# Patient Record
Sex: Female | Born: 1935
Health system: Southern US, Community
[De-identification: ages and names within clinical notes are randomized; demographics above are authoritative.]

## PROBLEM LIST (undated history)

## (undated) DIAGNOSIS — E785 Hyperlipidemia, unspecified: Secondary | ICD-10-CM

## (undated) DIAGNOSIS — M67912 Unspecified disorder of synovium and tendon, left shoulder: Secondary | ICD-10-CM

## (undated) DIAGNOSIS — K643 Fourth degree hemorrhoids: Secondary | ICD-10-CM

## (undated) DIAGNOSIS — H9319 Tinnitus, unspecified ear: Secondary | ICD-10-CM

## (undated) DIAGNOSIS — I10 Essential (primary) hypertension: Secondary | ICD-10-CM

## (undated) DIAGNOSIS — Z86010 Personal history of colonic polyps: Secondary | ICD-10-CM

## (undated) DIAGNOSIS — K219 Gastro-esophageal reflux disease without esophagitis: Secondary | ICD-10-CM

## (undated) DIAGNOSIS — Z85820 Personal history of malignant melanoma of skin: Secondary | ICD-10-CM

## (undated) DIAGNOSIS — K5730 Diverticulosis of large intestine without perforation or abscess without bleeding: Secondary | ICD-10-CM

## (undated) DIAGNOSIS — Z8719 Personal history of other diseases of the digestive system: Secondary | ICD-10-CM

## (undated) DIAGNOSIS — Z974 Presence of external hearing-aid: Secondary | ICD-10-CM

## (undated) DIAGNOSIS — J302 Other seasonal allergic rhinitis: Secondary | ICD-10-CM

## (undated) DIAGNOSIS — F03918 Unspecified dementia, unspecified severity, with other behavioral disturbance: Secondary | ICD-10-CM

## (undated) DIAGNOSIS — Z9889 Other specified postprocedural states: Secondary | ICD-10-CM

## (undated) DIAGNOSIS — Z973 Presence of spectacles and contact lenses: Secondary | ICD-10-CM

## (undated) DIAGNOSIS — Z860101 Personal history of adenomatous and serrated colon polyps: Secondary | ICD-10-CM

## (undated) DIAGNOSIS — J3089 Other allergic rhinitis: Secondary | ICD-10-CM

## (undated) HISTORY — PX: COLONOSCOPY: SHX174

## (undated) HISTORY — DX: Gastro-esophageal reflux disease without esophagitis: K21.9

## (undated) HISTORY — DX: Diverticulosis of large intestine without perforation or abscess without bleeding: K57.30

## (undated) HISTORY — DX: Essential (primary) hypertension: I10

---

## 1997-10-31 ENCOUNTER — Other Ambulatory Visit: Admission: RE | Admit: 1997-10-31 | Discharge: 1997-10-31 | Payer: Self-pay | Admitting: *Deleted

## 1997-11-30 ENCOUNTER — Other Ambulatory Visit: Admission: RE | Admit: 1997-11-30 | Discharge: 1997-11-30 | Payer: Self-pay | Admitting: *Deleted

## 1998-06-19 ENCOUNTER — Encounter: Payer: Self-pay | Admitting: Internal Medicine

## 1998-06-19 ENCOUNTER — Ambulatory Visit (HOSPITAL_COMMUNITY): Admission: RE | Admit: 1998-06-19 | Discharge: 1998-06-19 | Payer: Self-pay | Admitting: Internal Medicine

## 1998-11-04 ENCOUNTER — Other Ambulatory Visit: Admission: RE | Admit: 1998-11-04 | Discharge: 1998-11-04 | Payer: Self-pay | Admitting: *Deleted

## 1999-01-22 ENCOUNTER — Encounter (INDEPENDENT_AMBULATORY_CARE_PROVIDER_SITE_OTHER): Payer: Self-pay

## 1999-01-22 ENCOUNTER — Other Ambulatory Visit: Admission: RE | Admit: 1999-01-22 | Discharge: 1999-01-22 | Payer: Self-pay | Admitting: *Deleted

## 2001-12-10 ENCOUNTER — Emergency Department (HOSPITAL_COMMUNITY): Admission: EM | Admit: 2001-12-10 | Discharge: 2001-12-11 | Payer: Self-pay | Admitting: Emergency Medicine

## 2002-03-08 ENCOUNTER — Other Ambulatory Visit: Admission: RE | Admit: 2002-03-08 | Discharge: 2002-03-08 | Payer: Self-pay | Admitting: *Deleted

## 2003-02-05 ENCOUNTER — Encounter: Admission: RE | Admit: 2003-02-05 | Discharge: 2003-03-26 | Payer: Self-pay | Admitting: Internal Medicine

## 2003-04-23 ENCOUNTER — Encounter: Admission: RE | Admit: 2003-04-23 | Discharge: 2003-04-23 | Payer: Self-pay | Admitting: Orthopedic Surgery

## 2003-05-15 ENCOUNTER — Encounter: Admission: RE | Admit: 2003-05-15 | Discharge: 2003-08-09 | Payer: Self-pay | Admitting: Orthopedic Surgery

## 2003-08-22 ENCOUNTER — Encounter: Admission: RE | Admit: 2003-08-22 | Discharge: 2003-09-21 | Payer: Self-pay | Admitting: Orthopedic Surgery

## 2003-11-26 ENCOUNTER — Ambulatory Visit: Payer: Self-pay | Admitting: Internal Medicine

## 2003-11-30 ENCOUNTER — Ambulatory Visit: Payer: Self-pay | Admitting: Internal Medicine

## 2003-12-21 ENCOUNTER — Ambulatory Visit: Payer: Self-pay | Admitting: Internal Medicine

## 2004-02-05 ENCOUNTER — Ambulatory Visit: Payer: Self-pay | Admitting: Internal Medicine

## 2004-08-27 ENCOUNTER — Ambulatory Visit: Payer: Self-pay | Admitting: Internal Medicine

## 2004-09-08 ENCOUNTER — Ambulatory Visit: Payer: Self-pay | Admitting: Internal Medicine

## 2004-09-16 ENCOUNTER — Encounter: Admission: RE | Admit: 2004-09-16 | Discharge: 2004-10-13 | Payer: Self-pay | Admitting: Internal Medicine

## 2004-11-27 ENCOUNTER — Ambulatory Visit: Payer: Self-pay | Admitting: Internal Medicine

## 2005-03-14 ENCOUNTER — Ambulatory Visit: Payer: Self-pay | Admitting: Family Medicine

## 2005-05-12 ENCOUNTER — Ambulatory Visit: Payer: Self-pay | Admitting: Internal Medicine

## 2005-05-27 ENCOUNTER — Ambulatory Visit: Payer: Self-pay | Admitting: Internal Medicine

## 2005-06-04 ENCOUNTER — Ambulatory Visit: Payer: Self-pay | Admitting: Internal Medicine

## 2005-12-15 ENCOUNTER — Ambulatory Visit: Payer: Self-pay | Admitting: Internal Medicine

## 2005-12-15 LAB — CONVERTED CEMR LAB
ALT: 25 units/L (ref 0–40)
AST: 25 units/L (ref 0–37)
Chol/HDL Ratio, serum: 2.7
Cholesterol: 156 mg/dL (ref 0–200)
Glucose, Bld: 98 mg/dL (ref 70–99)
HDL: 57 mg/dL (ref >39.0)
LDL Cholesterol: 88 mg/dL (ref 0–99)
Triglyceride fasting, serum: 54 mg/dL (ref 0–149)
VLDL: 11 mg/dL (ref 0–40)

## 2006-02-22 ENCOUNTER — Ambulatory Visit: Payer: Self-pay | Admitting: Internal Medicine

## 2006-10-11 ENCOUNTER — Telehealth (INDEPENDENT_AMBULATORY_CARE_PROVIDER_SITE_OTHER): Payer: Self-pay | Admitting: *Deleted

## 2006-10-11 ENCOUNTER — Ambulatory Visit: Payer: Self-pay | Admitting: Internal Medicine

## 2006-10-12 ENCOUNTER — Telehealth (INDEPENDENT_AMBULATORY_CARE_PROVIDER_SITE_OTHER): Payer: Self-pay | Admitting: *Deleted

## 2006-10-14 ENCOUNTER — Telehealth (INDEPENDENT_AMBULATORY_CARE_PROVIDER_SITE_OTHER): Payer: Self-pay | Admitting: *Deleted

## 2006-10-15 ENCOUNTER — Ambulatory Visit: Payer: Self-pay | Admitting: Internal Medicine

## 2006-10-20 ENCOUNTER — Telehealth (INDEPENDENT_AMBULATORY_CARE_PROVIDER_SITE_OTHER): Payer: Self-pay | Admitting: *Deleted

## 2006-10-22 ENCOUNTER — Encounter: Admission: RE | Admit: 2006-10-22 | Discharge: 2006-10-22 | Payer: Self-pay | Admitting: Internal Medicine

## 2006-10-25 ENCOUNTER — Telehealth (INDEPENDENT_AMBULATORY_CARE_PROVIDER_SITE_OTHER): Payer: Self-pay | Admitting: *Deleted

## 2006-10-25 ENCOUNTER — Encounter (INDEPENDENT_AMBULATORY_CARE_PROVIDER_SITE_OTHER): Payer: Self-pay | Admitting: *Deleted

## 2006-10-25 ENCOUNTER — Encounter: Payer: Self-pay | Admitting: Internal Medicine

## 2006-10-29 ENCOUNTER — Encounter: Payer: Self-pay | Admitting: Internal Medicine

## 2006-11-19 ENCOUNTER — Inpatient Hospital Stay (HOSPITAL_COMMUNITY): Admission: RE | Admit: 2006-11-19 | Discharge: 2006-11-21 | Payer: Self-pay | Admitting: Orthopedic Surgery

## 2006-11-19 HISTORY — PX: LUMBAR FUSION: SHX111

## 2007-02-17 ENCOUNTER — Telehealth (INDEPENDENT_AMBULATORY_CARE_PROVIDER_SITE_OTHER): Payer: Self-pay | Admitting: *Deleted

## 2007-02-18 ENCOUNTER — Ambulatory Visit: Payer: Self-pay | Admitting: Internal Medicine

## 2007-02-18 DIAGNOSIS — E785 Hyperlipidemia, unspecified: Secondary | ICD-10-CM | POA: Insufficient documentation

## 2007-02-18 LAB — CONVERTED CEMR LAB
ALT: 32 units/L (ref 0–35)
AST: 29 units/L (ref 0–37)
Glucose, Bld: 98 mg/dL (ref 70–99)
Total CHOL/HDL Ratio: 2.5

## 2007-02-22 ENCOUNTER — Ambulatory Visit: Payer: Self-pay | Admitting: Internal Medicine

## 2007-02-22 DIAGNOSIS — I10 Essential (primary) hypertension: Secondary | ICD-10-CM | POA: Insufficient documentation

## 2007-02-22 LAB — CONVERTED CEMR LAB: HDL goal, serum: 40 mg/dL

## 2007-02-23 ENCOUNTER — Telehealth (INDEPENDENT_AMBULATORY_CARE_PROVIDER_SITE_OTHER): Payer: Self-pay | Admitting: *Deleted

## 2007-04-27 ENCOUNTER — Encounter: Payer: Self-pay | Admitting: Internal Medicine

## 2007-11-16 ENCOUNTER — Ambulatory Visit: Payer: Self-pay | Admitting: Internal Medicine

## 2008-03-07 ENCOUNTER — Telehealth (INDEPENDENT_AMBULATORY_CARE_PROVIDER_SITE_OTHER): Payer: Self-pay | Admitting: *Deleted

## 2008-03-23 ENCOUNTER — Telehealth (INDEPENDENT_AMBULATORY_CARE_PROVIDER_SITE_OTHER): Payer: Self-pay | Admitting: *Deleted

## 2008-04-04 ENCOUNTER — Ambulatory Visit: Payer: Self-pay | Admitting: Internal Medicine

## 2008-04-04 DIAGNOSIS — C4360 Malignant melanoma of unspecified upper limb, including shoulder: Secondary | ICD-10-CM | POA: Insufficient documentation

## 2008-04-04 DIAGNOSIS — R0609 Other forms of dyspnea: Secondary | ICD-10-CM

## 2008-04-04 DIAGNOSIS — R0989 Other specified symptoms and signs involving the circulatory and respiratory systems: Secondary | ICD-10-CM | POA: Insufficient documentation

## 2008-04-04 DIAGNOSIS — IMO0001 Reserved for inherently not codable concepts without codable children: Secondary | ICD-10-CM | POA: Insufficient documentation

## 2008-04-06 ENCOUNTER — Ambulatory Visit: Payer: Self-pay | Admitting: Internal Medicine

## 2008-04-07 ENCOUNTER — Telehealth (INDEPENDENT_AMBULATORY_CARE_PROVIDER_SITE_OTHER): Payer: Self-pay | Admitting: *Deleted

## 2008-04-10 ENCOUNTER — Telehealth (INDEPENDENT_AMBULATORY_CARE_PROVIDER_SITE_OTHER): Payer: Self-pay | Admitting: *Deleted

## 2008-04-11 ENCOUNTER — Ambulatory Visit: Payer: Self-pay

## 2008-04-11 ENCOUNTER — Encounter: Payer: Self-pay | Admitting: Cardiology

## 2008-04-16 ENCOUNTER — Encounter (INDEPENDENT_AMBULATORY_CARE_PROVIDER_SITE_OTHER): Payer: Self-pay | Admitting: *Deleted

## 2008-04-16 LAB — CONVERTED CEMR LAB
Albumin: 4.3 g/dL (ref 3.5–5.2)
Alkaline Phosphatase: 69 units/L (ref 39–117)
Basophils Relative: 1.4 % (ref 0.0–3.0)
CO2: 33 meq/L — ABNORMAL HIGH (ref 19–32)
Chloride: 102 meq/L (ref 96–112)
Creatinine, Ser: 0.8 mg/dL (ref 0.4–1.2)
Eosinophils Relative: 2.9 % (ref 0.0–5.0)
HCT: 37.9 % (ref 36.0–46.0)
HDL: 61.5 mg/dL (ref >39.00)
Hemoglobin: 13 g/dL (ref 12.0–15.0)
LDL Cholesterol: 92 mg/dL (ref 0–99)
Lymphs Abs: 2 10*3/uL (ref 0.7–4.0)
Monocytes Relative: 11.2 % (ref 3.0–12.0)
Neutro Abs: 2.1 10*3/uL (ref 1.4–7.7)
RBC: 4.22 M/uL (ref 3.87–5.11)
Total CHOL/HDL Ratio: 3
Triglycerides: 49 mg/dL (ref 0.0–149.0)
WBC: 4.8 10*3/uL (ref 4.5–10.5)

## 2008-04-27 ENCOUNTER — Encounter: Payer: Self-pay | Admitting: Internal Medicine

## 2008-04-30 ENCOUNTER — Ambulatory Visit: Payer: Self-pay | Admitting: Internal Medicine

## 2008-05-22 ENCOUNTER — Ambulatory Visit: Payer: Self-pay | Admitting: Internal Medicine

## 2008-05-30 ENCOUNTER — Telehealth (INDEPENDENT_AMBULATORY_CARE_PROVIDER_SITE_OTHER): Payer: Self-pay | Admitting: *Deleted

## 2008-07-12 ENCOUNTER — Ambulatory Visit: Payer: Self-pay | Admitting: Internal Medicine

## 2008-07-12 DIAGNOSIS — R259 Unspecified abnormal involuntary movements: Secondary | ICD-10-CM | POA: Insufficient documentation

## 2008-07-12 DIAGNOSIS — IMO0002 Reserved for concepts with insufficient information to code with codable children: Secondary | ICD-10-CM | POA: Insufficient documentation

## 2008-08-02 ENCOUNTER — Ambulatory Visit: Payer: Self-pay | Admitting: Internal Medicine

## 2008-08-09 ENCOUNTER — Ambulatory Visit: Payer: Self-pay | Admitting: Internal Medicine

## 2008-08-09 ENCOUNTER — Encounter: Payer: Self-pay | Admitting: Internal Medicine

## 2008-08-13 ENCOUNTER — Encounter: Payer: Self-pay | Admitting: Internal Medicine

## 2008-10-29 ENCOUNTER — Ambulatory Visit: Payer: Self-pay | Admitting: Family Medicine

## 2008-10-29 DIAGNOSIS — J302 Other seasonal allergic rhinitis: Secondary | ICD-10-CM | POA: Insufficient documentation

## 2008-10-29 DIAGNOSIS — J3089 Other allergic rhinitis: Secondary | ICD-10-CM

## 2008-12-17 ENCOUNTER — Ambulatory Visit: Payer: Self-pay | Admitting: Internal Medicine

## 2008-12-17 DIAGNOSIS — D179 Benign lipomatous neoplasm, unspecified: Secondary | ICD-10-CM | POA: Insufficient documentation

## 2008-12-20 ENCOUNTER — Telehealth (INDEPENDENT_AMBULATORY_CARE_PROVIDER_SITE_OTHER): Payer: Self-pay | Admitting: *Deleted

## 2009-02-05 ENCOUNTER — Ambulatory Visit: Payer: Self-pay | Admitting: Vascular Surgery

## 2009-02-05 ENCOUNTER — Ambulatory Visit (HOSPITAL_COMMUNITY): Admission: RE | Admit: 2009-02-05 | Discharge: 2009-02-05 | Payer: Self-pay | Admitting: Orthopedic Surgery

## 2009-02-22 ENCOUNTER — Telehealth (INDEPENDENT_AMBULATORY_CARE_PROVIDER_SITE_OTHER): Payer: Self-pay | Admitting: *Deleted

## 2009-02-27 ENCOUNTER — Telehealth (INDEPENDENT_AMBULATORY_CARE_PROVIDER_SITE_OTHER): Payer: Self-pay | Admitting: *Deleted

## 2009-04-15 ENCOUNTER — Ambulatory Visit: Payer: Self-pay | Admitting: Internal Medicine

## 2009-04-16 LAB — CONVERTED CEMR LAB
Basophils Relative: 0.6 % (ref 0.0–3.0)
Eosinophils Absolute: 0.1 10*3/uL (ref 0.0–0.7)
Eosinophils Relative: 1.7 % (ref 0.0–5.0)
Hemoglobin: 11.7 g/dL — ABNORMAL LOW (ref 12.0–15.0)
MCHC: 32.1 g/dL (ref 30.0–36.0)
MCV: 92.8 fL (ref 78.0–100.0)
Monocytes Absolute: 0.4 10*3/uL (ref 0.1–1.0)
Neutro Abs: 2.5 10*3/uL (ref 1.4–7.7)
RBC: 3.91 M/uL (ref 3.87–5.11)

## 2009-04-29 ENCOUNTER — Encounter: Payer: Self-pay | Admitting: Internal Medicine

## 2009-05-06 ENCOUNTER — Ambulatory Visit: Payer: Self-pay | Admitting: Internal Medicine

## 2009-05-06 DIAGNOSIS — Z86010 Personal history of colonic polyps: Secondary | ICD-10-CM | POA: Insufficient documentation

## 2009-05-06 DIAGNOSIS — D649 Anemia, unspecified: Secondary | ICD-10-CM | POA: Insufficient documentation

## 2009-05-06 DIAGNOSIS — K5730 Diverticulosis of large intestine without perforation or abscess without bleeding: Secondary | ICD-10-CM | POA: Insufficient documentation

## 2009-05-24 ENCOUNTER — Encounter: Payer: Self-pay | Admitting: Internal Medicine

## 2009-05-28 ENCOUNTER — Ambulatory Visit: Payer: Self-pay | Admitting: Internal Medicine

## 2009-05-28 LAB — CONVERTED CEMR LAB
OCCULT 1: NEGATIVE
OCCULT 2: NEGATIVE
OCCULT 3: NEGATIVE

## 2009-05-29 ENCOUNTER — Encounter (INDEPENDENT_AMBULATORY_CARE_PROVIDER_SITE_OTHER): Payer: Self-pay | Admitting: *Deleted

## 2009-07-02 ENCOUNTER — Ambulatory Visit: Payer: Self-pay | Admitting: Internal Medicine

## 2009-07-05 ENCOUNTER — Encounter: Payer: Self-pay | Admitting: Internal Medicine

## 2009-07-08 LAB — CONVERTED CEMR LAB
Basophils Relative: 0.6 % (ref 0.0–3.0)
Eosinophils Relative: 1.8 % (ref 0.0–5.0)
Lymphocytes Relative: 37.2 % (ref 12.0–46.0)
Monocytes Absolute: 0.5 10*3/uL (ref 0.1–1.0)
Neutrophils Relative %: 48.6 % (ref 43.0–77.0)
Platelets: 242 10*3/uL (ref 150.0–400.0)
RBC: 3.99 M/uL (ref 3.87–5.11)
Vitamin B-12: 278 pg/mL (ref 211–911)
WBC: 4.2 10*3/uL — ABNORMAL LOW (ref 4.5–10.5)

## 2009-07-09 ENCOUNTER — Encounter: Payer: Self-pay | Admitting: Internal Medicine

## 2009-07-29 ENCOUNTER — Ambulatory Visit: Payer: Self-pay | Admitting: Internal Medicine

## 2009-09-09 ENCOUNTER — Telehealth (INDEPENDENT_AMBULATORY_CARE_PROVIDER_SITE_OTHER): Payer: Self-pay | Admitting: *Deleted

## 2010-01-07 ENCOUNTER — Ambulatory Visit: Payer: Self-pay | Admitting: Internal Medicine

## 2010-01-08 LAB — CONVERTED CEMR LAB
Basophils Absolute: 0 10*3/uL (ref 0.0–0.1)
Calcium: 10.2 mg/dL (ref 8.4–10.5)
Eosinophils Relative: 2 % (ref 0.0–5.0)
Folate: 14.8 ng/mL
Iron: 127 ug/dL (ref 42–145)
Lymphocytes Relative: 31.1 % (ref 12.0–46.0)
Lymphs Abs: 1.4 10*3/uL (ref 0.7–4.0)
Magnesium: 2.2 mg/dL (ref 1.5–2.5)
Monocytes Relative: 11.2 % (ref 3.0–12.0)
Neutrophils Relative %: 55.2 % (ref 43.0–77.0)
Platelets: 252 10*3/uL (ref 150.0–400.0)
Potassium: 5 meq/L (ref 3.5–5.1)
RDW: 12.6 % (ref 11.5–14.6)
Saturation Ratios: 29.6 % (ref 20.0–50.0)
Total CK: 53 units/L (ref 7–177)
Transferrin: 306.1 mg/dL (ref 212.0–360.0)
Vitamin B-12: 333 pg/mL (ref 211–911)
WBC: 4.4 10*3/uL — ABNORMAL LOW (ref 4.5–10.5)

## 2010-02-16 LAB — CONVERTED CEMR LAB
ALT: 22 units/L (ref 0–35)
AST: 23 units/L (ref 0–37)
Albumin: 4.3 g/dL (ref 3.5–5.2)
Alkaline Phosphatase: 68 units/L (ref 39–117)
Basophils Relative: 0.8 % (ref 0.0–3.0)
Bilirubin, Direct: 0 mg/dL (ref 0.0–0.3)
CO2: 31 meq/L (ref 19–32)
Calcium: 9.4 mg/dL (ref 8.4–10.5)
Eosinophils Absolute: 0.1 10*3/uL (ref 0.0–0.7)
Eosinophils Relative: 1.7 % (ref 0.0–5.0)
HDL: 60.8 mg/dL (ref >39.00)
Hemoglobin: 12 g/dL (ref 12.0–15.0)
LDL Cholesterol: 78 mg/dL (ref 0–99)
Lymphocytes Relative: 34.1 % (ref 12.0–46.0)
MCHC: 34.8 g/dL (ref 30.0–36.0)
Monocytes Relative: 7.8 % (ref 3.0–12.0)
Neutro Abs: 2 10*3/uL (ref 1.4–7.7)
RBC: 3.85 M/uL — ABNORMAL LOW (ref 3.87–5.11)
Saturation Ratios: 34.9 % (ref 20.0–50.0)
Sodium: 141 meq/L (ref 135–145)
Total CHOL/HDL Ratio: 3
Total Protein: 6.9 g/dL (ref 6.0–8.3)
WBC: 3.6 10*3/uL — ABNORMAL LOW (ref 4.5–10.5)

## 2010-02-18 NOTE — Consult Note (Signed)
Summary: [facility]  [address]   Imported By: Lanelle Bal 06/11/2009 12:26:06  _____________________________________________________________________  External Attachment:    Type:   Image     Comment:   External Document

## 2010-02-18 NOTE — Progress Notes (Signed)
Summary: B/P concerns  Phone Note Call from Patient Call back at Home Phone [phone]   Caller: Patient Summary of Call: Message left on VM: Patient would like a call to discuss B/P med and B/P readings.   I called patient:  Left message on machine for patient to return call when avaliable, Reason for call:  discuss details of her call  .Shonna Chock  February 27, 2009 2:09 PM   Follow-up for Phone Call        [facility] and corrected 5 refills to 3 refills. 5 was sent in error Follow-up by: Shonna Chock,  February 27, 2009 2:51 PM    New/Updated Medications: DIOVAN HCT 160-12.5 MG  TABS (VALSARTAN-HYDROCHLOROTHIAZIDE) 2 by mouth once daily Prescriptions: DIOVAN HCT 160-12.5 MG  TABS (VALSARTAN-HYDROCHLOROTHIAZIDE) 2 by mouth once daily  #180 x 5   Entered by:   Shonna Chock   Authorized by:   Marga Melnick MD   Signed by:   Shonna Chock on 02/27/2009   Method used:   Electronically to        [facility]* (retail)       [address]       [place], [place]  [address]       Ph: [phone]       Fax: [phone]   RxID:   [id number]

## 2010-02-18 NOTE — Letter (Signed)
Summary: Results Follow up Letter  [facility] [place]  [address] [place], [place] [address]   Phone: [phone]  Fax: [phone]    05/29/2009 MRN: [id number]  [facility] [address] [place], [place]  [address]  Dear Vicki Garcia,  The following are the results of your recent test(s):  Test         Result    Pap Smear:        Normal _____  Not Normal _____ Comments: ______________________________________________________ Cholesterol: LDL(Bad cholesterol):         Your goal is less than:         HDL (Good cholesterol):       Your goal is more than: Comments:  ______________________________________________________ Mammogram:        Normal _____  Not Normal _____ Comments:  ___________________________________________________________________ Hemoccult:        Normal __X___  Not normal _______ Comments:    _____________________________________________________________________ Other Tests:    We routinely do not discuss normal results over the telephone.  If you desire a copy of the results, or you have any questions about this information we can discuss them at your next office visit.   Sincerely,

## 2010-02-18 NOTE — Progress Notes (Signed)
Summary: Refill Request  Phone Note Refill Request Message from:  Pharmacy on [facility] Fax # [phone]  Refills Requested: Medication #1:  DIOVAN HCT 160-12.5 MG  TABS 1 by mouth once daily   Dosage confirmed as above?Dosage Confirmed   Last Refilled: 01/03/2009 Initial call taken by: Harold Barban,  February 22, 2009 9:15 AM    Prescriptions: DIOVAN HCT 160-12.5 MG  TABS (VALSARTAN-HYDROCHLOROTHIAZIDE) 1 by mouth once daily  #30 x 5   Entered by:   Shonna Chock   Authorized by:   Marga Melnick MD   Signed by:   Shonna Chock on 02/22/2009   Method used:   Electronically to        [facility]* (retail)       [address]       [place], [place]  [address]       Ph: [phone]       Fax: [phone]   RxID:   [id number]

## 2010-02-18 NOTE — Assessment & Plan Note (Signed)
Summary: CPX- JR   Vital Signs:  Patient profile:   75 year old female Height:      61 inches Weight:      121 pounds Temp:     98.0 degrees F oral Pulse rate:   80 / minute Resp:     20 per minute BP sitting:   142 / 82  (left arm)  Vitals Entered By: Jeremy Johann CMA (May 06, 2009 11:12 AM)  Comments REVIEWED MED LIST, PATIENT AGREED DOSE AND INSTRUCTION CORRECT  pt decrease diovan 160-12.5mg  to 1 tab once daily    History of Present Illness: Vicki Garcia is here for a physical ; preventive measures reviewed . All up to date except  Pneumovax & tetanus. See BP; @ home BP 120/60 -135/80.  Allergies: 1)  ! Codeine  Past History:  Past Medical History: Hyperlipidemia Hypertension Elevated Homocysteine level Skin cancer, hx of, Melanoma RUE 1987; monitored by Dr Dorinda Hill Lumbosacral Radiculopathy in context of Spinal Stenosis Colonic polyps,Diverticulosis, AVM @ colonoscopy 07/2008, Dr Marina Goodell; Stress Test negative 03/2008  Past Surgical History: Lumbar laminectomy 10/2006 for spinal stenosis, Dr Shon Baton, [facility] Rotator cuff repair 2005 Melanoma RUE 1987; G2 P1 M1; D&C X2; OD hemorrhage ,S/P injections @ [facility] Colon polypectomy 2010  Family History: Father: CAD; Mother: NHL; Siblings: bro:  HTN, skin cancer,renal  cancer , cns bleed; bro :DDD,HTN, prostate cancer; P aunt:  breast cancer ;P uncle: MI @ 36  Social History: No diet Former Smoker: quit 1971 Alcohol use-yes: socially Regular exercise: Runner, 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         Chronic tinnitus for > 10 years. Occasional non  exertional  "shooting chest pain " ; negative Stress Test 2010. Dietary induced dyspepsia responsive to TUMS or Zantac OTC.  Physical Exam  General:  well-nourished,appears younger than age; alert,appropriate and cooperative throughout examination Head:  Normocephalic and atraumatic without obvious abnormalities.  Eyes:  No corneal or conjunctival inflammation noted. Perrla. Funduscopic exam benign, without hemorrhages, exudates or papilledema.  Ears:  External ear exam shows no significant lesions or deformities.  Otoscopic examination reveals clear canals, tympanic membranes are intact bilaterally without bulging, retraction, inflammation or discharge. Hearing is grossly normal bilaterally.Some wax on R Nose:  External nasal examination shows no deformity or inflammation. Nasal mucosa are pink and moist without lesions or exudates. Mouth:  Oral mucosa and oropharynx without lesions or exudates.  Teeth in good repair. Neck:  No deformities, masses, or tenderness noted. Lungs:  Normal respiratory effort, chest expands symmetrically. Lungs are clear to auscultation, no crackles or wheezes. Heart:  Normal rate and regular rhythm. S1 and S2 normal without gallop, murmur, click, rub . S4 Abdomen:  Bowel sounds positive,abdomen soft and non-tender without masses, organomegaly or hernias noted. Aorta palpable w/o AAA Genitalia:  Dr Clearance Coots, Gyn  Msk:  No deformity or scoliosis noted of thoracic or lumbar spine.   Pulses:  R and L carotid,radial,dorsalis pedis and posterior tibial pulses are full and equal bilaterally Extremities:  No clubbing, cyanosis, edema, or deformity noted with normal full range of motion of all joints. Minor OA changes  Neurologic:  alert & oriented X3 and DTRs symmetrical and normal.   Skin:  Intact without suspicious lesions or rashes Cervical Nodes:  No lymphadenopathy noted Axillary Nodes:  No palpable lymphadenopathy Psych:  memory intact for recent and remote, normally  interactive, and good eye contact.     Impression & Recommendations:  Problem # 1:  PREVENTIVE HEALTH CARE (ICD-V70.0)  Orders: Venipuncture (75643) TLB-Lipid Panel (80061-LIPID) TLB-BMP (Basic Metabolic Panel-BMET) (80048-METABOL) TLB-CBC Platelet - w/Differential (85025-CBCD) TLB-Hepatic/Liver Function Pnl (80076-HEPATIC) TLB-TSH (Thyroid Stimulating Hormone) (84443-TSH) TLB-B12 + Folate Pnl (32951_88416-S06/TKZ) TLB-IBC Pnl (Iron/FE;Transferrin) (83550-IBC) EKG w/ Interpretation (93000)  Problem # 2:  ANEMIA, MILD (ICD-285.9)  Borderline 03/2009  Orders: Venipuncture (60109) TLB-CBC Platelet - w/Differential (85025-CBCD) TLB-B12 + Folate Pnl (32355_73220-U54/YHC) TLB-IBC Pnl (Iron/FE;Transferrin) (83550-IBC)  Problem # 3:  HYPERTENSION, ESSENTIAL NOS (ICD-401.9)  Her updated medication list for this problem includes:    Diovan Hct 160-12.5 Mg Tabs (Valsartan-hydrochlorothiazide) .Marland Kitchen... 2 by mouth once daily  Orders: Venipuncture (62376) EKG w/ Interpretation (93000)  Problem # 4:  HYPERLIPIDEMIA (ICD-272.4)  Her updated medication list for this problem includes:    Lipitor 20 Mg Tabs (Atorvastatin calcium) .Marland Kitchen... 1/2 once daily  Orders: Venipuncture (28315) TLB-Lipid Panel (80061-LIPID)  Problem # 5:  COLONIC POLYPS, HX OF (ICD-V12.72) as per Dr Marina Goodell  Problem # 6:  MELANOMA, UPPER ARM (ICD-172.6) as per Dr Mayford Knife  Complete Medication List: 1)  Lipitor 20 Mg Tabs (Atorvastatin calcium) .... 1/2 once daily 2)  Diovan Hct 160-12.5 Mg Tabs (Valsartan-hydrochlorothiazide) .... 2 by mouth once daily 3)  Lorazepam 0.5 Mg Tabs (Lorazepam) .Marland Kitchen.. 1 at bedtime prn 4)  Anusol-hc 2.5 % Crea (Hydrocortisone) .... Apply 1 x daily as needed 5)  Lyrica 75 Mg Caps (Pregabalin) .... Take 1 tab once daily at night  **samples give to patient** 6)  Fluticasone Propionate 50 Mcg/act Susp (Fluticasone propionate) .Marland Kitchen.. 1 spray  two times a day 7)  Singulair 10 Mg Tabs (Montelukast  sodium) .Marland Kitchen.. 1 once daily  Other Orders: Tdap => 75yrs IM (17616) Admin 1st Vaccine (07371)  Patient Instructions: 1)  Check your Blood Pressure regularly. If it is above: 140/90 ON AVERAGE  you should make an appointment. Prescriptions: LIPITOR 20 MG TABS (ATORVASTATIN CALCIUM) 1/2 once daily  #30 x 5   Entered and Authorized by:   Marga Melnick MD   Signed by:   Marga Melnick MD on 05/06/2009   Method used:   Print then Give to Patient   RxID:   [id number] LORAZEPAM 0.5 MG  TABS (LORAZEPAM) 1 at bedtime prn  #30 x 11   Entered and Authorized by:   Marga Melnick MD   Signed by:   Marga Melnick MD on 05/06/2009   Method used:   Print then Give to Patient   RxID:   [id number]    Immunizations Administered:  Tetanus Vaccine:    Vaccine Type: Tdap    Site: right deltoid    Mfr: GlaxoSmithKline    Dose: 0.5 ml    Route: IM    Given by: Chrae Malloy    Exp. Date: 04/13/2011    Lot #: [id number]    VIS given: 12/07/06 version given May 06, 2009.  Appended Document: CPX- JR  Laboratory Results   Urine Tests   Date/Time Reported: May 06, 2009 1:33 PM   Routine Urinalysis   Color: yellow Appearance: Clear Glucose: negative   (Normal Range: Negative) Bilirubin: negative   (Normal Range: Negative) Ketone: negative   (Normal Range: Negative) Spec. Gravity: <1.005   (Normal Range: 1.003-1.035) Blood: negative   (Normal Range:  Negative) pH: 7.0   (Normal Range: 5.0-8.0) Protein: negative   (Normal Range: Negative) Urobilinogen: negative   (Normal Range: 0-1) Nitrite: negative   (Normal Range: Negative) Leukocyte Esterace: negative   (Normal Range: Negative)    Comments: Floydene Flock  May 06, 2009 1:33 PM

## 2010-02-18 NOTE — Assessment & Plan Note (Signed)
Summary: BODY ACHE --PH   Vital Signs:  Patient profile:   75 year old female Weight:      120.8 pounds Temp:     98.1 degrees F oral Pulse rate:   80 / minute Resp:     15 per minute BP sitting:   148 / 76  (left arm) Cuff size:   regular  Vitals Entered By: Shonna Chock (July 29, 2009 12:10 PM) CC: Body Aches , URI symptoms Comments REVIEWED MED LIST, PATIENT AGREED DOSE AND INSTRUCTION CORRECT    CC:  Body Aches  and URI symptoms.  History of Present Illness: Onset as ST 07/27/2009 with myalgias / arthralgias. Rx: nasal spray, Chloraseptic , Advil Decongestant.   The patient reports nasal congestion and dry cough, but denies purulent nasal discharge and earache.  The patient denies fever, dyspnea, wheezing, vomiting, and diarrhea.  The patient also reports headache, muscle aches, and severe fatigue.  Risk factors for Strep sinusitis include bilateral facial pain & dental pain.  The patient denies the following risk factors for Strep sinusitis:  tender adenopathy.    Allergies: 1)  ! Codeine  Physical Exam  General:  in no acute distress; alert,appropriate and cooperative throughout examination Eyes:  No corneal or conjunctival inflammation noted.  Ears:  External ear exam shows no significant lesions or deformities.  Otoscopic examination reveals clear canals, tympanic membranes are intact bilaterally without bulging, retraction, inflammation or discharge. Hearing is grossly normal bilaterally. Nose:  External nasal examination shows no deformity or inflammation. Nasal mucosa are pink and moist without lesions or exudates. Septal dislocation Mouth:  Oral mucosa and oropharynx without lesions or exudates.  Teeth in good repair. Lungs:  Normal respiratory effort, chest expands symmetrically. Lungs are clear to auscultation, no crackles or wheezes. Dry cough Cervical Nodes:  No lymphadenopathy noted Axillary Nodes:  No palpable lymphadenopathy   Impression &  Recommendations:  Problem # 1:  URI (ICD-465.9)  Her updated medication list for this problem includes:    Benzonatate 100 Mg Caps (Benzonatate) .Marland Kitchen... 1 pill every 6 hrs as needed cough  Orders: Prescription Created Electronically [phone])  Problem # 2:  BRONCHITIS-ACUTE (ICD-466.0)  The following medications were removed from the medication list:    Singulair 10 Mg Tabs (Montelukast sodium) .Marland Kitchen... 1 once daily Her updated medication list for this problem includes:    Benzonatate 100 Mg Caps (Benzonatate) .Marland Kitchen... 1 pill every 6 hrs as needed cough    Azithromycin 250 Mg Tabs (Azithromycin) .Marland Kitchen... As per pack  Orders: Prescription Created Electronically [id number])  Complete Medication List: 1)  Lipitor 20 Mg Tabs (Atorvastatin calcium) .... 1/2 once daily 2)  Diovan Hct 160-12.5 Mg Tabs (Valsartan-hydrochlorothiazide) .... 2 by mouth once daily 3)  Lorazepam 0.5 Mg Tabs (Lorazepam) .Marland Kitchen.. 1 at bedtime prn 4)  Anusol-hc 2.5 % Crea (Hydrocortisone) .... Apply 1 x daily as needed 5)  Lyrica 75 Mg Caps (Pregabalin) .... Take 1 tab once daily at night  **samples give to patient** 6)  Fluticasone Propionate 50 Mcg/act Susp (Fluticasone propionate) .Marland Kitchen.. 1 spray  two times a day 7)  Benzonatate 100 Mg Caps (Benzonatate) .Marland Kitchen.. 1 pill every 6 hrs as needed cough 8)  Azithromycin 250 Mg Tabs (Azithromycin) .... As per pack  Patient Instructions: 1)  Drink as much fluid as you can tolerate for the next few days. Prescriptions: AZITHROMYCIN 250 MG TABS (AZITHROMYCIN) as per pack  #1 x 0   Entered and Authorized by:   Marga Melnick MD  Signed by:   Marga Melnick MD on 07/29/2009   Method used:   Faxed to ...       [facility]* (retail)       [address]       [place], [place]  [address]       Ph: [phone]       Fax: [phone]   RxID:   [id number] BENZONATATE 100 MG CAPS (BENZONATATE) 1 pill every 6 hrs as needed cough  #15 x 0   Entered and Authorized by:   Marga Melnick MD   Signed by:   Marga Melnick MD on 07/29/2009   Method used:   Faxed to ...       [facility]* (retail)       [address]       [place], [place]  [address]       Ph: [phone]       Fax: [phone]   RxID:   [id number]

## 2010-02-18 NOTE — Assessment & Plan Note (Signed)
Summary: COUGHING/SORE THORT/CONGESTION/KDC   Vital Signs:  Patient profile:   75 year old female Weight:      122.8 pounds BMI:     23.29 Temp:     99.1 degrees F oral Pulse rate:   72 / minute Resp:     15 per minute BP sitting:   120 / 80  (left arm) Cuff size:   regular  Vitals Entered By: Shonna Chock (April 15, 2009 10:58 AM) CC: Cough, congestion and sore throat off/on x several weeks  Comments REVIEWED MED LIST, PATIENT AGREED DOSE AND INSTRUCTION CORRECT    CC:  Cough and congestion and sore throat off/on x several weeks .  History of Present Illness: Intermittent sinus congestion with burning PNDrainage for 4 months. Rx: Advil Congestion & Claritin  with some benefit.  Allergies: 1)  ! Codeine  Review of Systems General:  Denies chills, fever, sweats, and weight loss. ENT:  Complains of nasal congestion, postnasal drainage, and sinus pressure; Frontal headache , facial pain w/o purulence. Resp:  Denies cough and sputum productive. Allergy:  Complains of itching eyes, seasonal allergies, and sneezing.  Physical Exam  General:  in no acute distress; alert,appropriate and cooperative throughout examination Eyes:  No corneal or conjunctival inflammation noted. EOMI. Perrla.  Vision grossly normal. Ears:  L ear normal.  Wax on R Nose:  External nasal examination shows no deformity or inflammation. Nasal mucosa are pink and moist without lesions or exudates. Mouth:  Oral mucosa and oropharynx without lesions or exudates.  Teeth in good repair. Lungs:  Normal respiratory effort, chest expands symmetrically. Lungs are clear to auscultation, no crackles or wheezes. Cervical Nodes:  No lymphadenopathy noted Axillary Nodes:  No palpable lymphadenopathy   Impression & Recommendations:  Problem # 1:  RHINITIS (ICD-477.9)  R/O subclinical sinusitis The following medications were removed from the medication list:    Nasonex 50 Mcg/act Susp (Mometasone furoate) .Marland Kitchen... 2  sprays each nostril once daily Her updated medication list for this problem includes:    Fluticasone Propionate 50 Mcg/act Susp (Fluticasone propionate) .Marland Kitchen... 1 spray  two times a day  Orders: Venipuncture (36644) TLB-CBC Platelet - w/Differential (85025-CBCD)  Complete Medication List: 1)  Lipitor 20 Mg Tabs (Atorvastatin calcium) .... 1/2 tab once daily- labs due for additional refills 2)  Diovan Hct 160-12.5 Mg Tabs (Valsartan-hydrochlorothiazide) .... 2 by mouth once daily 3)  Lorazepam 0.5 Mg Tabs (Lorazepam) .Marland Kitchen.. 1 at bedtime prn 4)  Anusol-hc 2.5 % Crea (Hydrocortisone) .... Apply 1 x daily as needed 5)  Gabapentin 100 Mg Caps (Gabapentin) .Marland Kitchen.. 1-3 at bedtime as needed 6)  Fluticasone Propionate 50 Mcg/act Susp (Fluticasone propionate) .Marland Kitchen.. 1 spray  two times a day 7)  Singulair 10 Mg Tabs (Montelukast sodium) .Marland Kitchen.. 1 once daily  Patient Instructions: 1)  Neti pot once daily until sinuses clear. 2)  Drink as much fluid as you can tolerate for the next few days. Fluticasone two times a day as "crossover " technique. Report pain , pus & fever as discussed. Fill Rx for Amox 500 mg three times a day if these occur. Prescriptions: SINGULAIR 10 MG TABS (MONTELUKAST SODIUM) 1 once daily  #30 x 5   Entered and Authorized by:   Marga Melnick MD   Signed by:   Marga Melnick MD on 04/15/2009   Method used:   Historical   RxID:   [id number] FLUTICASONE PROPIONATE 50 MCG/ACT SUSP (FLUTICASONE PROPIONATE) 1 spray  two times a day  #1 x  5   Entered and Authorized by:   Marga Melnick MD   Signed by:   Marga Melnick MD on 04/15/2009   Method used:   Faxed to ...       [facility]* (retail)       [address]       [place], [place]  [address]       Ph: [phone]       Fax: [phone]   RxID:   [id number]   Appended Document: Orders Update     Clinical Lists Changes  Orders: Added new Service order of Rapid Strep [id number]) -  Signed Observations: Added new observation of RAPID STREP: negative (04/15/2009 11:24)      Laboratory Results    Other Tests  Rapid Strep: negative  Dr.Hopper aware./Chrae [facility]  April 15, 2009 11:47 AM

## 2010-02-18 NOTE — Progress Notes (Signed)
Summary: Refill Request   Phone Note Refill Request Call back at Home Phone [phone] Message from:  Patient  Refills Requested: Medication #1:  gabapentin 1-2 q 8 hrs as needed pain [place]...Marland KitchenMarland KitchenFelecia Deloach CMA  September 09, 2009 4:49 PM    Follow-up for Phone Call        Left message on machine for patient to return call when avaliable, Reason for call:   Discuss refill request (not on med list) med was changed to Lyrica Follow-up by: Shonna Chock CMA,  September 09, 2009 4:51 PM  Additional Follow-up for Phone Call Additional follow up Details #1::        I spoke with patient and she said both meds work for her and she is ok with either being sent in and was a little concerned as to why Dr.Hopper changed from the Gabapentin on 05/06/2009  Dr.Hopper please advise Additional Follow-up by: Shonna Chock CMA,  September 10, 2009 8:29 AM    Additional Follow-up for Phone Call Additional follow up Details #2::    Per Dr.Hopper ok to fill Gabapentin for it will be cheaper (if it works)  I called and informed patient's husband med sent to pharmacy./Chrae [facility] CMA  September 10, 2009 1:52 PM   New/Updated Medications: GABAPENTIN 100 MG CAPS (GABAPENTIN) 1-3 by mouth at bedtime as needed Prescriptions: GABAPENTIN 100 MG CAPS (GABAPENTIN) 1-3 by mouth at bedtime as needed  #90 x 5   Entered by:   Shonna Chock CMA   Authorized by:   Marga Melnick MD   Signed by:   Shonna Chock CMA on 09/10/2009   Method used:   Electronically to        [facility]* (retail)       [address]       [place], [place]  [address]       Ph: [phone]       Fax: [phone]   RxID:   [id number]

## 2010-02-20 NOTE — Assessment & Plan Note (Signed)
Summary: DISCUSS MED CHANGE/LABS NEEDED/KB   Vital Signs:  Patient profile:   75 year old female Weight:      118.8 pounds BMI:     22.53 Temp:     98.0 degrees F oral Pulse rate:   72 / minute Resp:     14 per minute BP sitting:   112 / 78  (left arm) Cuff size:   large  Vitals Entered By: Shonna Chock CMA (January 07, 2010 10:28 AM) CC: 1.) Discuss med(Gabapentin) and refill Anusol   2.) Labs due, Lower Extremity Joint pain   CC:  1.) Discuss med(Gabapentin) and refill Anusol   2.) Labs due and Lower Extremity Joint pain.  History of Present Illness:      This is a 75 year old woman who presents with Lower Extremity "twitching "  The patient denies swelling, redness, painor weakness.  The pain is located in the left calf > R.It is  intermittent  and occuring at rest, mainly overnight.No definite RLS symptoms.  Gabapentin even titrated up to 300 mg at bedtime not helping, initially it did help.PMH of Spinal Stenosis; symptoms began after surgery.  Current Medications (verified): 1)  Lipitor 20 Mg Tabs (Atorvastatin Calcium) .... 1/2 Once Daily 2)  Diovan Hct 160-12.5 Mg  Tabs (Valsartan-Hydrochlorothiazide) .... 2 By Mouth Once Daily 3)  Lorazepam 0.5 Mg  Tabs (Lorazepam) .Marland Kitchen.. 1 At Bedtime Prn 4)  Anusol-Hc 2.5 % Crea (Hydrocortisone) .... Apply 1 X Daily As Needed 5)  Gabapentin 100 Mg Caps (Gabapentin) .Marland Kitchen.. 1-3 By Mouth At Bedtime As Needed 6)  Fluticasone Propionate 50 Mcg/act Susp (Fluticasone Propionate) .Marland Kitchen.. 1 Spray  Two Times A Day  Allergies: 1)  ! Codeine  Review of Systems CV:  Denies leg cramps with exertion.  Physical Exam  General:  in no acute distress; alert,appropriate and cooperative throughout examination Abdomen:  Bowel sounds positive,abdomen soft and non-tender without masses, organomegaly or hernias noted. Pulses:  R and L dorsalis pedis and posterior tibial pulses are  decreased but equal bilaterally. No ischemic changes Extremities:  No clubbing,  cyanosis, edema. Neg Homan's Neurologic:  alert & oriented X3, strength normal in  lower extremities, and DTRs symmetrical and normal.Neg SLR to 90  degrees   Skin:  Intact without suspicious lesions or rashes Cervical Nodes:  No lymphadenopathy noted Axillary Nodes:  No palpable lymphadenopathy   Impression & Recommendations:  Problem # 1:  TWITCHING (ICD-781.0)  Orders: Venipuncture (21308) TLB-Calcium (82310-CA) TLB-Potassium (K+) (84132-K) TLB-Magnesium (Mg) (83735-MG) TLB-CK Total Only(Creatine Kinase/CPK) (82550-CK)  Problem # 2:  ANEMIA, MILD (ICD-285.9)  Orders: Venipuncture (65784) TLB-B12 + Folate Pnl (69629_52841-L24/MWN) TLB-IBC Pnl (Iron/FE;Transferrin) (83550-IBC) TLB-CBC Platelet - w/Differential (85025-CBCD)  Complete Medication List: 1)  Lipitor 20 Mg Tabs (Atorvastatin calcium) .... 1/2 once daily 2)  Diovan Hct 160-12.5 Mg Tabs (Valsartan-hydrochlorothiazide) .... 2 by mouth once daily 3)  Anusol-hc 2.5 % Crea (Hydrocortisone) .... Apply 1 x daily as needed 4)  Gabapentin 100 Mg Caps (Gabapentin) .Marland Kitchen.. 1-3 by mouth at bedtime as needed 5)  Fluticasone Propionate 50 Mcg/act Susp (Fluticasone propionate) .Marland Kitchen.. 1 spray  two times a day 6)  Clonazepam 0.5 Mg Tabs (Clonazepam) .Marland Kitchen.. 1-2 at bedtime as needed  Patient Instructions: 1)  assess response of muscle symptoms  to Clonazepam trial Prescriptions: CLONAZEPAM 0.5 MG TABS (CLONAZEPAM) 1-2 at bedtime as needed  #30 x 2   Entered and Authorized by:   Marga Melnick MD   Signed by:   Marga Melnick MD  on 01/07/2010   Method used:   Print then Give to Patient   RxID:   [id number]    Orders Added: 1)  Venipuncture [36415] 2)  TLB-B12 + Folate Pnl [82746_82607-B12/FOL] 3)  TLB-IBC Pnl (Iron/FE;Transferrin) [83550-IBC] 4)  TLB-CBC Platelet - w/Differential [85025-CBCD] 5)  TLB-Calcium [82310-CA] 6)  TLB-Potassium (K+) [84132-K] 7)  TLB-Magnesium (Mg) [83735-MG] 8)  TLB-CK Total Only(Creatine  Kinase/CPK) [82550-CK] 9)  Est. Patient Level III [13244]  Appended Document: DISCUSS MED CHANGE/LABS NEEDED/KB   Immunizations Administered:  Influenza Vaccine # 1:    Vaccine Type: Fluvax MCR    Site: right deltoid    Mfr: Sanofi Pasteur    Dose: 0.5 ml    Route: IM    Given by: Shonna Chock CMA    Exp. Date: 07/19/2010    Lot #: [id number]   Appended Document: DISCUSS MED CHANGE/LABS NEEDED/KB

## 2010-03-04 ENCOUNTER — Encounter: Payer: Self-pay | Admitting: Internal Medicine

## 2010-03-17 ENCOUNTER — Telehealth: Payer: Self-pay | Admitting: Internal Medicine

## 2010-03-27 NOTE — Medication Information (Signed)
Summary: Exception Request for Diovan Hct  Exception Request for Diovan Hct   Imported By: Maryln Gottron 03/18/2010 09:58:09  _____________________________________________________________________  External Attachment:    Type:   Image     Comment:   External Document

## 2010-03-27 NOTE — Progress Notes (Signed)
Summary: Leg twitching  Phone Note Call from Patient Call back at Home Phone [phone]   Summary of Call: Patient called noting that she has a problem with her leg twitching once weekly with the Clonazepam given by MD. She notes that things are ok, but it is not totally taking care of the problem.  Any recommendations?  Patient also asked about the coverage of her Diovan. She notes that prior authorization was needed previously for this prescription. I made her aware that pharmacy notifies me of rejected claim if prior authorization is needed. She is aware not to worry as long as it is filling fine at the pharmacy.  Initial call taken by: Lucious Groves CMA,  March 17, 2010 4:06 PM  Follow-up for Phone Call        if Clonazepam 1-2  at bedtime not controlloing leg symptoms, add Cal/Mag ( calcium & magnesium) each evening as needed  Follow-up by: Marga Melnick MD,  March 17, 2010 5:24 PM  Additional Follow-up for Phone Call Additional follow up Details #1::        Patient notified. Additional Follow-up by: Lucious Groves CMA,  March 18, 2010 8:37 AM

## 2010-04-10 ENCOUNTER — Other Ambulatory Visit: Payer: Self-pay | Admitting: Internal Medicine

## 2010-04-10 NOTE — Telephone Encounter (Signed)
OK X 3 months 

## 2010-04-10 NOTE — Telephone Encounter (Signed)
Renew x3 months

## 2010-05-12 ENCOUNTER — Other Ambulatory Visit: Payer: Self-pay | Admitting: *Deleted

## 2010-05-12 MED ORDER — ATORVASTATIN CALCIUM 20 MG PO TABS: ORAL_TABLET | ORAL | Status: DC

## 2010-05-12 NOTE — Telephone Encounter (Signed)
Lipid/Hep 272.4/995.20  

## 2010-05-14 ENCOUNTER — Telehealth: Payer: Self-pay | Admitting: *Deleted

## 2010-05-14 NOTE — Telephone Encounter (Signed)
There will be no difference in the generic Lipitor other than the  coating. If there is a significant  cost difference, I definitely would get with the generic.

## 2010-05-14 NOTE — Telephone Encounter (Signed)
Spoke w/ pt aware of information.  

## 2010-05-14 NOTE — Telephone Encounter (Signed)
Pt called noting that she received notice from her ins company that beginning 06/20/10 she will need to take Generic Lipitor. Pt would like to know if this is ok or is there any reason why she would need to take the brand name. Please advise.

## 2010-05-16 ENCOUNTER — Encounter: Payer: Self-pay | Admitting: Internal Medicine

## 2010-05-29 ENCOUNTER — Telehealth: Payer: Self-pay | Admitting: *Deleted

## 2010-05-29 NOTE — Telephone Encounter (Signed)
Spoke w/ pt says she is still w/ leg twitching at bedtime is almost happening every night was recommended to add cal/mag previously and has been doing this w/ no relief. Also is due for fasting labs please provide order.

## 2010-05-30 MED ORDER — CLONAZEPAM 0.5 MG PO TABS: 1.0000 mg | ORAL_TABLET | Freq: Every day | ORAL | Status: DC

## 2010-05-30 MED ORDER — CLONAZEPAM 1 MG PO TABS: 1.0000 mg | ORAL_TABLET | Freq: Every day | ORAL | Status: DC

## 2010-05-30 NOTE — Telephone Encounter (Signed)
Per Hop increase Clonazepam 1 mg at bedtime rx sent to pharmacy.

## 2010-06-03 NOTE — Op Note (Signed)
NAME:  JAYLIANI, Vicki Garcia                ACCOUNT NO.:  [id number]   MEDICAL RECORD NO.:  [id number]          PATIENT TYPE:  INP   LOCATION:  2899                         FACILITY:  [facility]   PHYSICIAN:  Alvy Beal, MD    DATE OF BIRTH:  1935/02/27   DATE OF PROCEDURE:  11/19/2006  DATE OF DISCHARGE:                               OPERATIVE REPORT   HISTORY:  She is a very pleasant 75 year old woman with complaints of  significant back and bilateral leg and buttock pain, left side worse  than the right.  Preoperative clinical and radiographic analysis  confirmed the diagnosis of symptomatic lumbar spinal stenosis at L4-5  with neurogenic claudication.  Attempts at conservative management in  the past have failed to alleviate her symptoms and so she wished to  proceed with surgery.  All appropriate risks, benefits and alternatives  to surgery were discussed with the patient and consent was obtained.   OPERATIVE NOTE:  The patient is brought to the operating room, placed  supine on the operating table.  After successful induction of general  anesthesia and endotracheal intubation, TEDs, SCDs and Foley were  applied.  She was turned prone onto a Wilson frame.  The arms were  placed overhead and all bony prominences were well-padded.   The back was prepped and draped in a standard fashion.  Two 18-gauge  needles were placed in the lumbar spine and initial incisional x-ray was  taken.  When we confirmed the L4 and L5 spinous process locations, I  then made an incision in the midline of the posterior spine.  Sharp  dissection was carried out down to and through the deep fascia.  Using a  Cobb elevator, I stripped the paraspinal muscles off of the L4 and L5  spinous processes to expose the 4-5 facet complex.  Care was taken not  to violate the facet capsule, nor did I require lateral dissection to  it.  Once we had bilateral exposure of the 4-5 interbody space, I then  placed a Penfield-4  underneath the lamina of L4 and took an x-ray.  At  this point I was able to confirm that I was at the appropriate level.  At this point using a Leksell rongeur I resected the majority of the L4  spinous process and a portion of that of L5.  I then began dissecting  down and resecting the lamina using a 3-mm Kerrison.  I then developed a  plane through the central raphe of the ligamentum flavum, between the  ligamentum flavum and the underlying dura.  I resected centrally the  ligamentum flavum.  There was significant canal compromise due to the  thickening of the ligamentum flavum.  Once I had a central  decompression, I then developed a plane with a Penfield-4 between dura  and overlying thickened/ buckled ligamentum flavum in the lateral  recess.  I then resected this with a combination of 2 and 3-mm  Kerrisons.  At this point I was able to visualize the inferior portion  of the L4 pedicle.  I then proceeded down the  lateral recess towards the  L5 pedicle.  I identified the L5 nerve and the L5 pedicle.  At this  point I had a complete lateral recess decompression from the L4 neural  foramen to the L5 neural foramen.  This encompassed the area of maximal  spinal stenosis on her preoperative MRI.  I then went to the  contralateral side and did a similar decompression on the other side.  At this point I had an excellent decompression of the central and  lateral recess and neural foramen.  I was able to take a [facility] and pass it superiorly in the central region, inferiorly in the  central region, and then along the lateral gutter and out the L5 neural  foramen bilaterally, and up to the L4 neural foramen bilaterally without  difficulty.  I then used a bipolar electrocautery to obtain hemostasis  and maintained it using FloSeal.  I then closed the wound with  interrupted #1 Vicryl sutures, 2-0 Vicryl sutures, and a 3-0 Monocryl.  At the end of the case all  needle and sponge  counts were correct.  Dry dressing was applied.  The  patient was extubated and transferred to the PACU without incident.  It  should be noted that we had satisfactory decompression based on the  ability to pass the [facility] elevator circumferentially in all directions  without hindrance.      Alvy Beal, MD  Electronically Signed     DDB/MEDQ  D:  11/19/2006  T:  11/20/2006  Job:  [id number]

## 2010-06-06 NOTE — Discharge Summary (Signed)
NAME:  Vicki Garcia, Vicki Garcia                ACCOUNT NO.:  [id number]   MEDICAL RECORD NO.:  [id number]          PATIENT TYPE:  INP   LOCATION:  5003                         FACILITY:  [facility]   PHYSICIAN:  Crissie Reese, PA   DATE OF BIRTH:  01-Nov-1935   DATE OF ADMISSION:  11/19/2006  DATE OF DISCHARGE:  11/21/2006                               DISCHARGE SUMMARY   ADMISSION DIAGNOSIS:  Lumbar spinal stenosis.   DISCHARGE DIAGNOSIS:  Lumbar spinal stenosis, status post decompression  at the L4-5 level.   [name] is a very pleasant 75 year old woman, who presented to Dr.  Shon Baton' office with a significant history of low back pain with severe  left leg pain after failing a course of conservative therapy, which  included pain, medical management, physical therapy, epidural steroid  injections. She still was not improving and the decision was made to  undergo a lumbar decompression surgery to relieve the pain in her low  back and more specifically down her left leg. She was consented and  agreed to surgery. The risks and benefits were outlined for the patient.  The patient was brought back to the operating room on 10/31, where she  underwent an L4-L5 decompressive surgery. Please see Dr. Shon Baton'  operative note for further information. The patient tolerated the  surgery very well, was transferred to the PACU in stable condition. Was  later transferred to the ortho floor in stable condition. The patient  came off of her PCA postop day number one. The patient was ambulating  with assistance in the hallway. The patient had no complications during  her hospital stay. The patient was discharged home on 11/21/2006.  Preprinted discharge instructions were given to the patient upon her  discharge. All current home medications were continued. She was sent  home on Percocet 10 325 and instructed to follow up in our office in two  weeks.      Crissie Reese, PA     AC/MEDQ  D:  01/03/2007  T:   01/04/2007  Job:  [id number]

## 2010-06-06 NOTE — Assessment & Plan Note (Signed)
[facility]                                 ON-CALL NOTE   DANAYE, SOBH                         MRN:          [id number]  DATE:03/05/2007                            DOB:          March 31, 1935    Patient calling because she has had 4 hours of nausea, vomiting, and  diarrhea.  She is otherwise well.  Advised this is most likely the GI  flu virus that should resolve within 12 to 24 hours.  Tylenol and clear  liquids.  Call p.r.n.     Jeffrey A. Tawanna Cooler, MD  Electronically Signed    JAT/MedQ  DD: 03/05/2007  DT: 03/07/2007  Job #: [id number]

## 2010-06-12 ENCOUNTER — Other Ambulatory Visit: Payer: Self-pay | Admitting: *Deleted

## 2010-06-12 MED ORDER — ATORVASTATIN CALCIUM 20 MG PO TABS: ORAL_TABLET | ORAL | Status: DC

## 2010-06-12 NOTE — Telephone Encounter (Signed)
I spoke w/ pt she scheduled lab work for June. Will send in refill.

## 2010-07-11 ENCOUNTER — Other Ambulatory Visit: Payer: Self-pay | Admitting: Dermatology

## 2010-07-16 ENCOUNTER — Encounter: Payer: Self-pay | Admitting: Internal Medicine

## 2010-07-16 ENCOUNTER — Ambulatory Visit (INDEPENDENT_AMBULATORY_CARE_PROVIDER_SITE_OTHER): Payer: PRIVATE HEALTH INSURANCE | Admitting: Internal Medicine

## 2010-07-16 DIAGNOSIS — E785 Hyperlipidemia, unspecified: Secondary | ICD-10-CM

## 2010-07-16 DIAGNOSIS — Z0000 Encounter for general adult medical examination without abnormal findings: Secondary | ICD-10-CM

## 2010-07-16 DIAGNOSIS — C4360 Malignant melanoma of unspecified upper limb, including shoulder: Secondary | ICD-10-CM

## 2010-07-16 DIAGNOSIS — I10 Essential (primary) hypertension: Secondary | ICD-10-CM

## 2010-07-16 DIAGNOSIS — Z860100 Personal history of colon polyps, unspecified: Secondary | ICD-10-CM

## 2010-07-16 MED ORDER — FLUTICASONE PROPIONATE 50 MCG/ACT NA SUSP: 1.0000 | Freq: Two times a day (BID) | NASAL | Status: DC

## 2010-07-16 MED ORDER — CLONAZEPAM 1 MG PO TABS: 1.0000 mg | ORAL_TABLET | Freq: Every day | ORAL | Status: DC

## 2010-07-16 NOTE — Patient Instructions (Addendum)
Preventive Health Care: Exercise  30-45  minutes a day, 3-4 days a week. Walking is especially valuable in preventing Osteoporosis. Eat a low-fat diet with lots of fruits and vegetables, up to 7-9 servings per day. Consume less than 30 grams of sugar per day from foods & drinks with High Fructose Corn Syrup as #2,3 or #4 on label. Please  schedule fasting Labs : BMET,Lipids, hepatic panel, CBC & dif, TSH; see Diagnoses for Codes.

## 2010-07-16 NOTE — Progress Notes (Signed)
Subjective:    Patient ID: Vicki Garcia, female    DOB: [birth year], 75 y.o.   MRN: [id number]  HPI Medicare Wellness Visit:  The following psychosocial & medical history were reviewed as required by Medicare.   Social history: caffeine: none , alcohol:  4/week ,  tobacco use : quit 1968  & exercise : gym & Zumba.   Home & personal  safety / fall risk: no issues, activities of daily living: no limitations , seatbelt use : yes , and smoke alarm employment : yes .  Power of Attorney/living Will status : in place  Vision ( as recorded per Nurse) & Hearing  evaluation :  Wall chart read @ 6 ft with lenses; hearing decreased to whisper @ 6 ft. Orientation :oriented x3 , memory & recall :good, spelling or math testing: normal,and mood & affect : normal . Depression / anxiety: denied  Travel history :[place] 2006 , immunization status :Shingles & Pneumovax  needed , transfusion history:  no, and preventive health surveillance ( colonoscopies, BMD , etc as per protocol/ SOC): to check with Dr Arlyce Dice, Dental care:  Every 6 mos . Chart reviewed &  Updated. Active issues reviewed & addressed.       Review of Systems Patient reports no  Significant  changes, adenopathy,fever, weight change,  persistant / recurrent hoarseness , swallowing issues, chest pain,palpitations,edema,persistant /recurrent cough, hemoptysis, dyspnea( rest/ exertional/paroxysmal nocturnal), gastrointestinal bleeding(melena, rectal bleeding), abdominal pain, significant heartburn,  GU symptoms(dysuria, hematuria,pyuria, incontinence ), Gyn symptoms(abnormal  bleeding , pain),  syncope, focal weakness, memory loss,numbness & tingling, skin/hair /nail changes,abnormal bruising or bleeding. Suspect Glaucoma as per Dr Charlotte Sanes. Chronic post nasal drainage.Occasional dysphagia with chicken.Recent loose stool & nausea.Chronic tinnitus.     Objective:   Physical Exam Gen.: Healthy and well-nourished in appearance. Alert, appropriate and  cooperative throughout exam.Appears younger than age Head: Normocephalic without obvious abnormalities Eyes: No corneal or conjunctival inflammation noted. Pupils equal round reactive to light and accommodation.  Extraocular motion intact.  Ears: External  ear exam reveals no significant lesions or deformities. Canals clear .TMs normal.Nose: External nasal exam reveals no deformity or inflammation. Nasal mucosa are pink and moist. No lesions or exudates noted.  Mouth: Oral mucosa and oropharynx reveal no lesions or exudates. Teeth in good repair. Neck: No deformities, masses, or tenderness noted. Range of motion &. Thyroid normal. Lungs: Normal respiratory effort; chest expands symmetrically. Lungs are clear to auscultation without rales, wheezes, or increased work of breathing. Heart: Normal rate and rhythm. Normal S1 and S2. No gallop, click, or rub. S4 w/o murmur. Abdomen: Bowel sounds normal; abdomen soft and nontender. No masses, organomegaly or hernias noted. Genitalia: Dr Clearance Coots.                                                                                      Musculoskeletal/extremities: No deformity or scoliosis noted of  the thoracic or lumbar spine but R  Thoracic muscles > L . No clubbing, cyanosis, edema, or deformity noted. Range of motion  normal .Tone & strength  normal.Joints normal. Nail health  good. Vascular: Carotid, radial artery, dorsalis pedis and dorsalis posterior tibial pulses are  full and equal. No bruits present. Neurologic: Alert and oriented x3. Deep tendon reflexes symmetrical and normal.          Skin: Intact without suspicious lesions or rashes. Lymph: No cervical, axillary lymphadenopathy present. Psych: Mood and affect are normal. Normally interactive                                                                                         Assessment & Plan:  #1 Medicare Wellness Exam; criteria met ; data entered #2 Problem List reviewed ; Assessment/  Recommendations made Plan: see Orders

## 2010-07-18 ENCOUNTER — Other Ambulatory Visit: Payer: Self-pay | Admitting: Internal Medicine

## 2010-07-18 ENCOUNTER — Other Ambulatory Visit (INDEPENDENT_AMBULATORY_CARE_PROVIDER_SITE_OTHER): Payer: PRIVATE HEALTH INSURANCE

## 2010-07-18 DIAGNOSIS — C4360 Malignant melanoma of unspecified upper limb, including shoulder: Secondary | ICD-10-CM

## 2010-07-18 DIAGNOSIS — Z86010 Personal history of colonic polyps: Secondary | ICD-10-CM

## 2010-07-18 DIAGNOSIS — E785 Hyperlipidemia, unspecified: Secondary | ICD-10-CM

## 2010-07-18 LAB — HEPATIC FUNCTION PANEL
ALT: 24 U/L (ref 0–35)
AST: 25 U/L (ref 0–37)
Bilirubin, Direct: 0.1 mg/dL (ref 0.0–0.3)
Total Bilirubin: 0.6 mg/dL (ref 0.3–1.2)

## 2010-07-18 LAB — BASIC METABOLIC PANEL
BUN: 15 mg/dL (ref 6–23)
Calcium: 9.2 mg/dL (ref 8.4–10.5)
GFR: 90.05 mL/min (ref >60.00)
Glucose, Bld: 90 mg/dL (ref 70–99)
Sodium: 139 mEq/L (ref 135–145)

## 2010-07-18 LAB — CBC WITH DIFFERENTIAL/PLATELET
Eosinophils Absolute: 0.1 10*3/uL (ref 0.0–0.7)
MCHC: 33.9 g/dL (ref 30.0–36.0)
MCV: 91.2 fl (ref 78.0–100.0)
Monocytes Absolute: 0.4 10*3/uL (ref 0.1–1.0)
Neutrophils Relative %: 51.5 % (ref 43.0–77.0)
Platelets: 250 10*3/uL (ref 150.0–400.0)

## 2010-07-18 LAB — LIPID PANEL: HDL: 65.6 mg/dL (ref >39.00)

## 2010-07-18 LAB — TSH: TSH: 3.2 u[IU]/mL (ref 0.35–5.50)

## 2010-07-20 ENCOUNTER — Encounter: Payer: Self-pay | Admitting: Internal Medicine

## 2010-09-17 ENCOUNTER — Other Ambulatory Visit: Payer: Self-pay | Admitting: Internal Medicine

## 2010-10-29 LAB — BASIC METABOLIC PANEL
CO2: 31
Calcium: 9.8
Creatinine, Ser: 0.6
GFR calc Af Amer: >60
GFR calc non Af Amer: >60
Sodium: 138

## 2010-10-29 LAB — CBC
Hemoglobin: 12.6
MCHC: 34
RBC: 4.08
RDW: 12.4

## 2010-10-29 LAB — TYPE AND SCREEN: ABO/RH(D): B NEG

## 2010-11-11 ENCOUNTER — Other Ambulatory Visit: Payer: Self-pay | Admitting: Neurosurgery

## 2010-11-11 DIAGNOSIS — M549 Dorsalgia, unspecified: Secondary | ICD-10-CM

## 2010-11-24 ENCOUNTER — Ambulatory Visit (INDEPENDENT_AMBULATORY_CARE_PROVIDER_SITE_OTHER): Payer: PRIVATE HEALTH INSURANCE | Admitting: Family Medicine

## 2010-11-24 ENCOUNTER — Encounter: Payer: Self-pay | Admitting: Family Medicine

## 2010-11-24 VITALS — BP 128/68 | HR 75 | Temp 98.1°F | Ht 60.0 in | Wt 120.8 lb

## 2010-11-24 DIAGNOSIS — J320 Chronic maxillary sinusitis: Secondary | ICD-10-CM | POA: Insufficient documentation

## 2010-11-24 DIAGNOSIS — J329 Chronic sinusitis, unspecified: Secondary | ICD-10-CM

## 2010-11-24 MED ORDER — CLARITHROMYCIN ER 500 MG PO TB24: 1000.0000 mg | ORAL_TABLET | Freq: Every day | ORAL | Status: AC

## 2010-11-24 NOTE — Assessment & Plan Note (Signed)
Pt's sxs and PE consistent w/ infxn.  Start abx.  Reviewed supportive care and red flags that should prompt return.  Pt expressed understanding and is in agreement w/ plan.  

## 2010-11-24 NOTE — Patient Instructions (Signed)
Start the Biaxin for the sinus infection- take w/ food. Hold the lipitor while on the antibiotic Drink plenty of fluids Tylenol or ibuprofen as needed for pain or fever REST! Hang in there!

## 2010-11-24 NOTE — Progress Notes (Signed)
  Subjective:    Patient ID: Vicki Garcia, female    DOB: July 10, 1935, 75 y.o.   MRN: [id number]  HPI Congestion- reports sxs have been 'coming and going' for a couple of weeks.  Has been on Claritin w/out relief, using Flonase intermittently.  + nasal congestion, PND, some ear fullness.  + facial pain, tooth pain.  No fevers.  + HA.  No known sick contacts.   Review of Systems For ROS see HPI     Objective:   Physical Exam  Vitals reviewed. Constitutional: She appears well-developed and well-nourished. No distress.  HENT:  Head: Normocephalic and atraumatic.  Right Ear: Tympanic membrane normal.  Left Ear: Tympanic membrane normal.  Nose: Mucosal edema and rhinorrhea present. Right sinus exhibits maxillary sinus tenderness and frontal sinus tenderness. Left sinus exhibits maxillary sinus tenderness and frontal sinus tenderness.  Mouth/Throat: Uvula is midline and mucous membranes are normal. Posterior oropharyngeal erythema present. No oropharyngeal exudate.  Eyes: Conjunctivae and EOM are normal. Pupils are equal, round, and reactive to light.  Neck: Normal range of motion. Neck supple.  Cardiovascular: Normal rate, regular rhythm and normal heart sounds.   Pulmonary/Chest: Effort normal and breath sounds normal. No respiratory distress. She has no wheezes.  Lymphadenopathy:    She has no cervical adenopathy.          Assessment & Plan:

## 2010-11-27 ENCOUNTER — Telehealth: Payer: Self-pay | Admitting: *Deleted

## 2010-11-27 NOTE — Telephone Encounter (Signed)
It is recommended to eat peanut butter, pineapple, or potato chips to neutralize this bad taste.  The medicine is only for 10 days, it would be best to continue w/out switching to avoid abx resistance

## 2010-11-27 NOTE — Telephone Encounter (Signed)
Discuss with patient  

## 2010-11-27 NOTE — Telephone Encounter (Signed)
Pt states that when she take the clarithromycin it leave a metal taste in her mouth. Pt would like to know if med can be change.Please advise

## 2010-12-03 ENCOUNTER — Telehealth: Payer: Self-pay | Admitting: Internal Medicine

## 2010-12-03 NOTE — Telephone Encounter (Signed)
Pt states that this is a old message from pharmacy and she has since talked with our office about the concerns and will continue with med. Pt does note that she has began to have drainage again today but will continue with nasal spray and antihistamine as well as neti pot to see if she can help to resolve this issue. Pt advise to give [place] a call if symptoms do not resolve.

## 2010-12-03 NOTE — Telephone Encounter (Signed)
Pt calling again see note 11-27-10. Pt would like to change med.Please advise

## 2010-12-03 NOTE — Telephone Encounter (Signed)
Today is her last day of tx- she can stop the medicine and doesn't need additional abx at this time

## 2010-12-03 NOTE — Telephone Encounter (Signed)
Left message to call office

## 2010-12-15 ENCOUNTER — Telehealth: Payer: Self-pay | Admitting: Internal Medicine

## 2010-12-15 DIAGNOSIS — J310 Chronic rhinitis: Secondary | ICD-10-CM

## 2010-12-15 NOTE — Telephone Encounter (Signed)
Dr.Hopper please advise 

## 2010-12-15 NOTE — Telephone Encounter (Signed)
Pt aware referral put in.

## 2010-12-15 NOTE — Telephone Encounter (Signed)
Patient has on going issue with drainage - she wants to know if she could be referred to ent

## 2010-12-15 NOTE — Telephone Encounter (Signed)
Yes; Dx: chronic rhinitis

## 2010-12-17 ENCOUNTER — Encounter: Payer: Self-pay | Admitting: Internal Medicine

## 2011-01-07 ENCOUNTER — Ambulatory Visit (INDEPENDENT_AMBULATORY_CARE_PROVIDER_SITE_OTHER): Payer: PRIVATE HEALTH INSURANCE | Admitting: Family

## 2011-01-07 ENCOUNTER — Ambulatory Visit: Payer: PRIVATE HEALTH INSURANCE | Admitting: Family Medicine

## 2011-01-07 ENCOUNTER — Telehealth: Payer: Self-pay

## 2011-01-07 ENCOUNTER — Encounter: Payer: Self-pay | Admitting: Family

## 2011-01-07 ENCOUNTER — Telehealth: Payer: Self-pay | Admitting: Family

## 2011-01-07 ENCOUNTER — Ambulatory Visit (HOSPITAL_BASED_OUTPATIENT_CLINIC_OR_DEPARTMENT_OTHER)
Admission: RE | Admit: 2011-01-07 | Discharge: 2011-01-07 | Disposition: A | Discharge status: Home / Self Care | Payer: No Typology Code available for payment source | Source: Ambulatory Visit | Attending: Family | Admitting: Family

## 2011-01-07 DIAGNOSIS — R1013 Epigastric pain: Secondary | ICD-10-CM

## 2011-01-07 DIAGNOSIS — R079 Chest pain, unspecified: Secondary | ICD-10-CM

## 2011-01-07 LAB — CBC WITH DIFFERENTIAL/PLATELET
HCT: 35.7 % — ABNORMAL LOW (ref 36.0–46.0)
Hemoglobin: 12 g/dL (ref 12.0–15.0)
Lymphocytes Relative: 32 % (ref 12–46)
Lymphs Abs: 1.5 10*3/uL (ref 0.7–4.0)
Monocytes Absolute: 0.5 10*3/uL (ref 0.1–1.0)
Monocytes Relative: 10 % (ref 3–12)
Neutro Abs: 2.8 10*3/uL (ref 1.7–7.7)
Neutrophils Relative %: 56 % (ref 43–77)
RBC: 4.1 MIL/uL (ref 3.87–5.11)

## 2011-01-07 LAB — HEPATIC FUNCTION PANEL
Bilirubin, Direct: 0.1 mg/dL (ref 0.0–0.3)
Indirect Bilirubin: 0.4 mg/dL (ref 0.0–0.9)
Total Bilirubin: 0.5 mg/dL (ref 0.3–1.2)

## 2011-01-07 LAB — LIPASE: Lipase: 41 U/L (ref 0–75)

## 2011-01-07 LAB — AMYLASE: Amylase: 74 U/L (ref 0–105)

## 2011-01-07 MED ORDER — OMEPRAZOLE 40 MG PO CPDR: 40.0000 mg | DELAYED_RELEASE_CAPSULE | Freq: Every day | ORAL | Status: DC

## 2011-01-07 NOTE — Telephone Encounter (Signed)
Message left on voicemail: Patient with acid reflux and would like to be seen today.  I reviewed schedule prior to calling patient to see if any appointments available, there was only 1 open slot with Dr.Tabori at 2:15. I schedule appointment then called patient. Spoke with patient to get details of acid reflux: Patient mentioned last night she had an episode of her chest feeling like it was on fire and patient became nauseated but no chest pain. Patient mentioned that she has chest pain off/on (although not this time) but never seen for it. Patient was asked if she has a family history of heart related issues: patient responded yes.  I then advised patient she should be seen at the ER, to r/u heart or lung related issue. Patient agreed, patient was given instruction to get to [facility], patient ok'd understanding of location and will call back if needed, removed from Dr.Tabori's schedule.

## 2011-01-07 NOTE — Patient Instructions (Addendum)
Please complete your lab work prior to leaving today.  Schedule your ultrasound on the first floor today. (I would like you to complete tonight or tomorrow) Go to ER if you develop fever over 101, worsening pain, or if you are unable to keep down food/liquids. Follow up with Dr. Alwyn Ren in 2 weeks.

## 2011-01-07 NOTE — Progress Notes (Signed)
Subjective:    Patient ID: Lolita Cram, female    DOB: 11-20-1935, 75 y.o.   MRN: [id number]  HPI  Ms.  Nicholls is a 75 yr old female who presents today to discuss severe GERD symptoms.  She reports that she has not had any reflux symptoms in a long time.  Reported the pain as burning.  Occurred while she was sleeping and it woke her from her sleep.  She at at [employer] last night- had a cabbage wrap.  She got up and took 75mg  of Zantac.  Notes symptoms settled down and she went back to sleep.  She had associated nausea with this episode, but no vomitting.  She reports that she had an acid taste in her mouth as well.  Did not have associated shortness of breath. She denies personal hx of CAD, but her older brother has CAD.    Review of Systems See HPI  Past Medical History  Diagnosis Date  . Hyperlipemia   . Hypertension   . Elevated homocysteine   . Skin cancer     Melanoma RUE 1987; monitored by Dr.Harrison Turner  . Lumbosacral radiculopathy     in context of Spinal Stenosis  . History of colonic polyps   . Diverticulosis of colon     AVM @ colonoscopy 07/2008  . Retinal vein occlusion 2005  . Anal fissure     History   Social History  . Marital Status: Married    Spouse Name: N/A    Number of Children: N/A  . Years of Education: N/A   Occupational History  . Not on file.   Social History Main Topics  . Smoking status: Former Smoker    Quit date: 01/19/1969  . Smokeless tobacco: Never Used  . Alcohol Use: Yes     socially  . Drug Use: Not on file  . Sexually Active: Not on file   Other Topics Concern  . Not on file   Social History Narrative   Regular exercise: Strength Training & walking w/o symptoms    Past Surgical History  Procedure Date  . Lumbar laminectomy 10/2006     for spinal stenosis, Dr Shon Baton, [place] [facility]  . Rotator cuff repair 2005  . Melanoma rue 1987  . G2 p1 m1   . Dilation and curettage of uterus      X 2  . Od hemorrhage     S/P   injections @ [facility]  . Polypectomy 2010    Family History  Problem Relation Age of Onset  . Coronary artery disease Father   . Hypertension Brother   . Skin cancer Brother   . Kidney cancer Brother   . Prostate cancer Brother   . Breast cancer Paternal Aunt   . Heart attack Paternal Uncle 42    NHL  . Lymphoma Mother   . Hemolytic uremic syndrome      granddaughter    Allergies  Allergen Reactions  . Codeine     REACTION: violently ill with N&V  . Norvasc (Amlodipine Besylate)     Nausea & vomiting  . Vasotec     cough    Current Outpatient Prescriptions on File Prior to Visit  Medication Sig Dispense Refill  . clonazePAM (KLONOPIN) 1 MG tablet Take 1 tablet (1 mg total) by mouth at bedtime.  30 tablet  5  . fluticasone (FLONASE) 50 MCG/ACT nasal spray Place 1 spray into the nose 2 (two) times daily.  16 g  11  . hydrocortisone (ANUSOL-HC) 2.5 % rectal cream Place 1 application rectally as needed.        Marland Kitchen LIPITOR 20 MG tablet TAKE (1/2) TABLET DAILY.  45 each  2  . valsartan-hydrochlorothiazide (DIOVAN-HCT) 160-12.5 MG per tablet Take 2 tablets by mouth daily.          BP 110/70  Pulse 72  Temp(Src) 98.1 F (36.7 C) (Oral)  Resp 18  Wt 119 lb (53.978 kg)       Objective:   Physical Exam  Constitutional: She appears well-developed and well-nourished.  HENT:  Head: Normocephalic and atraumatic.  Mouth/Throat: No oropharyngeal exudate.  Eyes: Conjunctivae are normal. Pupils are equal, round, and reactive to light.  Cardiovascular: Normal rate and regular rhythm.   No murmur heard. Pulmonary/Chest: Effort normal and breath sounds normal. No respiratory distress. She has no wheezes. She has no rales. She exhibits no tenderness.  Abdominal: Soft. Bowel sounds are normal. She exhibits no distension. There is no tenderness. There is no rebound and no guarding.  Skin: Skin is warm and dry.          Assessment & Plan:

## 2011-01-07 NOTE — Assessment & Plan Note (Signed)
EKG performed today notes NSR without ischemic changes.  I have personally reviewed EKG and compared to prior EKG and it appears unchanged.  Due to Epigastric discomfort, will obtain abd ultrasound to rule out cholecystits, obtain LFT, amylase/lipase and add a PPI.  She is instructed to go the the ED if worsening pain and she verbalizes understanding.

## 2011-01-08 ENCOUNTER — Encounter: Payer: Self-pay | Admitting: Family

## 2011-01-09 ENCOUNTER — Ambulatory Visit: Payer: PRIVATE HEALTH INSURANCE | Admitting: Internal Medicine

## 2011-01-09 DIAGNOSIS — Z0289 Encounter for other administrative examinations: Secondary | ICD-10-CM

## 2011-01-09 NOTE — Telephone Encounter (Signed)
Reviewed lab work and ultrasound is normal. She should call if her symptoms worsen or do not improve and follow up with Dr. Alwyn Ren in 2 weeks.  I left message with her husband for her to call us on Monday- please review above with her when she calls back.

## 2011-01-12 NOTE — Telephone Encounter (Signed)
Call placed to patient at [phone], she was informed per Sandford Craze instructions and has verbalized understanding.

## 2011-02-02 ENCOUNTER — Ambulatory Visit (INDEPENDENT_AMBULATORY_CARE_PROVIDER_SITE_OTHER): Payer: Medicare Other

## 2011-02-02 DIAGNOSIS — Z23 Encounter for immunization: Secondary | ICD-10-CM

## 2011-02-17 ENCOUNTER — Telehealth: Payer: Self-pay | Admitting: Internal Medicine

## 2011-02-17 MED ORDER — CLONAZEPAM 1 MG PO TABS: 1.0000 mg | ORAL_TABLET | Freq: Every day | ORAL | Status: DC

## 2011-02-17 NOTE — Telephone Encounter (Signed)
Refill- clonazepam 1mg  tablet. Qty 30 last fill12.18.12

## 2011-02-17 NOTE — Telephone Encounter (Signed)
RX sent

## 2011-02-27 ENCOUNTER — Telehealth: Payer: Self-pay | Admitting: Internal Medicine

## 2011-02-27 NOTE — Telephone Encounter (Signed)
Stay on clear liquids for 48-72 hours or until bowels are normal.This would include  jello, sherbert (NOT ice cream), Lipton's chicken noodle soup(NOT cream based soups),Gatorade Lite, flat Ginger ale (without High Fructose Corn Syrup),dry toast or crackers, baked potato.No milk , dairy or grease until bowels are formed. Align , a [employer] , daily if stools are loose. Immodium AD for frankly watery stool. Report increasing pain, fever or rectal bleeding . TUMS bid may help legs as well as GI symptoms. Offer sat Clinic if no better. If desired can come here now

## 2011-02-27 NOTE — Telephone Encounter (Signed)
[facility] Triage Call Report Triage Record Num: [id number] Operator: Caswell Corwin Patient Name: Vicki Garcia Call Date & Time: 02/27/2011 12:17:39PM Patient Phone: [phone] PCP: Marga Melnick Patient Gender: Female PCP Fax : [phone] Patient DOB: May 01, 1935 Practice Name: [facility] Reason for Call: Caller: [name]/Patient; PCP: Marga Melnick; CB#: [phone]; ; Call regarding Stomach Cramps, Leg Is Having Spasms. She is on Clonazepam 1.0 mg @ HS and she is laying in bed this AM with Abd pain and she vomited x 1. She is still slightly nauseated. Her L leg is the worst today. Triaged Leg Non-Injury and last voided at 1215 and she is drinking. AFEBRILE. Needs to be seen in 2 weeks. Home care and call back inst given. STATES SHE WANTS HIM TO BE AWARE OF WHAT SHE IS EXPERIENCING AND IS THERE ANYTHING ELSE SHE CAN TAKE ? Protocol(s) Used: Leg Non-Injury Recommended Outcome per Protocol: See Provider within 2 Weeks Reason for Outcome: Abnormal movements in legs at night that interrupt sleep Care Advice: ~ Call provider if symptoms worsen or new symptoms develop. Avoid caffeine (coffee, tea, cola drinks, or chocolate), alcohol, and nicotine (use of tobacco), as use of these substances may worsen symptoms. ~ Analgesic/Antipyretic Advice - NSAIDs: Consider aspirin, ibuprofen, naproxen or ketoprofen for pain or fever as directed on label or by pharmacist/provider. PRECAUTIONS: - If over 51 years of age, should not take longer than 1 week without consulting provider. EXCEPTIONS: - Should not be used if taking blood thinners or have bleeding problems. - Do not use if have history of sensitivity/allergy to any of these medications; or history of cardiovascular, ulcer, kidney, liver disease or diabetes unless approved by provider. - Do not exceed recommended dose or frequency. ~ Restless Legs Care: - Reduce stress if possible by using meditation or yoga. -  Use gentle stretching and massage in the morning and evenings. - Get regular moderate exercise. - Help muscles relax by taking a warm bath. - Use cloth-covered warm or cold compresses, or alternate warm and cold compresses. - Get regular sleep and have regular sleep times.

## 2011-02-27 NOTE — Telephone Encounter (Signed)
Discuss with patient  

## 2011-02-27 NOTE — Telephone Encounter (Signed)
Dr.Hopper please advise 

## 2011-03-27 ENCOUNTER — Other Ambulatory Visit: Payer: Self-pay | Admitting: Internal Medicine

## 2011-03-27 ENCOUNTER — Telehealth: Payer: Self-pay | Admitting: Internal Medicine

## 2011-03-27 DIAGNOSIS — J310 Chronic rhinitis: Secondary | ICD-10-CM

## 2011-03-27 NOTE — Telephone Encounter (Signed)
Spoke with patient, patient aware Dr.Hopper is out of the office. Patient would like to know what Dr.Hopper recommends, patient states if he agrees with her request to see allergist she would like it noted that she will be out of town until 04/06/11  Dr.Hopper please advise

## 2011-03-27 NOTE — Telephone Encounter (Signed)
Patient called & stated she would like a referral to an allergist, as she is not getting any better with the ENT she was referred to   Please call @ [phone]

## 2011-03-27 NOTE — Telephone Encounter (Signed)
Referral made to Dr Maple Hudson; see date requested

## 2011-03-28 ENCOUNTER — Telehealth: Payer: Self-pay | Admitting: Internal Medicine

## 2011-04-13 ENCOUNTER — Telehealth: Payer: Self-pay | Admitting: *Deleted

## 2011-04-13 ENCOUNTER — Encounter: Payer: Self-pay | Admitting: Internal Medicine

## 2011-04-13 ENCOUNTER — Ambulatory Visit (INDEPENDENT_AMBULATORY_CARE_PROVIDER_SITE_OTHER): Payer: Medicare Other | Admitting: Internal Medicine

## 2011-04-13 VITALS — BP 134/80 | HR 84 | Temp 98.6°F | Wt 115.0 lb

## 2011-04-13 DIAGNOSIS — J329 Chronic sinusitis, unspecified: Secondary | ICD-10-CM

## 2011-04-13 DIAGNOSIS — R05 Cough: Secondary | ICD-10-CM

## 2011-04-13 DIAGNOSIS — R059 Cough, unspecified: Secondary | ICD-10-CM

## 2011-04-13 MED ORDER — METRONIDAZOLE 500 MG PO TABS: 500.0000 mg | ORAL_TABLET | Freq: Three times a day (TID) | ORAL | Status: AC

## 2011-04-13 NOTE — Telephone Encounter (Signed)
Pt seen today

## 2011-04-13 NOTE — Patient Instructions (Signed)
Plain Mucinex for thick secretions ;force NON dairy fluids . Use a Neti pot daily as needed for sinus congestion. Nasal cleansing in the shower as discussed. Make sure that all residual soap is removed to prevent irritation.  

## 2011-04-13 NOTE — Telephone Encounter (Signed)
[facility] Triage Call Report Triage Record Num: [id number] Operator: Aundra Millet Patient Name: Vicki Garcia Call Date & Time: 04/13/2011 8:46:03AM Patient Phone: [phone] PCP: Marga Melnick Patient Gender: Female PCP Fax : [phone] Patient DOB: 09/27/1935 Practice Name: [facility] Reason for Call: Caller: [name]/Patient; PCP: Marga Melnick; CB#: [phone]; ; ; Call regarding Cough/Congestion; ' Cough and congestion since 04/09/2011. Has tried Mucinex and Fluticasone NS. No fever. White, yellow productive cough. RN reached See in 24 hrs for productive cough with colored sputum per Cough - Adult protocol -- Appts sched for 315 pm with Dr Alwyn Ren for today Protocol(s) Used: Cough - Adult Recommended Outcome per Protocol: See Provider within 24 hours Reason for Outcome: Productive cough with colored sputum (other than clear or white sputum) Care Advice: ~ Use a cool mist humidifier to moisten air. Be sure to clean according to manufacturer's instructions. Increase fluids to 8-12 eight oz (1.6 to 2.4 liters) glasses per day, half of them to be water. Soups, popsicles, fruit juices, non-caffeinated sodas (unless restricting sodium intake), jello, broths, decaf teas, etc. are all okay. Warm fluids can be soothing. ~ ~ SYMPTOM / CONDITION MANAGEMENT Coughing up mucus or phlegm helps to get rid of an infection. A productive cough should not be stopped. A cough medicine with guaifenesin (Robitussin, Mucinex) can help loosen the mucus. Cough medicine with dextromethorphan (DM) should be avoided. Drinking lots of fluids can help loosen the mucus too, especially warm fluids. ~ 04/13/2011 8:58:12AM Page 1 of 1 [email or web address]

## 2011-04-13 NOTE — Progress Notes (Signed)
  Subjective:    Patient ID: Vicki Garcia, female    DOB: 1935/02/28, 76 y.o.   MRN: [id number]  HPI Respiratory tract infection Onset/symptoms:04/09/11 as head congestion but intermittently for  2 years Exposures (illness/environmental/extrinsic):no Progression of symptoms:to dry cough Treatments/response:Mucinex, Allegra D without benefit Present symptoms: Fever/chills/sweats:chills Frontal headache:yes Facial pain:some Nasal purulence:no Sore throat:slight Dental pain:upper teeth Lymphadenopathy:no Wheezing/shortness of breath:no Cough/sputum/hemoptysis:minimal yellow sputum Associated extrinsic/allergic symptoms:itchy eyes/ sneezing:sneezing Past medical history: Seasonal allergies : no/asthma:no Smoking history:quit 1971           Review of Systems : She has been seen by an otolaryngologist on 3 occasions. She improves with antibiotics only to have recurrences. He  questioned allergic etiology. She has an appointment to see an allergist in one month.  He has not performed sinus CT. She's been prescribed doxycycline and clindamycin.  She denies reflux symptoms or  dysphagia.    Objective:   Physical Exam General appearance:good health ;well nourished; no acute distress or increased work of breathing is present.  No  lymphadenopathy about the head, neck, or axilla noted.   Eyes: No conjunctival inflammation or lid edema is present. EOMI & vision intact  Ears:  External ear exam shows no significant lesions or deformities.  Otoscopic examination reveals clear canals, tympanic membranes are intact bilaterally without bulging, retraction, inflammation or discharge.  Nose:  External nasal examination shows no deformity or inflammation. Nasal mucosa are pink and moist without lesions or exudates. No septal dislocation or deviation.No obstruction to airflow.   Oral exam: Dental hygiene is good; lips and gums are healthy appearing.There is no oropharyngeal erythema or exudate  noted.   Neck:  No deformities, thyromegaly, masses, or tenderness noted.   Heart:  Normal rate and regular rhythm. S1 and S2 normal without gallop, murmur, click, rub or other extra sounds.   Lungs:Chest clear to auscultation; no wheezes, rhonchi,rales ,or rubs present.No increased work of breathing.    Extremities:  No cyanosis, edema, or clubbing  noted    Skin: Warm & dry .          Assessment & Plan:  #1 upper respiratory infection with pharyngitis; recurrent X 2 years # bronchitis , acute w/o bronchospasm Plan: See orders and recommendations. I discussed risks of recurrent antibiotics ,including C difficile,resistent bacterial  or fungal superinfection .

## 2011-04-21 ENCOUNTER — Ambulatory Visit (INDEPENDENT_AMBULATORY_CARE_PROVIDER_SITE_OTHER)
Admission: RE | Admit: 2011-04-21 | Discharge: 2011-04-21 | Disposition: A | Discharge status: Home / Self Care | Payer: Medicare Other | Source: Ambulatory Visit | Attending: Internal Medicine | Admitting: Internal Medicine

## 2011-04-21 DIAGNOSIS — J329 Chronic sinusitis, unspecified: Secondary | ICD-10-CM

## 2011-04-24 ENCOUNTER — Telehealth: Payer: Self-pay | Admitting: Internal Medicine

## 2011-04-24 NOTE — Telephone Encounter (Signed)
Discussed with pt

## 2011-04-24 NOTE — Telephone Encounter (Signed)
I am terribly  sorry to hear this news.  The most appropriate intervention would be to use the clonazepam 1 mg 1/2 every 8 hours as needed in addition to the 1 mg at bedtime. This would prevent possible adverse interaction with any new medication she has not taken previously

## 2011-04-24 NOTE — Telephone Encounter (Signed)
Dr.Hopper please advise 

## 2011-04-24 NOTE — Telephone Encounter (Signed)
Patient called stating her brother, Rosanne Ashing, has just been diagnosed with stage 4 cancer. She is requesting that Dr. Alwyn Ren call her in something for anxiety. She uses [employer]. Call back # [phone]

## 2011-04-30 ENCOUNTER — Ambulatory Visit (INDEPENDENT_AMBULATORY_CARE_PROVIDER_SITE_OTHER): Payer: Medicare Other | Admitting: Internal Medicine

## 2011-04-30 ENCOUNTER — Encounter: Payer: Self-pay | Admitting: Internal Medicine

## 2011-04-30 ENCOUNTER — Other Ambulatory Visit: Payer: Medicare Other

## 2011-04-30 ENCOUNTER — Other Ambulatory Visit: Payer: Self-pay | Admitting: Dermatology

## 2011-04-30 VITALS — BP 108/60 | HR 96 | Ht 61.0 in | Wt 116.4 lb

## 2011-04-30 DIAGNOSIS — J309 Allergic rhinitis, unspecified: Secondary | ICD-10-CM

## 2011-04-30 DIAGNOSIS — J3089 Other allergic rhinitis: Secondary | ICD-10-CM

## 2011-04-30 DIAGNOSIS — J320 Chronic maxillary sinusitis: Secondary | ICD-10-CM

## 2011-04-30 NOTE — Progress Notes (Signed)
04/30/11- 20 yoF referred courtesy of Dr Alwyn Ren for allergy evaluation. She complains of sinus congestion with postnasal drainage "every other month". This has been going on over a year. She has seen Dr. Edd Fabian who treated with antibiotics. These help for a month at a time but then her nose starts to burn, drainage begins and head congestion gets worse. CT scan of the sinuses on 04/21/2011 showed bilateral chronic maxillary sinusitis. She denies any previously recognized allergy problem. 2 years ago black mold was noted around duct work in her home and remediated - symptoms got worse after that. She felt better while traveling in [place]. There has been no chest discomfort. Little sneeze or itch. No problems with  ears. She denies urticaria or atopic dermatitis. There has been no ENT surgery.  Environment- house with remediation of black mold 2 years ago. Dry. 2 dogs in. No smokers. She is married, self-employed, works at home.    Prior to Admission medications   Medication Sig Start Date End Date Taking? Authorizing Provider  calcium gluconate 500 MG tablet Take 500 mg by mouth daily.   Yes Historical Provider, MD  clonazePAM (KLONOPIN) 1 MG tablet Take 1 tablet (1 mg total) by mouth at bedtime. 02/17/11  Yes Pecola Lawless, MD  fluticasone (FLONASE) 50 MCG/ACT nasal spray Place 1 spray into the nose 2 (two) times daily. 07/16/10  Yes Pecola Lawless, MD  hydrocortisone (ANUSOL-HC) 2.5 % rectal cream Place 1 application rectally as needed.     Yes Historical Provider, MD  LIPITOR 20 MG tablet TAKE (1/2) TABLET DAILY. 09/17/10  Yes Pecola Lawless, MD  omeprazole (PRILOSEC) 40 MG capsule Take 1 capsule (40 mg total) by mouth daily. 01/07/11 01/07/12 Yes Sandford Craze, NP  Probiotic Product (PROBIOTIC FORMULA PO) Take 1 capsule by mouth daily as needed.   Yes Historical Provider, MD  valsartan-hydrochlorothiazide (DIOVAN-HCT) 160-12.5 MG per tablet Take 1 tablet by mouth daily.    Yes  Historical Provider, MD   Past Medical History  Diagnosis Date  . Hyperlipemia   . Hypertension   . Elevated homocysteine   . Skin cancer     Melanoma RUE 1987; monitored by Dr.Harrison Turner  . Lumbosacral radiculopathy     in context of Spinal Stenosis  . History of colonic polyps   . Diverticulosis of colon     AVM @ colonoscopy 07/2008  . Retinal vein occlusion 2005  . Anal fissure    History   Social History  . Marital Status: Married    Spouse Name: N/A    Number of Children: N/A  . Years of Education: N/A   Occupational History  . Not on file.   Social History Main Topics  . Smoking status: Former Smoker    Quit date: 01/19/1969  . Smokeless tobacco: Never Used  . Alcohol Use: Yes     socially  . Drug Use: Not on file  . Sexually Active: Not on file   Other Topics Concern  . Not on file   Social History Narrative   Regular exercise: Strength Training & walking w/o symptoms   Family History  Problem Relation Age of Onset  . Coronary artery disease Father   . Hypertension Brother   . Skin cancer Brother   . Kidney cancer Brother   . Prostate cancer Brother   . Breast cancer Paternal Aunt   . Heart attack Paternal Uncle 42    NHL  . Lymphoma Mother   . Hemolytic  uremic syndrome      granddaughter   ROS-see HPI Constitutional:   No-   weight loss, night sweats, fevers, chills, fatigue, lassitude. HEENT:   No-  headaches, difficulty swallowing, tooth/dental problems, sore throat,       No-  sneezing, itching, ear ache,   +nasal congestion, post nasal drip,  CV:  No-   chest pain, orthopnea, PND, swelling in lower extremities, anasarca,  dizziness, palpitations Resp: No-   shortness of breath with exertion or at rest.              No-   productive cough,  No non-productive cough,  No- coughing up of blood.              No-   change in color of mucus.  No- wheezing.   Skin: No-   rash or lesions. GI:  No-   heartburn, indigestion, abdominal pain,  nausea, vomiting,  GU: No-   dysuria,  pain.MS:  No-   joint pain or swelling. . Neuro-     nothing unusual Psych:  No- change in mood or affect. No depression or anxiety.  No memory loss.  OBJ- Physical Exam General- Alert, Oriented, Affect-appropriate, Distress- none acute Skin- rash-none, lesions- none, excoriation- none Lymphadenopathy- none Head- atraumatic            Eyes- Gross vision intact, PERRLA, conjunctivae and secretions clear            Ears- Hearing, canals-normal            Nose- mucus bridging, no-Septal dev,  polyps, erosion, perforation             Throat- Mallampati II , mucosa clear , drainage- none, tonsils- atrophic Neck- flexible , trachea midline, no stridor , thyroid nl, carotid no bruit Chest - symmetrical excursion , unlabored           Heart/CV- RRR , no murmur , no gallop  , no rub, nl s1 s2                           - JVD- none , edema- none, stasis changes- none, varices- none           Lung- clear to P&A, wheeze- none, cough- none , dullness-none, rub- none           Chest wall-  Abd-  Br/ Gen/ Rectal- Not done, not indicated Extrem- cyanosis- none, clubbing, none, atrophy- none, strength- nl Neuro- grossly intact to observation

## 2011-04-30 NOTE — Patient Instructions (Signed)
Order-  Lab- Allergy profile  Dx perennial allergic rhinitis  Recommend- use your Flonase/ fluticasone every day  1-2 sprays each nostril at bedtime.   Use your saline rinse- the bottle or the Neti pot, when you feel trouble starting up again

## 2011-05-04 LAB — ALLERGY FULL PROFILE
Allergen, D pternoyssinus,d7: <0.1 kU/L (ref <0.35)
Allergen,Goose feathers, e70: <0.1 kU/L (ref <0.35)
Alternaria Alternata: <0.1 kU/L (ref <0.35)
Aspergillus fumigatus, IgG: <0.1 kU/L (ref <0.35)
Cat Dander: <0.1 kU/L (ref <0.35)
Common Ragweed: <0.1 kU/L (ref <0.35)
D. farinae: <0.1 kU/L (ref <0.35)
Dog Dander: <0.1 kU/L (ref <0.35)
Helminthosporium halodes: <0.1 kU/L (ref <0.35)
House Dust Hollister: <0.1 kU/L (ref <0.35)
IgE (Immunoglobulin E), Serum: <1.5 IU/mL (ref 0.0–180.0)
Lamb's Quarters: <0.1 kU/L (ref <0.35)
Oak: <0.1 kU/L (ref <0.35)
Plantain: <0.1 kU/L (ref <0.35)
Stemphylium Botryosum: <0.1 kU/L (ref <0.35)
Timothy Grass: <0.1 kU/L (ref <0.35)

## 2011-05-04 NOTE — Assessment & Plan Note (Signed)
We will look for an allergy mechanism as requested. Plan-allergy profile. Educated on daily use of Flonase.

## 2011-05-04 NOTE — Assessment & Plan Note (Signed)
Onset and persistence of a new symptom over the past year strongly suggests a chronic sinus infection. Plan-educated on saline rinse and decongestants.

## 2011-05-07 ENCOUNTER — Telehealth: Payer: Self-pay | Admitting: Internal Medicine

## 2011-05-07 NOTE — Progress Notes (Signed)
Quick Note:  Pt aware of results. ______ 

## 2011-05-07 NOTE — Progress Notes (Signed)
Quick Note:  LMTCB ______ 

## 2011-05-07 NOTE — Telephone Encounter (Signed)
Pt aware of results 

## 2011-05-07 NOTE — Telephone Encounter (Signed)
LMTCB-needs to speak directly with Leam Madero.

## 2011-06-11 ENCOUNTER — Encounter: Payer: Self-pay | Admitting: Internal Medicine

## 2011-06-11 ENCOUNTER — Ambulatory Visit (INDEPENDENT_AMBULATORY_CARE_PROVIDER_SITE_OTHER): Payer: Medicare Other | Admitting: Internal Medicine

## 2011-06-11 VITALS — BP 140/72 | HR 98 | Ht 61.0 in | Wt 115.8 lb

## 2011-06-11 DIAGNOSIS — H6980 Other specified disorders of Eustachian tube, unspecified ear: Secondary | ICD-10-CM

## 2011-06-11 DIAGNOSIS — J309 Allergic rhinitis, unspecified: Secondary | ICD-10-CM

## 2011-06-11 DIAGNOSIS — J320 Chronic maxillary sinusitis: Secondary | ICD-10-CM

## 2011-06-11 DIAGNOSIS — H6990 Unspecified Eustachian tube disorder, unspecified ear: Secondary | ICD-10-CM

## 2011-06-11 MED ORDER — MONTELUKAST SODIUM 10 MG PO TABS: 10.0000 mg | ORAL_TABLET | Freq: Every day | ORAL | Status: DC

## 2011-06-11 MED ORDER — METHYLPREDNISOLONE ACETATE 80 MG/ML IJ SUSP
80.0000 mg | Freq: Once | INTRAMUSCULAR | Status: AC
  Administered 2011-06-11: 80 mg via INTRAMUSCULAR

## 2011-06-11 MED ORDER — PHENYLEPHRINE HCL 1 % NA SOLN: 3.0000 [drp] | Freq: Once | NASAL | Status: AC

## 2011-06-11 NOTE — Progress Notes (Signed)
04/30/11- 12 yoF referred courtesy of Dr Alwyn Ren for allergy evaluation. She complains of sinus congestion with postnasal drainage "every other month". This has been going on over a year. She has seen Dr. Edd Fabian who treated with antibiotics. These help for a month at a time but then her nose starts to burn, drainage begins and head congestion gets worse. CT scan of the sinuses on 04/21/2011 showed bilateral chronic maxillary sinusitis. She denies any previously recognized allergy problem. 2 years ago black mold was noted around duct work in her home and remediated - symptoms got worse after that. She felt better while traveling in [place]. There has been no chest discomfort. Little sneeze or itch. No problems with  ears. She denies urticaria or atopic dermatitis. There has been no ENT surgery. Environment- house with remediation of black mold 2 years ago. Dry. 2 dogs in. No smokers. She is married, self-employed, works at home.  06/11/11- 76 yoF followed for allergic rhinitis Drainage that burns throat, sinus troubles,feels bad all over. She feels that her head is in a drum. Postnasal drip. Denies cough or wheeze.   CT scan of sinuses 04/21/2011-chronic maxillary sinusitis. Images reviewed with her. Allergy Profile  Total IgE < 1.5, negative.   ROS-see HPI Constitutional:   No-   weight loss, night sweats, fevers, chills, fatigue, lassitude. HEENT:   No-  headaches, difficulty swallowing, tooth/dental problems, sore throat,       No-  sneezing, itching, ear ache,   +nasal congestion, post nasal drip,  CV:  No-   chest pain, orthopnea, PND, swelling in lower extremities, anasarca,  dizziness, palpitations Resp: No-   shortness of breath with exertion or at rest.              No-   productive cough,  No non-productive cough,  No- coughing up of blood.              No-   change in color of mucus.  No- wheezing.   Skin: No-   rash or lesions. GI:  No-   heartburn, indigestion, abdominal pain,  nausea, vomiting,  GU:   pain.MS:  No-   joint pain or swelling. . Neuro-     nothing unusual Psych:  No- change in mood or affect. No depression or anxiety.  No memory loss.  OBJ- Physical Exam General- Alert, Oriented, Affect-appropriate, Distress- none acute, Skin- rash-none, lesions- none, excoriation- none Lymphadenopathy- none Head- atraumatic            Eyes- Gross vision intact, PERRLA, conjunctivae and secretions clear            Ears- Hearing, canals-normal. TMs look retracted without fluid.            Nose- clear, no-Septal dev,  polyps, erosion, perforation             Throat- Mallampati II , mucosa clear , drainage- none, tonsils- atrophic Neck- flexible , trachea midline, no stridor , thyroid nl, carotid no bruit Chest - symmetrical excursion , unlabored           Heart/CV- RRR , no murmur , no gallop  , no rub, nl s1 s2                           - JVD- none , edema- none, stasis changes- none, varices- none           Lung- clear to P&A, wheeze- none, cough-  none , dullness-none, rub- none           Chest wall-  Abd-  Br/ Gen/ Rectal- Not done, not indicated Extrem- cyanosis- none, clubbing, none, atrophy- none, strength- nl Neuro- grossly intact to observation

## 2011-06-11 NOTE — Patient Instructions (Signed)
Script sent for montelukast/ Singulair airway anti-inflammatory  Sample Dymista nasal spray   1 or 2 sprays each nostril once daily at bedtime instead of fluticasone. You can go up to twice daily if needed.   Neb neo nasal  Depo 80

## 2011-06-16 DIAGNOSIS — H6990 Unspecified Eustachian tube disorder, unspecified ear: Secondary | ICD-10-CM | POA: Insufficient documentation

## 2011-06-16 DIAGNOSIS — H6980 Other specified disorders of Eustachian tube, unspecified ear: Secondary | ICD-10-CM | POA: Insufficient documentation

## 2011-06-16 NOTE — Assessment & Plan Note (Signed)
Plan-nasal decongestant nebulizer, Depo-Medrol, saline rinse

## 2011-06-16 NOTE — Assessment & Plan Note (Signed)
This is probably secondary to her rhinitis and sinusitis and should respond to treatment directed at them.

## 2011-06-16 NOTE — Assessment & Plan Note (Signed)
Plan-Singulair, sample Dymista nasal spray

## 2011-06-22 ENCOUNTER — Encounter: Payer: Self-pay | Admitting: Internal Medicine

## 2011-06-23 ENCOUNTER — Encounter: Payer: Self-pay | Admitting: Internal Medicine

## 2011-06-25 ENCOUNTER — Encounter: Payer: Self-pay | Admitting: Internal Medicine

## 2011-06-25 ENCOUNTER — Ambulatory Visit (INDEPENDENT_AMBULATORY_CARE_PROVIDER_SITE_OTHER): Payer: Medicare Other | Admitting: Internal Medicine

## 2011-06-25 VITALS — BP 130/78 | HR 74 | Temp 98.7°F | Wt 115.0 lb

## 2011-06-25 DIAGNOSIS — IMO0002 Reserved for concepts with insufficient information to code with codable children: Secondary | ICD-10-CM

## 2011-06-25 MED ORDER — GABAPENTIN 100 MG PO CAPS: ORAL_CAPSULE | ORAL | Status: DC

## 2011-06-25 NOTE — Patient Instructions (Signed)
The best exercises for the low back include freestyle swimming, stretch aerobics, and yoga. 

## 2011-06-25 NOTE — Progress Notes (Signed)
Subjective:    Patient ID: Vicki Garcia, female    DOB: 08-15-1935, 76 y.o.   MRN: [id number]  HPI This weekend while away from home she had "excruciating" discomfort in the left leg from the upper posterior thigh to the left calf. She'll notice radiation over the medial dorsal surface of the left foot with associated drawing of the great toe. Numbness & tingling will follow the pain is also associated with a sense of heaviness in the leg. Prior to this episode clonazepam had been helping she discomfort in the left lower extremity. She does not have significant symptoms in the right leg. Remotely she does have a history of radiculopathy in the context of spinal stenosis for which she had surgery. She also has a history of mild anemia    Review of Systems Constitutional: No fever, chills, significant weight change, fatigue, weakness or night sweats Eyes:  blurred vision, double vision, or loss of vision ENT/mouth:  hearing loss, tinnitus    Musculoskeletal: No  joint stiffness, joint swelling, joint color change, weakness, or cyanosis Dermatologic: No rash or change in color or temperature of skin Neurologic: No  tremor, gait disturbance. Hematologic/lymphatic: No bruising, lymphadenopathy    Objective:   Physical Exam Gen.: Thin but healthy and well-nourished in appearance. Alert, appropriate and cooperative throughout exam. Head: Normocephalic without obvious abnormalities Eyes: No corneal or conjunctival inflammation noted. Extraocular motion intact. Field of Vision grossly normal.  Mouth: Oral mucosa and oropharynx reveal no lesions or exudates. Teeth in good repair. No tongue deviation Neck: No deformities, masses, or tenderness noted. Range of motion normal Lungs: Normal respiratory effort; chest expands symmetrically. Lungs are clear to auscultation without rales, wheezes, or increased work of breathing. Heart: Normal rate and rhythm. Normal S1 and S2. No gallop, click, or rub. No  murmur. Abdomen: Bowel sounds normal; abdomen soft and nontender. No masses, organomegaly or hernias noted. Aorta palpable ; no AAA                                                                                   Musculoskeletal/extremities: No deformity or scoliosis noted of  the thoracic or lumbar spine;but there is some asymmetry of the posterior thoracic musculature suggesting occult scoliosis.  No clubbing, cyanosis, edema, or deformity noted. Range of motion  normal .Tone & strength  normal.Joints normal. Nail health  Good.Negative SLR. Vascular: Carotid, radial artery, dorsalis pedis and  posterior tibial pulses are full and equal. No bruits present. Neurologic: Alert and oriented x3. Deep tendon reflexes symmetrical and normal. Gait normal including heel & toe walking         Skin: Intact without suspicious lesions or rashes. Lymph: No cervical, axillary lymphadenopathy present. Psych: Mood and affect are normal. Normally interactive  Assessment & Plan:   #1 her history sounds classic for left L-5 radicular pain; clinically and historically restless leg syndrome is not suggested  #2 past history of spinal stenosis; status post lumbar laminectomy  #3 past history of mild anemia  Plan: Gabapentin will be titrated I. If symptoms fail to respond ;re imaging will be needed

## 2011-07-03 ENCOUNTER — Encounter: Payer: Self-pay | Admitting: Internal Medicine

## 2011-07-07 ENCOUNTER — Telehealth: Payer: Self-pay

## 2011-07-07 ENCOUNTER — Encounter: Payer: Self-pay | Admitting: Internal Medicine

## 2011-07-07 NOTE — Telephone Encounter (Signed)
Message copied from MyChart: Been taking gamapentin for the past 4 nights - am having trouble sleeping w/ this pill. Had no problem with clonazepam. What to do? Prescribe a mild sleeping pill? Your advice re my bms are working. Thank you.  Dr.Hopper please advise (patient aware Dr.Hopper is out of the office this afternoon and will return tomorrow)

## 2011-07-09 NOTE — Telephone Encounter (Signed)
I spoke with patient, patient stated she will try taking 2 of the 100 mg tab

## 2011-07-09 NOTE — Telephone Encounter (Signed)
Per Dr.Hopper he has already responded to this message. I reviewed the chart and was unable to locate response  Dr.Hopper please advise

## 2011-07-09 NOTE — Telephone Encounter (Signed)
This would be a paradoxic or UNexpected reaction. The typical effect this medication is excessive sleepiness. I would actually recommend a trial of 200 mg at bedtime. Adding a sleeping pill would increase risk of excessive somnolence and imbalance with risk of falling at night.

## 2011-07-10 ENCOUNTER — Telehealth: Payer: Self-pay | Admitting: Internal Medicine

## 2011-07-10 NOTE — Telephone Encounter (Signed)
Per Dr. Delton Coombes, have pt get loratadine otc and start.  She should call office back if symptoms do not improve or worsen.

## 2011-07-10 NOTE — Telephone Encounter (Signed)
Spoke with the pt and notified of recs per CDY She verbalized understanding and states nothing further needed 

## 2011-07-10 NOTE — Telephone Encounter (Signed)
I spoke with pt and she c/o severe PND, itchy eyes, and runny nose x 2 days. Denies any cough, sob, wheezing, chest tx. Feels this is all allergies. Pt is taking Singulair at bedtime and using dymista 1-2 sprays in each nostril. Pt is requesting further recs. Since CDY is off will forward to doc of the day for recs. Please advise RB thanks  Allergies  Allergen Reactions  . Codeine     REACTION: violently ill with N&V  . Norvasc (Amlodipine Besylate)     Nausea & vomiting  . Vasotec     cough

## 2011-07-14 ENCOUNTER — Other Ambulatory Visit: Payer: Self-pay | Admitting: Internal Medicine

## 2011-07-14 DIAGNOSIS — T887XXA Unspecified adverse effect of drug or medicament, initial encounter: Secondary | ICD-10-CM

## 2011-07-14 DIAGNOSIS — E785 Hyperlipidemia, unspecified: Secondary | ICD-10-CM

## 2011-07-14 DIAGNOSIS — R350 Frequency of micturition: Secondary | ICD-10-CM

## 2011-07-14 NOTE — Telephone Encounter (Signed)
LIPID/HEP 272.4/995.20  

## 2011-07-16 NOTE — Telephone Encounter (Signed)
Future orders placed, patient aware. 

## 2011-07-16 NOTE — Addendum Note (Signed)
Addended by: Maurice Small on: 07/16/2011 03:55 PM   Modules accepted: Orders

## 2011-07-16 NOTE — Telephone Encounter (Signed)
[facility]abs at [facility] wants a UA, wants to go to [facility] tomorrow Can call patient back regarding UA, states she hasn't had one in a while and thinks it should be done-ph#[phone], cell [phone]

## 2011-07-17 ENCOUNTER — Other Ambulatory Visit (INDEPENDENT_AMBULATORY_CARE_PROVIDER_SITE_OTHER): Payer: Medicare Other

## 2011-07-17 DIAGNOSIS — T887XXA Unspecified adverse effect of drug or medicament, initial encounter: Secondary | ICD-10-CM

## 2011-07-17 DIAGNOSIS — R350 Frequency of micturition: Secondary | ICD-10-CM

## 2011-07-17 DIAGNOSIS — E785 Hyperlipidemia, unspecified: Secondary | ICD-10-CM

## 2011-07-17 LAB — URINALYSIS, ROUTINE W REFLEX MICROSCOPIC
Hgb urine dipstick: NEGATIVE
Specific Gravity, Urine: 1.01 (ref 1.000–1.030)
Total Protein, Urine: NEGATIVE
Urine Glucose: NEGATIVE

## 2011-07-17 LAB — HEPATIC FUNCTION PANEL
ALT: 20 U/L (ref 0–35)
Alkaline Phosphatase: 51 U/L (ref 39–117)
Bilirubin, Direct: 0.1 mg/dL (ref 0.0–0.3)
Total Bilirubin: 0.5 mg/dL (ref 0.3–1.2)

## 2011-07-17 LAB — LIPID PANEL: VLDL: 7.6 mg/dL (ref 0.0–40.0)

## 2011-07-20 ENCOUNTER — Other Ambulatory Visit: Payer: Self-pay | Admitting: Dermatology

## 2011-07-21 ENCOUNTER — Other Ambulatory Visit: Payer: Medicare Other

## 2011-07-21 DIAGNOSIS — N390 Urinary tract infection, site not specified: Secondary | ICD-10-CM

## 2011-07-23 ENCOUNTER — Encounter: Payer: Self-pay | Admitting: Internal Medicine

## 2011-07-23 LAB — URINE CULTURE

## 2011-07-24 ENCOUNTER — Other Ambulatory Visit: Payer: Self-pay | Admitting: Internal Medicine

## 2011-07-24 DIAGNOSIS — R350 Frequency of micturition: Secondary | ICD-10-CM

## 2011-08-03 ENCOUNTER — Other Ambulatory Visit: Payer: Self-pay | Admitting: Internal Medicine

## 2011-08-03 MED ORDER — CLONAZEPAM 1 MG PO TABS: 1.0000 mg | ORAL_TABLET | Freq: Every day | ORAL | Status: DC

## 2011-08-03 NOTE — Telephone Encounter (Signed)
refill ClonazePAM (Tab) KLONOPIN 1 MG Take 1 tablet (1 mg total) by mouth at bedtime #30, last fill 5.29.13, last ov 6.6.13

## 2011-08-03 NOTE — Telephone Encounter (Signed)
RX was called in.

## 2011-08-11 ENCOUNTER — Other Ambulatory Visit: Payer: Self-pay | Admitting: Internal Medicine

## 2011-08-11 ENCOUNTER — Encounter: Payer: Self-pay | Admitting: Internal Medicine

## 2011-08-11 ENCOUNTER — Ambulatory Visit (INDEPENDENT_AMBULATORY_CARE_PROVIDER_SITE_OTHER): Payer: Medicare Other | Admitting: Internal Medicine

## 2011-08-11 VITALS — BP 132/72 | HR 80 | Ht 61.0 in | Wt 113.0 lb

## 2011-08-11 DIAGNOSIS — J320 Chronic maxillary sinusitis: Secondary | ICD-10-CM

## 2011-08-11 DIAGNOSIS — H1010 Acute atopic conjunctivitis, unspecified eye: Secondary | ICD-10-CM

## 2011-08-11 DIAGNOSIS — H1045 Other chronic allergic conjunctivitis: Secondary | ICD-10-CM

## 2011-08-11 NOTE — Progress Notes (Signed)
04/30/11- 53 yoF referred courtesy of Dr Alwyn Ren for allergy evaluation. She complains of sinus congestion with postnasal drainage "every other month". This has been going on over a year. She has seen Dr. Edd Fabian who treated with antibiotics. These help for a month at a time but then her nose starts to burn, drainage begins and head congestion gets worse. CT scan of the sinuses on 04/21/2011 showed bilateral chronic maxillary sinusitis. She denies any previously recognized allergy problem. 2 years ago black mold was noted around duct work in her home and remediated - symptoms got worse after that. She felt better while traveling in [place]. There has been no chest discomfort. Little sneeze or itch. No problems with  ears. She denies urticaria or atopic dermatitis. There has been no ENT surgery. Environment- house with remediation of black mold 2 years ago. Dry. 2 dogs in. No smokers. She is married, self-employed, works at home.  06/11/11- 76 yoF followed for allergic rhinitis Drainage that burns throat, sinus troubles,feels bad all over. She feels that her head is in a drum. Postnasal drip. Denies cough or wheeze.   CT scan of sinuses 04/21/2011-chronic maxillary sinusitis. Images reviewed with her. Allergy Profile  Total IgE < 1.5, negative.   08/11/11- 76 yoF followed for allergic rhinitis Pt states having  issues with eyes being red and itchy,along with sinus drainage.. her ophthalmologist gave her an OTC eyedrop which helps some. She is on other eyedrops typically used for glaucoma, but says they are being given for "a vein occlusion" and that she does not have glaucoma. Burning postnasal drip. Has tried Claritin and Singulair. Used Dymista sample then went back to [employer]. CT sinus 04/21/11- IMPRESSION:  Mild chronic sinusitis bilateral maxillary sinus.  Original Report Authenticated By: Camelia Phenes, M.D.   ROS-see HPI Constitutional:   No-   weight loss, night sweats, fevers, chills,  fatigue, lassitude. HEENT:   No-  headaches, difficulty swallowing, tooth/dental problems, sore throat,       No-  sneezing, itching, ear ache,   +nasal congestion, post nasal drip,  CV:  No-   chest pain, orthopnea, PND, swelling in lower extremities, anasarca,  dizziness, palpitations Resp: No-   shortness of breath with exertion or at rest.              No-   productive cough,  No non-productive cough,  No- coughing up of blood.              No-   change in color of mucus.  No- wheezing.   Skin: No-   rash or lesions. GI:  No-   heartburn, indigestion, abdominal pain, nausea, vomiting,  GU:   pain.MS:  No-   joint pain or swelling. . Neuro-     nothing unusual Psych:  No- change in mood or affect. No depression or anxiety.  No memory loss.  OBJ- Physical Exam General- Alert, Oriented, Affect-appropriate, Distress- none acute, slender Skin- rash-none, lesions- none, excoriation- none Lymphadenopathy- none Head- atraumatic            Eyes- Gross vision intact, PERRLA, conjunctivae inside her lids seem to very red without visible irregularity.            Ears- Hearing, canals-normal. TMs look retracted without fluid.            Nose- clear, no-Septal dev,  polyps, erosion, perforation             Throat- Mallampati II , mucosa clear ,  drainage- none, tonsils- atrophic Neck- flexible , trachea midline, no stridor , thyroid nl, carotid no bruit Chest - symmetrical excursion , unlabored           Heart/CV- RRR , no murmur , no gallop  , no rub, nl s1 s2                           - JVD- none , edema- none, stasis changes- none, varices- none           Lung- clear to P&A, wheeze- none, cough- none , dullness-none, rub- none           Chest wall-  Abd-  Br/ Gen/ Rectal- Not done, not indicated Extrem- cyanosis- none, clubbing, none, atrophy- none, strength- nl Neuro- grossly intact to observation

## 2011-08-11 NOTE — Patient Instructions (Addendum)
Ok to try an antihistamine once daily like loratadine or fexofenadine  Sample of Dymista nasal spray to repeat  1-2 puffs each nostril once daily at bedtime  Try an otc lubricating eye ointment like lacrilube- small amount inside lower lid, then allow it to smear and melt as you sleep.

## 2011-08-15 DIAGNOSIS — H1010 Acute atopic conjunctivitis, unspecified eye: Secondary | ICD-10-CM | POA: Insufficient documentation

## 2011-08-15 DIAGNOSIS — J320 Chronic maxillary sinusitis: Secondary | ICD-10-CM | POA: Insufficient documentation

## 2011-08-15 NOTE — Assessment & Plan Note (Deleted)
She continues to describe a burning post nasal drip. Dysmista seemed to help more than Flonase. We will let her try another sample. Consider repeat sinus CT this fall.

## 2011-08-15 NOTE — Assessment & Plan Note (Signed)
Treated by her ophthalmologist. Vicki Garcia Lacri-Lube ointment

## 2011-08-15 NOTE — Assessment & Plan Note (Signed)
She continues to describe a burning post nasal drip. Dysmista seemed to help more than Flonase. We will let her try another sample. Consider repeat sinus CT this fall. 

## 2011-08-25 ENCOUNTER — Telehealth: Payer: Self-pay | Admitting: Internal Medicine

## 2011-08-25 MED ORDER — VALSARTAN-HYDROCHLOROTHIAZIDE 160-12.5 MG PO TABS: 1.0000 | ORAL_TABLET | Freq: Every day | ORAL | Status: DC

## 2011-08-25 NOTE — Telephone Encounter (Signed)
I left message on voicemail for patient to call to discuss medication. Med not filled since 2011 based on our records

## 2011-08-25 NOTE — Telephone Encounter (Signed)
Pt returned your call. She states her last refill on this med was 07-26-11.  Call [phone]

## 2011-08-25 NOTE — Telephone Encounter (Signed)
Refill: Valsartan-hctz 160-12.5mg . Take 1 or 2 tablets daily. Qty 30. Last fill 07-26-11

## 2011-08-25 NOTE — Telephone Encounter (Signed)
I called and left message for patient to return call.

## 2011-08-25 NOTE — Telephone Encounter (Signed)
I spoke with patient and she states she has always taken this medication and never d/c'ed

## 2011-09-03 ENCOUNTER — Other Ambulatory Visit: Payer: Self-pay

## 2011-09-03 ENCOUNTER — Encounter: Payer: Self-pay | Admitting: Internal Medicine

## 2011-09-03 MED ORDER — HYDROCORTISONE 2.5 % RE CREA: 1.0000 "application " | TOPICAL_CREAM | RECTAL | Status: DC | PRN

## 2011-11-12 ENCOUNTER — Encounter: Payer: Self-pay | Admitting: Internal Medicine

## 2011-11-12 ENCOUNTER — Ambulatory Visit (INDEPENDENT_AMBULATORY_CARE_PROVIDER_SITE_OTHER): Payer: Medicare Other | Admitting: Internal Medicine

## 2011-11-12 VITALS — BP 144/84 | HR 67 | Ht 61.0 in | Wt 112.4 lb

## 2011-11-12 DIAGNOSIS — J3089 Other allergic rhinitis: Secondary | ICD-10-CM

## 2011-11-12 DIAGNOSIS — J302 Other seasonal allergic rhinitis: Secondary | ICD-10-CM

## 2011-11-12 DIAGNOSIS — J309 Allergic rhinitis, unspecified: Secondary | ICD-10-CM

## 2011-11-12 DIAGNOSIS — Z23 Encounter for immunization: Secondary | ICD-10-CM

## 2011-11-12 NOTE — Progress Notes (Signed)
04/30/11- 33 yoF referred courtesy of Dr Alwyn Ren for allergy evaluation. She complains of sinus congestion with postnasal drainage "every other month". This has been going on over a year. She has seen Dr. Edd Fabian who treated with antibiotics. These help for a month at a time but then her nose starts to burn, drainage begins and head congestion gets worse. CT scan of the sinuses on 04/21/2011 showed bilateral chronic maxillary sinusitis. She denies any previously recognized allergy problem. 2 years ago black mold was noted around duct work in her home and remediated - symptoms got worse after that. She felt better while traveling in [place]. There has been no chest discomfort. Little sneeze or itch. No problems with  ears. She denies urticaria or atopic dermatitis. There has been no ENT surgery. Environment- house with remediation of black mold 2 years ago. Dry. 2 dogs in. No smokers. She is married, self-employed, works at home.  06/11/11- 76 yoF followed for allergic rhinitis Drainage that burns throat, sinus troubles,feels bad all over. She feels that her head is in a drum. Postnasal drip. Denies cough or wheeze.   CT scan of sinuses 04/21/2011-chronic maxillary sinusitis. Images reviewed with her. Allergy Profile  Total IgE < 1.5, negative.   08/11/11- 76 yoF followed for allergic rhinitis Pt states having  issues with eyes being red and itchy,along with sinus drainage.. her ophthalmologist gave her an OTC eyedrop which helps some. She is on other eyedrops typically used for glaucoma, but says they are being given for "a vein occlusion" and that she does not have glaucoma. Burning postnasal drip. Has tried Claritin and Singulair. Used Dymista sample then went back to [employer]. CT sinus 04/21/11- IMPRESSION:  Mild chronic sinusitis bilateral maxillary sinus.  Original Report Authenticated By: Camelia Phenes, M.D.   11/12/11- 76 yoF followed for allergic rhinitis/ conjunctivitis c/o feeling  bad,woke up today w/hoarseness,cough-dry,,no wheezing,no sob,no fcs,sinus pr. Dry cough this week. Mild sore throat, no fever. This fall increased seasonal rhinitis. Using Dymista and singulair.  ROS-see HPI Constitutional:   No-   weight loss, night sweats, fevers, chills, fatigue, lassitude. HEENT:   No-  headaches, difficulty swallowing, tooth/dental problems, sore throat,       No-  sneezing, itching, ear ache,   +nasal congestion, post nasal drip,  CV:  No-   chest pain, orthopnea, PND, swelling in lower extremities, anasarca,  dizziness, palpitations Resp: No-   shortness of breath with exertion or at rest.              No-   productive cough,  + non-productive cough,  No- coughing up of blood.              No-   change in color of mucus.  No- wheezing.   Skin: No-   rash or lesions. GI:  No-   heartburn, indigestion, abdominal pain, nausea, vomiting,  GU:   pain.MS:  No-   joint pain or swelling. . Neuro-     nothing unusual Psych:  No- change in mood or affect. No depression or anxiety.  No memory loss.  OBJ- Physical Exam General- Alert, Oriented, Affect-appropriate, Distress- none acute, slender Skin- rash-none, lesions- none, excoriation- none Lymphadenopathy- none Head- atraumatic            Eyes- Gross vision intact, PERRLA, .            Ears- Hearing, canals-normal. TMs look retracted without fluid.  Nose- + pale mucosa, no-Septal dev,  polyps, erosion, perforation             Throat- Mallampati II , mucosa clear, not red , drainage- none, tonsils- atrophic.  Neck- flexible , trachea midline, no stridor , thyroid nl, carotid no bruit Chest - symmetrical excursion , unlabored           Heart/CV- RRR , no murmur , no gallop  , no rub, nl s1 s2                           - JVD- none , edema- none, stasis changes- none, varices- none           Lung- + loose upper airway rattle, wheeze- none, cough- none , dullness-none, rub- none           Chest wall-  Abd-  Br/  Gen/ Rectal- Not done, not indicated Extrem- cyanosis- none, clubbing, none, atrophy- none, strength- nl Neuro- grossly intact to observation

## 2011-11-12 NOTE — Patient Instructions (Addendum)
For now- fluids, rest, comfort measures Zinc products - throat lozenges and etc have seemed to blunt the onset of colds for some people  Try using an otc saline nasal gel as needed for soothing protection of the lining of your nose  Try phenylephrine otc nasal decongestant. One otc brand is Sudafed-PE  Flu vax

## 2011-11-24 ENCOUNTER — Telehealth: Payer: Self-pay | Admitting: Internal Medicine

## 2011-11-24 ENCOUNTER — Encounter: Payer: Self-pay | Admitting: Internal Medicine

## 2011-11-24 NOTE — Telephone Encounter (Signed)
pt called [facility] 11.7.13 f/u 11.12.13

## 2011-11-25 NOTE — Assessment & Plan Note (Signed)
Has had seasonal exacerbation but today's illness is probably viral pattern URI. We discussed ways- rest, fluids, maybe zinc, to hold off viral infection. Use saline nasal gel, sudafed.

## 2011-11-26 ENCOUNTER — Other Ambulatory Visit (INDEPENDENT_AMBULATORY_CARE_PROVIDER_SITE_OTHER): Payer: Medicare Other

## 2011-11-26 DIAGNOSIS — E785 Hyperlipidemia, unspecified: Secondary | ICD-10-CM

## 2011-11-26 DIAGNOSIS — I10 Essential (primary) hypertension: Secondary | ICD-10-CM

## 2011-11-26 LAB — BASIC METABOLIC PANEL
CO2: 31 mEq/L (ref 19–32)
Calcium: 9.4 mg/dL (ref 8.4–10.5)
Chloride: 99 mEq/L (ref 96–112)
Glucose, Bld: 93 mg/dL (ref 70–99)
Sodium: 138 mEq/L (ref 135–145)

## 2011-12-01 ENCOUNTER — Encounter: Payer: Self-pay | Admitting: Internal Medicine

## 2011-12-01 ENCOUNTER — Ambulatory Visit (INDEPENDENT_AMBULATORY_CARE_PROVIDER_SITE_OTHER): Payer: Medicare Other | Admitting: Internal Medicine

## 2011-12-01 VITALS — BP 120/78 | HR 83 | Temp 98.0°F | Resp 13 | Wt 111.2 lb

## 2011-12-01 DIAGNOSIS — E785 Hyperlipidemia, unspecified: Secondary | ICD-10-CM

## 2011-12-01 DIAGNOSIS — I10 Essential (primary) hypertension: Secondary | ICD-10-CM

## 2011-12-01 MED ORDER — CLONAZEPAM 1 MG PO TABS: ORAL_TABLET | ORAL | Status: DC

## 2011-12-01 NOTE — Patient Instructions (Addendum)
Stay on clear liquids for 48-72 hours or until bowels are normal.This would include  jello, sherbert (NOT ice cream), Lipton's chicken noodle soup(NOT cream based soups),Gatorade Lite, flat Ginger ale (without High Fructose Corn Syrup),dry toast or crackers, baked potato.No milk , dairy or grease until bowels are formed. Align , a Pro Biotic , daily if stools are loose. Immodium AD for frankly watery stool. Report increasing pain, fever or rectal bleeding 

## 2011-12-01 NOTE — Assessment & Plan Note (Signed)
Blood pressure is well controlled; renal function is excellent

## 2011-12-01 NOTE — Assessment & Plan Note (Signed)
Lipids are at goal; they should be rechecked in June/2014

## 2011-12-01 NOTE — Progress Notes (Signed)
  Subjective:    Patient ID: Vicki Garcia, female    DOB: 11/14/1935, 76 y.o.   MRN: [id number]  HPI  She is to see Dr. Shon Baton concerning the chronic back , hip pain & leg pains; these does appear to be progressing. Gabapentin was of no benefit.  Her recent labs were reviewed. The chemistry studies and TSH were excellent.  Her last liver function tests and lipids were done 07/17/11 were also at goal.   Review of Systems HYPERTENSION: Disease Monitoring: Blood pressure range- 108-144/80  Chest pain, palpitations- intermittent palpitations     Dyspnea- no Medications: Compliance- yes Lightheadedness,Syncope- near syncope with event described below    Edema- no  HYPERLIPIDEMIA: Disease Monitoring: See symptoms for Hypertension Medications: Compliance- yes  Abd pain, bowel changes-  no; over the weekend  11/9-10 she did have diaphoresis followed by dry heaves and finally vomiting. She denied associated abdominal pain or nausea. Dysuria, hematuria, or pyuria. She also denied any diarrhea. The symptoms have resolved except for malaise.  Muscle aches- no           Objective:   Physical Exam General appearance : thin but adequately  nourished w/o distress.  Eyes: No conjunctival inflammation or scleral icterus is present.  Oral exam: Dental hygiene is good; lips and gums are healthy appearing.There is no oropharyngeal erythema or exudate noted.   Heart:  Normal rate and regular rhythm. S1 and S2 normal without gallop, murmur, click, rub .S4 with slurring at  LSB   Lungs:Chest clear to auscultation; no wheezes, rhonchi,rales ,or rubs present.No increased work of breathing.   Abdomen: bowel sounds normal, soft but minimally tender in epigastrium without masses, organomegaly or hernias noted.  No guarding or rebound   Skin:Warm & dry.  Intact without suspicious lesions or rashes ; no jaundice or tenting  Lymphatic: No lymphadenopathy is noted about the head, neck, axilla areas.                Assessment & Plan:

## 2011-12-07 ENCOUNTER — Encounter: Payer: Self-pay | Admitting: Internal Medicine

## 2011-12-08 ENCOUNTER — Ambulatory Visit (INDEPENDENT_AMBULATORY_CARE_PROVIDER_SITE_OTHER): Payer: Medicare Other | Admitting: Family Medicine

## 2011-12-08 ENCOUNTER — Encounter: Payer: Self-pay | Admitting: Family Medicine

## 2011-12-08 VITALS — BP 120/70 | HR 76 | Temp 97.7°F | Wt 110.8 lb

## 2011-12-08 DIAGNOSIS — R42 Dizziness and giddiness: Secondary | ICD-10-CM

## 2011-12-08 DIAGNOSIS — Z9109 Other allergy status, other than to drugs and biological substances: Secondary | ICD-10-CM

## 2011-12-08 DIAGNOSIS — J329 Chronic sinusitis, unspecified: Secondary | ICD-10-CM

## 2011-12-08 MED ORDER — MECLIZINE HCL 25 MG PO TABS: 25.0000 mg | ORAL_TABLET | Freq: Three times a day (TID) | ORAL | Status: DC | PRN

## 2011-12-08 MED ORDER — AZELASTINE-FLUTICASONE 137-50 MCG/ACT NA SUSP: 1.0000 | Freq: Two times a day (BID) | NASAL | Status: DC

## 2011-12-08 MED ORDER — AMOXICILLIN-POT CLAVULANATE 875-125 MG PO TABS: 1.0000 | ORAL_TABLET | Freq: Two times a day (BID) | ORAL | Status: DC

## 2011-12-08 NOTE — Progress Notes (Signed)
  Subjective:     Vicki Garcia is a 76 y.o. female who presents for evaluation of sinus pain. Symptoms include: clear rhinorrhea, cough, headaches, nasal congestion, post nasal drip and sinus pressure. Onset of symptoms was several years ago. Symptoms have been unchanged since that time. Past history is significant for no history of pneumonia or bronchitis. Patient is a non-smoker.  The following portions of the patient's history were reviewed and updated as appropriate: allergies, current medications, past family history, past medical history, past social history, past surgical history and problem list.  Review of Systems Pertinent items are noted in HPI.   Objective:    BP 120/70  Pulse 76  Temp 97.7 F (36.5 C) (Oral)  Wt 110 lb 12.8 oz (50.259 kg)  SpO2 99% General appearance: alert, cooperative, appears stated age and no distress Ears: normal TM's and external ear canals both ears Nose: green discharge, mild congestion, turbinates red, swollen Throat: abnormal findings: exudates present and mild oropharyngeal erythema Neck: mild anterior cervical adenopathy, supple, symmetrical, trachea midline and thyroid not enlarged, symmetric, no tenderness/mass/nodules Lungs: clear to auscultation bilaterally Heart: S1, S2 normal Extremities: extremities normal, atraumatic, no cyanosis or edema Lymph nodes: Cervical adenopathy: b/l Neurologic: Alert and oriented X 3, normal strength and tone. Normal symmetric reflexes. Normal coordination and gait    Assessment:    Acute bacterial sinusitis.   vertigo- meclizine Plan:    Nasal steroids per medication orders. Antihistamines per medication orders. Augmentin per medication orders.  If vertigo and tinnitis does not resolve with tx of sinusitis --rto or f/u ENT

## 2011-12-08 NOTE — Patient Instructions (Signed)

## 2011-12-09 ENCOUNTER — Other Ambulatory Visit: Payer: Self-pay | Admitting: Family Medicine

## 2011-12-09 DIAGNOSIS — J329 Chronic sinusitis, unspecified: Secondary | ICD-10-CM

## 2011-12-09 DIAGNOSIS — J302 Other seasonal allergic rhinitis: Secondary | ICD-10-CM

## 2011-12-09 MED ORDER — AZELASTINE HCL 0.1 % NA SOLN: 1.0000 | Freq: Two times a day (BID) | NASAL | Status: DC

## 2011-12-09 MED ORDER — FLUTICASONE PROPIONATE 50 MCG/ACT NA SUSP: 2.0000 | Freq: Every day | NASAL | Status: DC

## 2012-02-08 ENCOUNTER — Ambulatory Visit: Payer: Medicare Other | Attending: Orthopedic Surgery | Admitting: Physical Therapy

## 2012-02-08 DIAGNOSIS — M545 Low back pain, unspecified: Secondary | ICD-10-CM | POA: Insufficient documentation

## 2012-02-08 DIAGNOSIS — M2569 Stiffness of other specified joint, not elsewhere classified: Secondary | ICD-10-CM | POA: Insufficient documentation

## 2012-02-08 DIAGNOSIS — M25569 Pain in unspecified knee: Secondary | ICD-10-CM | POA: Insufficient documentation

## 2012-02-08 DIAGNOSIS — M25559 Pain in unspecified hip: Secondary | ICD-10-CM | POA: Insufficient documentation

## 2012-02-08 DIAGNOSIS — IMO0001 Reserved for inherently not codable concepts without codable children: Secondary | ICD-10-CM | POA: Insufficient documentation

## 2012-02-10 ENCOUNTER — Ambulatory Visit: Payer: Medicare Other | Admitting: Physical Therapy

## 2012-02-15 ENCOUNTER — Ambulatory Visit: Payer: Medicare Other | Admitting: Physical Therapy

## 2012-02-16 ENCOUNTER — Encounter: Payer: Self-pay | Admitting: Internal Medicine

## 2012-02-16 ENCOUNTER — Ambulatory Visit (INDEPENDENT_AMBULATORY_CARE_PROVIDER_SITE_OTHER): Payer: Medicare Other | Admitting: Internal Medicine

## 2012-02-16 VITALS — BP 118/70 | HR 64 | Temp 98.1°F | Wt 112.0 lb

## 2012-02-16 DIAGNOSIS — J310 Chronic rhinitis: Secondary | ICD-10-CM

## 2012-02-16 DIAGNOSIS — K625 Hemorrhage of anus and rectum: Secondary | ICD-10-CM

## 2012-02-16 DIAGNOSIS — K649 Unspecified hemorrhoids: Secondary | ICD-10-CM

## 2012-02-16 MED ORDER — HYDROCORTISONE 2.5 % RE CREA: TOPICAL_CREAM | RECTAL | Status: DC

## 2012-02-16 NOTE — Progress Notes (Signed)
  Subjective:    Patient ID: Vicki Garcia, female    DOB: 1935/12/26, 77 y.o.   MRN: [id number] HPI #1 Painless hemorrhoidal bleeding over past week in context of constipation.Last colonoscopy 2008 revealed diverticulosis & ? Polyp.Treatment with Sitz baths,TUCKS &  Proctofoam HC helped.  #2 The respiratory tract symptoms are chronic recurrent over past 2 years, exacerbation in past week as sore throat , rhinitis, &  head congestion. Significant active  associated symptoms include frontal headache.   Cough is not associated with  shortness of breath and wheezing . Scant sputum is described as yellow.     Extrinsic symptoms of itchy, watery eyes, or sneezing were present .    Myalgias and arthralgias were not present  Flu shot  current        Treatment with  Flonase,Mucinex, Singulair was partially effective   There is no history of asthma .PMH of seasonal perennial allergies. The patient had  quit smoking in 1971                Review of Systems She denies epistaxis, hemoptysis, hematuria or melena  .She has no unexplained weight loss, dysphagia, or abdominal pain. She has no abnormal bruising or bleeding. She has no difficulty stopping bleeding with injury.   Symptoms not present include  facial pain, dental pain, sore throat, nasal purulence, earache , and otic discharge  Fever,chills & sweats not present      Objective:   Physical Exam General appearance: thin but well nourished; no acute distress or increased work of breathing is present.  No  lymphadenopathy about the head, neck, or axilla noted.  Eyes: No conjunctival inflammation or lid edema is present.  Ears:  External ear exam shows no significant lesions or deformities.  Otoscopic examination reveals clear canals, tympanic membranes are intact bilaterally without bulging, retraction, inflammation or discharge. Nose:  External nasal examination shows no deformity or inflammation. Nasal mucosa aredry  without lesions or exudates. No septal dislocation or deviation.No obstruction to airflow.  Oral exam: Dental hygiene is good; lips and gums are healthy appearing.There is no oropharyngeal erythema or exudate noted.  Neck:  No deformities, masses, or tenderness noted.    Heart:  Normal rate and regular rhythm. S1 and S2 normal without gallop, murmur, click, rub or other extra sounds.  Lungs:Chest clear to auscultation; no wheezes, rhonchi,rales ,or rubs present.No increased work of breathing.  Decreased breath sounds Bowel sounds are normal. Abdomen is soft and nontender with no organomegaly, hernias  or masses. Nonbleeding external hemorrhoids present. Tight rectal sphincter. Hemoccult testing negative Extremities:  No cyanosis, edema, or clubbing  noted  Skin: Warm & dry .          Assessment & Plan:  #1 rectal bleeding to hemorrhoids  #2 nonallergic rhinitis  Plan: See orders and recommendations

## 2012-02-16 NOTE — Patient Instructions (Addendum)
Plain Mucinex (NOT D) for thick secretions ;force NON dairy fluids .   Nasal cleansing in the shower as discussed with lather of mild shampoo.After 10 seconds wash off lather while  exhaling through nostrils. Make sure that all residual soap is removed to prevent irritation.  Fluticasone 1 spray in each nostril twice a day as needed. Use the "crossover" technique into opposite nostril spraying toward opposite ear @ 45 degree angle, not straight up into nostril.  Use a Neti pot daily only  as needed for significant sinus congestion; going from open side to congested side . Plain Allegra (NOT D )  160 daily , Loratidine 10 mg , OR Zyrtec 10 mg @ bedtime  as needed for itchy eyes & sneezing.     Soak your buttocks in Sitz bath 2-3X/ day and then apply the cream to the hemorrhoidal tissue.   Increase roughage (fruits and vegetables) and your diet as recommended. Drink to thirst up to 40 ounces of water a day. Metamucil or other such agents may help the constipation. Take MiraLax every third night as needed for constipation.

## 2012-02-17 ENCOUNTER — Ambulatory Visit: Payer: Medicare Other

## 2012-02-19 ENCOUNTER — Ambulatory Visit: Payer: Medicare Other | Admitting: Physical Therapy

## 2012-02-22 ENCOUNTER — Ambulatory Visit: Payer: Medicare Other | Attending: Orthopedic Surgery | Admitting: Physical Therapy

## 2012-02-22 DIAGNOSIS — M545 Low back pain, unspecified: Secondary | ICD-10-CM | POA: Insufficient documentation

## 2012-02-22 DIAGNOSIS — IMO0001 Reserved for inherently not codable concepts without codable children: Secondary | ICD-10-CM | POA: Insufficient documentation

## 2012-02-22 DIAGNOSIS — M25559 Pain in unspecified hip: Secondary | ICD-10-CM | POA: Insufficient documentation

## 2012-02-22 DIAGNOSIS — M25569 Pain in unspecified knee: Secondary | ICD-10-CM | POA: Insufficient documentation

## 2012-02-24 ENCOUNTER — Ambulatory Visit: Payer: Medicare Other | Admitting: Physical Therapy

## 2012-02-29 ENCOUNTER — Ambulatory Visit: Payer: Medicare Other | Admitting: Physical Therapy

## 2012-03-02 ENCOUNTER — Ambulatory Visit: Payer: Medicare Other | Admitting: Physical Therapy

## 2012-03-07 ENCOUNTER — Encounter: Payer: Medicare Other | Admitting: Physical Therapy

## 2012-03-07 ENCOUNTER — Ambulatory Visit: Payer: Medicare Other | Admitting: Physical Therapy

## 2012-03-09 ENCOUNTER — Encounter: Payer: Medicare Other | Admitting: Physical Therapy

## 2012-03-09 ENCOUNTER — Ambulatory Visit: Payer: Medicare Other | Admitting: Physical Therapy

## 2012-03-14 ENCOUNTER — Ambulatory Visit: Payer: Medicare Other | Admitting: Physical Therapy

## 2012-03-16 ENCOUNTER — Ambulatory Visit: Payer: Medicare Other | Admitting: Physical Therapy

## 2012-03-21 ENCOUNTER — Ambulatory Visit: Payer: Medicare Other | Attending: Orthopedic Surgery | Admitting: Physical Therapy

## 2012-03-21 DIAGNOSIS — M545 Low back pain, unspecified: Secondary | ICD-10-CM | POA: Insufficient documentation

## 2012-03-21 DIAGNOSIS — M25559 Pain in unspecified hip: Secondary | ICD-10-CM | POA: Insufficient documentation

## 2012-03-21 DIAGNOSIS — IMO0001 Reserved for inherently not codable concepts without codable children: Secondary | ICD-10-CM | POA: Insufficient documentation

## 2012-03-21 DIAGNOSIS — M25569 Pain in unspecified knee: Secondary | ICD-10-CM | POA: Insufficient documentation

## 2012-03-23 ENCOUNTER — Ambulatory Visit: Payer: Medicare Other | Admitting: Physical Therapy

## 2012-03-28 ENCOUNTER — Ambulatory Visit: Payer: Medicare Other | Admitting: Physical Therapy

## 2012-03-30 ENCOUNTER — Ambulatory Visit: Payer: Medicare Other | Admitting: Physical Therapy

## 2012-05-12 ENCOUNTER — Encounter: Payer: Self-pay | Admitting: Internal Medicine

## 2012-05-16 ENCOUNTER — Other Ambulatory Visit: Payer: Self-pay | Admitting: Internal Medicine

## 2012-05-17 ENCOUNTER — Encounter: Payer: Self-pay | Admitting: Internal Medicine

## 2012-05-24 ENCOUNTER — Encounter: Payer: Self-pay | Admitting: Lab

## 2012-05-25 ENCOUNTER — Ambulatory Visit (INDEPENDENT_AMBULATORY_CARE_PROVIDER_SITE_OTHER): Payer: Medicare Other | Admitting: Internal Medicine

## 2012-05-25 ENCOUNTER — Encounter: Payer: Self-pay | Admitting: Internal Medicine

## 2012-05-25 VITALS — BP 124/78 | HR 82 | Temp 98.9°F | Wt 110.0 lb

## 2012-05-25 DIAGNOSIS — R1319 Other dysphagia: Secondary | ICD-10-CM

## 2012-05-25 DIAGNOSIS — R000 Tachycardia, unspecified: Secondary | ICD-10-CM

## 2012-05-25 DIAGNOSIS — K219 Gastro-esophageal reflux disease without esophagitis: Secondary | ICD-10-CM

## 2012-05-25 DIAGNOSIS — G479 Sleep disorder, unspecified: Secondary | ICD-10-CM

## 2012-05-25 MED ORDER — OMEPRAZOLE 20 MG PO CPDR: 20.0000 mg | DELAYED_RELEASE_CAPSULE | Freq: Every day | ORAL | Status: DC

## 2012-05-25 NOTE — Patient Instructions (Addendum)
Reflux of gastric acid may be asymptomatic as this may occur mainly during sleep.The triggers for reflux  include stress; the "aspirin family" ; alcohol; peppermint; and caffeine (coffee, tea, cola, and chocolate). The aspirin family would include aspirin and the nonsteroidal agents such as ibuprofen &  Naproxen. Tylenol would not cause reflux. If having symptoms ; food & drink should be avoided for @ least 2 hours before going to bed.  To prevent sleep dysfunction follow these instructions for sleep hygiene. Do not read, watch TV, or eat in bed. Do not get into bed until you are ready to turn off the light &  to go to sleep. Do not ingest stimulants ( decongestants, diet pills, nicotine, caffeine) after the evening meal. Please complete and return stool cards; these will determine whether there is any gastrointestinal bleeding risk. Take the EKG to any emergency room or preop visits. There are nonspecific changes; as long as there is no new change these are not clinically significant . If the old EKG is not available for comparison; it may result in unnecessary hospitalization for observation with significant unnecessary expense.

## 2012-05-25 NOTE — Progress Notes (Signed)
  Subjective:    Patient ID: Vicki Garcia, female    DOB: 27-Jun-1935, 77 y.o.   MRN: [id number]  HPI  She has several concerns; her greatest concern was severe heartburn which woke her last week. Upon awakening she noted that she was having tachycardia which lasted approximately 10 minutes. She did get some relief taking her Prilosec for heartburn.  The symptoms have not recurred since. She does have intermittent dysphagia, approximately one time a month. She takes Prilosec irregularly.  She is physically active walking 30 minutes 3 times a week; taking colotomies; and strength training 2 times a week. She completed her rehabilitation program for the spinal stenosis. She denies chest pain, dyspnea, or palpitations with the physical activity.      Review of Systems She has some hoarseness in the context of extrinsic rhinoconjunctivitis.  She has had a 10-12 pound weight loss which she attributed to change in her sense of taste after taking antibiotics for presumed infection  Her only rectal bleeding has been related to hemorrhoids. She denies significant rectal bleeding or melena.  She has chronic difficulty going to sleep and awakens early at 4-5 AM despite taking clonazepam half a pill at bedtime.   She has no associated excessive snoring, sleep apnea, restless leg syndrome.        Objective:   Physical Exam Gen.: Thin but healthy and well-nourished in appearance. Alert, appropriate and cooperative throughout exam.Appears younger than stated age   Eyes: No corneal or conjunctival inflammation noted. No icterus Ears: External  ear exam reveals no significant lesions or deformities. Canals clear .TMs normal. Nose: External nasal exam reveals no deformity or inflammation. Nasal mucosa are pink and moist. No lesions or exudates noted.  Mouth: Oral mucosa and oropharynx reveal no lesions or exudates. Teeth in good repair. Neck: No deformities, masses, or tenderness noted.Thyroid  normal. Lungs: Normal respiratory effort; chest expands symmetrically. Lungs are clear to auscultation without rales, wheezes, or increased work of breathing. Heart: Normal rate and rhythm. Normal S1 and S2. No gallop, click, or rub. S 4 w/o murmur. Abdomen: Bowel sounds normal; abdomen soft and nontender. No masses, organomegaly or hernias noted.Aorta palpable ;intermittent bruit ;no AAA     Musculoskeletal/extremities: No deformity or scoliosis noted of  the thoracic or lumbar spine.  No clubbing, cyanosis, edema, or significant extremity  deformity noted. Tone & strength  Normal. Able to lie down & sit up w/o help. Negative SLR bilaterally Vascular: Carotid, radial artery, dorsalis pedis and  posterior tibial pulses are full and equal. No bruits present. Neurologic: Alert and oriented x3.   Skin: Intact without suspicious lesions or rashes. Lymph: No cervical, axillary lymphadenopathy present. Psych: Mood and affect are normal. Normally interactive                                                                                        Assessment & Plan:  #1 reflux associated with severe heartburn and intermittent dysphagia  #2 tachycardia, self-limited in context of #1. No exertional-induced tachycardia, palpitations, or chest pain.  #3 chronic sleep disorder  Plan: See orders and recommendations

## 2012-05-26 LAB — MAGNESIUM: Magnesium: 2 mg/dL (ref 1.5–2.5)

## 2012-05-26 LAB — BASIC METABOLIC PANEL
Chloride: 98 mEq/L (ref 96–112)
Creatinine, Ser: 0.8 mg/dL (ref 0.4–1.2)
Potassium: 3.6 mEq/L (ref 3.5–5.1)

## 2012-05-26 LAB — CBC WITH DIFFERENTIAL/PLATELET
Basophils Absolute: 0.1 10*3/uL (ref 0.0–0.1)
Eosinophils Absolute: 0.1 10*3/uL (ref 0.0–0.7)
Lymphocytes Relative: 31 % (ref 12.0–46.0)
MCHC: 34.3 g/dL (ref 30.0–36.0)
Neutrophils Relative %: 56.1 % (ref 43.0–77.0)
RDW: 12.6 % (ref 11.5–14.6)

## 2012-05-26 LAB — TSH: TSH: 2.47 u[IU]/mL (ref 0.35–5.50)

## 2012-06-08 ENCOUNTER — Encounter: Payer: Self-pay | Admitting: Internal Medicine

## 2012-06-12 ENCOUNTER — Encounter: Payer: Self-pay | Admitting: Internal Medicine

## 2012-06-14 ENCOUNTER — Other Ambulatory Visit (INDEPENDENT_AMBULATORY_CARE_PROVIDER_SITE_OTHER): Payer: Medicare Other

## 2012-06-14 ENCOUNTER — Encounter: Payer: Self-pay | Admitting: Internal Medicine

## 2012-06-14 DIAGNOSIS — Z1211 Encounter for screening for malignant neoplasm of colon: Secondary | ICD-10-CM

## 2012-06-14 DIAGNOSIS — Z1289 Encounter for screening for malignant neoplasm of other sites: Secondary | ICD-10-CM

## 2012-06-14 DIAGNOSIS — Z0000 Encounter for general adult medical examination without abnormal findings: Secondary | ICD-10-CM

## 2012-06-14 LAB — HEMOCCULT GUIAC POC 1CARD (OFFICE): Card #2 Fecal Occult Blod, POC: NEGATIVE

## 2012-06-15 ENCOUNTER — Encounter: Payer: Self-pay | Admitting: Internal Medicine

## 2012-06-15 ENCOUNTER — Other Ambulatory Visit: Payer: Self-pay | Admitting: Internal Medicine

## 2012-06-15 DIAGNOSIS — K921 Melena: Secondary | ICD-10-CM

## 2012-06-15 DIAGNOSIS — K219 Gastro-esophageal reflux disease without esophagitis: Secondary | ICD-10-CM

## 2012-07-04 ENCOUNTER — Encounter: Payer: Self-pay | Admitting: Internal Medicine

## 2012-07-04 ENCOUNTER — Ambulatory Visit (INDEPENDENT_AMBULATORY_CARE_PROVIDER_SITE_OTHER): Payer: Medicare Other | Admitting: Internal Medicine

## 2012-07-04 VITALS — BP 128/64 | HR 80 | Ht 61.0 in | Wt 110.0 lb

## 2012-07-04 DIAGNOSIS — Z86010 Personal history of colonic polyps: Secondary | ICD-10-CM

## 2012-07-04 DIAGNOSIS — R195 Other fecal abnormalities: Secondary | ICD-10-CM

## 2012-07-04 DIAGNOSIS — K219 Gastro-esophageal reflux disease without esophagitis: Secondary | ICD-10-CM

## 2012-07-04 DIAGNOSIS — R634 Abnormal weight loss: Secondary | ICD-10-CM

## 2012-07-04 MED ORDER — MOVIPREP 100 G PO SOLR: 1.0000 | Freq: Once | ORAL | Status: DC

## 2012-07-04 NOTE — Progress Notes (Signed)
HISTORY OF PRESENT ILLNESS:  Vicki Garcia is a 77 y.o. female with multiple medical problems as listed below. I last saw the patient 08/09/2008 for screening colonoscopy. He was found to have AV malformations of the cecum, 3 diminutive polyps which were removed and found to be adenomatous, scattered diverticulosis, and internal hemorrhoids. Followup in 5 years recommended. She presents today, upon referral by Dr. hopper, regarding Hemoccult-positive stool and reflux symptoms. Patient was recently seen and submitted Hemoccult studies which were positive in one of 3. Unclear why the studies were performed. Review of outside laboratories from May 2014 reveal normal basic metabolic panel and CBC with hemoglobin 12.4. Also normal thyroid testing. Patient does report that she occasionally sees some like bleeding with known hemorrhoids. She reports intermittent problems with pyrosis and regurgitation for which he takes PPI on demand. This year, she describes 2 episodes of postprandial regurgitation or vomiting. She has had unexplained 10 pound weight loss. She was awoken on a couple of occasions with regurgitation and substernal burning. Was evaluated in the emergency room to rule out cardiac causes. Negative ultrasound. No true dysphagia. GI review of systems is otherwise negative.  REVIEW OF SYSTEMS:  All non-GI ROS negative except for sinus and allergy troubles, back pain, insomnia  Past Medical History  Diagnosis Date  . Hyperlipemia   . Hypertension   . Elevated homocysteine   . Skin cancer     Melanoma RUE 1987; monitored by Dr.Harrison Turner  . Lumbosacral radiculopathy     in context of Spinal Stenosis  . History of colonic polyps   . Diverticulosis of colon     AVM @ colonoscopy 07/2008  . Retinal vein occlusion 2005  . Anal fissure   . Spinal stenosis of lumbar region   . GERD (gastroesophageal reflux disease)   . Internal hemorrhoids     Past Surgical History  Procedure Laterality  Date  . Lumbar laminectomy  10/2006     for spinal stenosis, Dr Shon Baton, [facility] [facility]  . Rotator cuff repair  2005  . Melanoma rue  1987  . G2 p1 m1    . Dilation and curettage of uterus       X 2  . Od hemorrhage      S/P  injections @ [facility]  . Polypectomy  2010    Social History Riane Rung  reports that she quit smoking about 43 years ago. She has never used smokeless tobacco. She reports that she drinks about 3.0 ounces of alcohol per week. She reports that she does not use illicit drugs.  family history includes Breast cancer in her paternal aunt; Colon cancer in her maternal uncle; Coronary artery disease in her father; Heart attack (age of onset: 71) in her paternal uncle; Hemolytic uremic syndrome in an unspecified family member; Hypertension in her brother; Kidney cancer in her brother; Lung cancer in her brother; Lymphoma in her mother; Prostate cancer in her brother; Skin cancer in her brother; and Ulcers in her daughter.  Allergies  Allergen Reactions  . Codeine     REACTION: violently ill with N&V  . Norvasc (Amlodipine Besylate)     Nausea & vomiting  . Vasotec     cough       PHYSICAL EXAMINATION: Vital signs: BP 128/64  Pulse 80  Ht 5\' 1"  (1.549 m)  Wt 110 lb (49.896 kg)  BMI 20.8 kg/m2  Constitutional: generally well-appearing, no acute distress Psychiatric: alert and oriented x3, cooperative Eyes: extraocular movements intact, anicteric,  conjunctiva pink Mouth: oral pharynx moist, no lesions Neck: supple no lymphadenopathy Cardiovascular: heart regular rate and rhythm, no murmur Lungs: clear to auscultation bilaterally Abdomen: soft, nontender, nondistended, no obvious ascites, no peritoneal signs, normal bowel sounds, no organomegaly Rectal: Deferred until colonoscopy Extremities: no lower extremity edema bilaterally Skin: no lesions on visible extremities Neuro: No focal deficits.   ASSESSMENT:  #1. Hemoccult-positive stool without anemia. May be  secondary to known AVMs or hemorrhoids. Rule out other causes such as neoplasia. #2. History of multiple diminutive adenomas July 2010 #3. Known cecal AVMs #4. GERD. Recent problems with regurgitation and substernal chest burning #5. 10 pound weight loss of uncertain etiology   PLAN:  #1. Colonoscopy to provide colorectal neoplasia surveillance and evaluation of Hemoccult-positive stool.The nature of the procedure, as well as the risks, benefits, and alternatives were carefully and thoroughly reviewed with the patient. Ample time for discussion and questions allowed. The patient understood, was satisfied, and agreed to proceed. #2. Movi prep prescribed. The patient instructed on its use #3. Reflux precautions #4. Use PPI more regularly if reflux symptoms progressively problematic #5. Schedule upper endoscopy to evaluate accelerated reflux symptoms, weight loss, and Hemoccult-positive stool.The nature of the procedure, as well as the risks, benefits, and alternatives were carefully and thoroughly reviewed with the patient. Ample time for discussion and questions allowed. The patient understood, was satisfied, and agreed to proceed. We will perform this at the same time as her colonoscopy for patient's convenience and efficiency

## 2012-07-04 NOTE — Patient Instructions (Addendum)

## 2012-07-28 ENCOUNTER — Encounter: Payer: Self-pay | Admitting: Internal Medicine

## 2012-07-28 ENCOUNTER — Encounter: Payer: Medicare Other | Admitting: Internal Medicine

## 2012-07-28 ENCOUNTER — Ambulatory Visit (AMBULATORY_SURGERY_CENTER): Payer: Medicare Other | Admitting: Internal Medicine

## 2012-07-28 VITALS — BP 155/81 | HR 74 | Temp 98.2°F | Resp 16 | Ht 61.0 in | Wt 110.0 lb

## 2012-07-28 DIAGNOSIS — K5730 Diverticulosis of large intestine without perforation or abscess without bleeding: Secondary | ICD-10-CM

## 2012-07-28 DIAGNOSIS — D126 Benign neoplasm of colon, unspecified: Secondary | ICD-10-CM

## 2012-07-28 DIAGNOSIS — K219 Gastro-esophageal reflux disease without esophagitis: Secondary | ICD-10-CM

## 2012-07-28 DIAGNOSIS — R195 Other fecal abnormalities: Secondary | ICD-10-CM

## 2012-07-28 DIAGNOSIS — Z860100 Personal history of colon polyps, unspecified: Secondary | ICD-10-CM

## 2012-07-28 DIAGNOSIS — K222 Esophageal obstruction: Secondary | ICD-10-CM

## 2012-07-28 HISTORY — PX: UPPER GASTROINTESTINAL ENDOSCOPY: SHX188

## 2012-07-28 MED ORDER — SODIUM CHLORIDE 0.9 % IV SOLN: 500.0000 mL | INTRAVENOUS | Status: DC

## 2012-07-28 NOTE — Progress Notes (Signed)
Called to room to assist during endoscopic procedure.  Patient ID and intended procedure confirmed with present staff. Received instructions for my participation in the procedure from the performing physician.  

## 2012-07-28 NOTE — Progress Notes (Signed)
Patient did not experience any of the following events: a burn prior to discharge; a fall within the facility; wrong site/side/patient/procedure/implant event; or a hospital transfer or hospital admission upon discharge from the facility. (G8907) Patient did not have preoperative order for IV antibiotic SSI prophylaxis. (G8918)  

## 2012-07-28 NOTE — Op Note (Signed)
[facility] [address] [place], [address]   ENDOSCOPY PROCEDURE REPORT  PATIENT: Vicki, Garcia  MR#: [id number] BIRTHDATE: 06/06/1935 , 77  yrs. old GENDER: Female ENDOSCOPIST: Roxy Cedar, MD REFERRED BY:  Marga Melnick, M.D. PROCEDURE DATE:  07/28/2012 PROCEDURE:  EGD, diagnostic ASA CLASS:     Class II INDICATIONS:  History of esophageal reflux.   Heme positive stool. Weight loss. MEDICATIONS: MAC sedation, administered by CRNA and propofol (Diprivan) 160mg  IV TOPICAL ANESTHETIC: Cetacaine Spray  DESCRIPTION OF PROCEDURE: After the risks benefits and alternatives of the procedure were thoroughly explained, informed consent was obtained.  The LB [id number] endoscope was introduced through the mouth and advanced to the third portion of the duodenum. Without limitations.  The instrument was slowly withdrawn as the mucosa was fully examined.    EXAM: The upper, middle and distal third of the esophagus were carefully inspected and no abnormalities were noted.  The z-line was well seen at the GEJ.  The endoscope was pushed into the fundus which was normal including a retroflexed view.  The antrum, gastric body, first and second part of the duodenum were unremarkable. Retroflexed views revealed a hiatal hernia.     The scope was then withdrawn from the patient and the procedure completed.  COMPLICATIONS: There were no complications.  ENDOSCOPIC IMPRESSION: 1. Benign distal esophageal stricture (peptic) and reflux esophagitis 2. Otherwise Normal EGD 3. GERD  RECOMMENDATIONS: 1.  Anti-reflux regimen to be followed 2.  Recommend Prilosec OTC 20 mg daily  REPEAT EXAM:  eSigned:  Roxy Cedar, MD 07/28/2012 11:46 AM   FA:[id number] Minerva Ends, MD and The Patient

## 2012-07-28 NOTE — Op Note (Signed)
[facility] [address] [place], [address]   COLONOSCOPY PROCEDURE REPORT  PATIENT: Belanna, Manring  MR#: [id number] BIRTHDATE: [birth year] , 77  yrs. old GENDER: Female ENDOSCOPIST: Roxy Cedar, MD REFERRED VH:QIONGEX Alwyn Ren, M.D. PROCEDURE DATE:  07/28/2012 PROCEDURE:   Colonoscopy with snare polypectomy x 1 First Screening Colonoscopy - Avg.  risk and is 50 yrs.  old or older - No.  Prior Negative Screening - Now for repeat screening. N/A  History of Adenoma - Now for follow-up colonoscopy & has been > or = to 3 yrs.  Yes hx of adenoma.  Has been 3 or more years since last colonoscopy.  Polyps Removed Today? Yes. ASA CLASS:   Class II INDICATIONS:heme-positive stool and Patient's personal history of adenomatous colon polyps (small TAs 07-2008). MEDICATIONS: MAC sedation, administered by CRNA and propofol (Diprivan) 240mg  IV  DESCRIPTION OF PROCEDURE:   After the risks benefits and alternatives of the procedure were thoroughly explained, informed consent was obtained.  A digital rectal exam revealed no abnormalities of the rectum.   The LB [id number]  endoscope was introduced through the anus and advanced to the cecum, which was identified by both the appendix and ileocecal valve. No adverse events experienced.   The quality of the prep was excellent, using MoviPrep  The instrument was then slowly withdrawn as the colon was fully examined.   COLON FINDINGS: Multiple nonbleeding AVM's were noted at the cecum. Moderate diverticulosis  was in the right colon and sigmoid colon. A diminutive polyp was found in the rectum and cold snare polypectomy was performed.  The resection was complete and the polyp tissue was completely retrieved.   The colon mucosa was otherwise normal.  Retroflexed views revealed internal hemorrhoids. The time to cecum=4 min 46 sec.  Withdrawal time=9 min 32 sec.  The scope was withdrawn and the procedure  completed.  COMPLICATIONS: There were no complications.  ENDOSCOPIC IMPRESSION: 1.   Diverticulosis was noted at the cecum 2.   Moderate diverticulosis was noted in the right colon and sigmoid colon 3.   Diminutive polyp was found in the rectum; polypectomy was performed with a cold snare 4.   The colon mucosa was otherwise normal  RECOMMENDATIONS: 1.  Return to the care of your primary provider.  GI follow up as needed. No routine followup colonoscopy recommended 2.  Upper endoscopy today (SEE REPORT)   eSigned:  Roxy Cedar, MD 07/28/2012 11:41 AM cc: Pecola Lawless, MD and The Patient   PATIENT NAME:  Vicki Garcia, Vicki Garcia MR#: [id number]

## 2012-07-28 NOTE — Patient Instructions (Addendum)
YOU HAD AN ENDOSCOPIC PROCEDURE TODAY AT THE [facility]: Refer to the procedure report that was given to you for any specific questions about what was found during the examination.  If the procedure report does not answer your questions, please call your gastroenterologist to clarify.  If you requested that your care partner not be given the details of your procedure findings, then the procedure report has been included in a sealed envelope for you to review at your convenience later.  YOU SHOULD EXPECT: Some feelings of bloating in the abdomen. Passage of more gas than usual.  Walking can help get rid of the air that was put into your GI tract during the procedure and reduce the bloating. If you had a lower endoscopy (such as a colonoscopy or flexible sigmoidoscopy) you may notice spotting of blood in your stool or on the toilet paper. If you underwent a bowel prep for your procedure, then you may not have a normal bowel movement for a few days.  DIET: Your first meal following the procedure should be a light meal and then it is ok to progress to your normal diet.  A half-sandwich or bowl of soup is an example of a good first meal.  Heavy or fried foods are harder to digest and may make you feel nauseous or bloated.  Likewise meals heavy in dairy and vegetables can cause extra gas to form and this can also increase the bloating.  Drink plenty of fluids but you should avoid alcoholic beverages for 24 hours.  ACTIVITY: Your care partner should take you home directly after the procedure.  You should plan to take it easy, moving slowly for the rest of the day.  You can resume normal activity the day after the procedure however you should NOT DRIVE or use heavy machinery for 24 hours (because of the sedation medicines used during the test).    SYMPTOMS TO REPORT IMMEDIATELY: A gastroenterologist can be reached at any hour.  During normal business hours, 8:30 AM to 5:00 PM Monday through Friday,  call [phone].  After hours and on weekends, please call the GI answering service at [phone] who will take a message and have the physician on call contact you.   Following lower endoscopy (colonoscopy or flexible sigmoidoscopy):  Excessive amounts of blood in the stool  Significant tenderness or worsening of abdominal pains  Swelling of the abdomen that is new, acute  Fever of 100F or higher  Following upper endoscopy (EGD)  Vomiting of blood or coffee ground material  New chest pain or pain under the shoulder blades  Painful or persistently difficult swallowing  New shortness of breath  Fever of 100F or higher  Black, tarry-looking stools  Stricture, esophagitis, GERD, polyp, diverticulosis, high fiber diet informtion given.  Recommend Prilosec, over the counter, 20 mg. Daily.  FOLLOW UP: If any biopsies were taken you will be contacted by phone or by letter within the next 1-3 weeks.  Call your gastroenterologist if you have not heard about the biopsies in 3 weeks.  Our staff will call the home number listed on your records the next business day following your procedure to check on you and address any questions or concerns that you may have at that time regarding the information given to you following your procedure. This is a courtesy call and so if there is no answer at the home number and we have not heard from you through the emergency physician on call,  we will assume that you have returned to your regular daily activities without incident.  SIGNATURES/CONFIDENTIALITY: You and/or your care partner have signed paperwork which will be entered into your electronic medical record.  These signatures attest to the fact that that the information above on your After Visit Summary has been reviewed and is understood.  Full responsibility of the confidentiality of this discharge information lies with you and/or your care-partner.

## 2012-07-28 NOTE — Progress Notes (Signed)
Procedure ends, to recovery, report given and VSS. 

## 2012-07-28 NOTE — Progress Notes (Signed)
No egg or soy allergy. ewm 

## 2012-07-29 ENCOUNTER — Telehealth: Payer: Self-pay | Admitting: *Deleted

## 2012-07-29 NOTE — Telephone Encounter (Signed)
  Follow up Call-  Call back number 07/28/2012  Post procedure Call Back phone  # [phone]  Permission to leave phone message Yes     Patient questions:  Do you have a fever, pain , or abdominal swelling? no Pain Score  0 *  Have you tolerated food without any problems? yes  Have you been able to return to your normal activities? yes  Do you have any questions about your discharge instructions: Diet   no Medications  no Follow up visit  no  Do you have questions or concerns about your Care? no  Actions: * If pain score is 4 or above: No action needed, pain <4.  Pt. States she had some rectal bleeding last night-bright red, smalll amount.  Has history of bleeding hemorrhoids.  Will call us back if bleeding increases.  Denies any upper GI discomfort or dysphagia.

## 2012-08-02 ENCOUNTER — Encounter: Payer: Self-pay | Admitting: Internal Medicine

## 2012-08-24 ENCOUNTER — Other Ambulatory Visit: Payer: Self-pay

## 2012-08-24 ENCOUNTER — Encounter: Payer: Self-pay | Admitting: Internal Medicine

## 2012-08-24 NOTE — Telephone Encounter (Signed)
Patient needs to have Lipid/Hep/CK 272.4/995.20, last Lipid check was 07/17/11. Please advise if you feel patient should schedule CPX

## 2012-08-28 ENCOUNTER — Other Ambulatory Visit: Payer: Self-pay | Admitting: Internal Medicine

## 2012-08-28 ENCOUNTER — Encounter: Payer: Self-pay | Admitting: Internal Medicine

## 2012-08-28 DIAGNOSIS — H9319 Tinnitus, unspecified ear: Secondary | ICD-10-CM

## 2012-08-30 ENCOUNTER — Telehealth: Payer: Self-pay | Admitting: *Deleted

## 2012-08-30 MED ORDER — CLONAZEPAM 1 MG PO TABS: ORAL_TABLET | ORAL | Status: DC

## 2012-08-30 NOTE — Telephone Encounter (Signed)
Rx refilled and printed ag cma.

## 2012-08-30 NOTE — Telephone Encounter (Signed)
OK X1 

## 2012-08-30 NOTE — Telephone Encounter (Signed)
Referral for tinnitus faxed to ENT office at # [phone].

## 2012-08-30 NOTE — Telephone Encounter (Signed)
Pharmacy is requesting a refill for clonazepam 1 mg. Last ov 05/25/12 last date filled 12/01/11 # 30 3 R Last UDS 05/25/12 patient is low risk.  Has contract on file.   Please Advise Ag cma

## 2012-09-29 ENCOUNTER — Other Ambulatory Visit: Payer: Self-pay | Admitting: Internal Medicine

## 2012-09-30 NOTE — Telephone Encounter (Signed)
Letter mailed to schedule an OV.     KP 

## 2012-10-05 ENCOUNTER — Encounter: Payer: Self-pay | Admitting: Internal Medicine

## 2012-10-27 ENCOUNTER — Encounter: Payer: Self-pay | Admitting: Obstetrics

## 2012-10-27 ENCOUNTER — Ambulatory Visit (INDEPENDENT_AMBULATORY_CARE_PROVIDER_SITE_OTHER): Payer: Medicare Other | Admitting: Obstetrics

## 2012-10-27 VITALS — BP 142/83 | HR 82 | Temp 98.0°F | Ht 61.0 in | Wt 110.0 lb

## 2012-10-27 DIAGNOSIS — Z01419 Encounter for gynecological examination (general) (routine) without abnormal findings: Secondary | ICD-10-CM

## 2012-10-27 NOTE — Progress Notes (Signed)
Subjective:   Patient not seen by provider.  Vicki Garcia is a 77 y.o. female here for a routine exam.  Current complaints: annual exam.  Personal health questionnaire reviewed: yes.   Gynecologic History No LMP recorded. Patient is postmenopausal. Contraception: post menopausal status Last Pap: 2011. Results were: normal Last mammogram: 08/2012. Results were: normal  Obstetric History OB History  No data available     The following portions of the patient's history were reviewed and updated as appropriate: allergies, current medications, past family history, past medical history, past social history, past surgical history and problem list.

## 2012-11-02 ENCOUNTER — Ambulatory Visit (INDEPENDENT_AMBULATORY_CARE_PROVIDER_SITE_OTHER): Payer: Medicare Other | Admitting: Obstetrics

## 2012-11-02 ENCOUNTER — Encounter: Payer: Self-pay | Admitting: Obstetrics

## 2012-11-02 VITALS — BP 112/61 | HR 69 | Temp 97.0°F | Wt 111.0 lb

## 2012-11-02 DIAGNOSIS — Z124 Encounter for screening for malignant neoplasm of cervix: Secondary | ICD-10-CM

## 2012-11-02 DIAGNOSIS — G4700 Insomnia, unspecified: Secondary | ICD-10-CM

## 2012-11-02 DIAGNOSIS — Z01419 Encounter for gynecological examination (general) (routine) without abnormal findings: Secondary | ICD-10-CM

## 2012-11-02 MED ORDER — ZALEPLON 5 MG PO CAPS: 5.0000 mg | ORAL_CAPSULE | Freq: Every day | ORAL | Status: DC

## 2012-11-02 NOTE — Progress Notes (Signed)
Subjective:   Vicki Garcia is a 77 y.o. female here for a routine exam. Current complaints: annual exam.  Has difficulty sleeping.  Personal health questionnaire reviewed: yes.   Gynecologic History  No LMP recorded. Patient is postmenopausal.  Contraception: post menopausal status  Last Pap: 2011. Results were: normal  Last mammogram: 08/2012. Results were: normal   Obstetric History   OB History   No data available   The following portions of the patient's history were reviewed and updated as appropriate: allergies, current medications, past family history, past medical history, past social history, past surgical history and problem list.   Review of Systems  Pertinent items are noted in HPI.   Objective:     General appearance: alert and no distress Breasts: normal appearance, no masses or tenderness Abdomen: normal findings: soft, non-tender Pelvic: cervix normal in appearance, external genitalia normal, no adnexal masses or tenderness, no cervical motion tenderness, uterus normal size, shape, and consistency and vagina normal without discharge   Assessment:   Healthy female exam.  Insomnia  Plan:   Education reviewed: calcium supplements, self breast exams and menopause. Follow up in: 2 years. [facility]

## 2012-11-02 NOTE — Addendum Note (Signed)
Addended by: Coral Ceo A on: 11/02/2012 11:45 AM   Modules accepted: Orders

## 2012-11-03 LAB — PAP IG W/ RFLX HPV ASCU

## 2012-11-04 ENCOUNTER — Encounter: Payer: Self-pay | Admitting: Internal Medicine

## 2012-11-04 ENCOUNTER — Other Ambulatory Visit: Payer: Self-pay | Admitting: *Deleted

## 2012-11-04 ENCOUNTER — Telehealth: Payer: Self-pay | Admitting: Internal Medicine

## 2012-11-04 DIAGNOSIS — E785 Hyperlipidemia, unspecified: Secondary | ICD-10-CM

## 2012-11-04 DIAGNOSIS — T887XXA Unspecified adverse effect of drug or medicament, initial encounter: Secondary | ICD-10-CM

## 2012-11-04 NOTE — Telephone Encounter (Signed)
Please advise 

## 2012-11-04 NOTE — Telephone Encounter (Signed)
Patient called and schedule a fasting lab appt for October 21 @8 :30am because she received a letter in the mail form dr hopper stating it was time. Please advise me with lab orders thanks

## 2012-11-04 NOTE — Telephone Encounter (Signed)
Lab orders entered

## 2012-11-04 NOTE — Telephone Encounter (Signed)
Please  schedule fasting Labs : Lipids, hepatic panel Codes: 272.4, 995.20

## 2012-11-08 ENCOUNTER — Other Ambulatory Visit (INDEPENDENT_AMBULATORY_CARE_PROVIDER_SITE_OTHER): Payer: Medicare Other

## 2012-11-08 ENCOUNTER — Encounter: Payer: Self-pay | Admitting: Family Medicine

## 2012-11-08 ENCOUNTER — Ambulatory Visit (INDEPENDENT_AMBULATORY_CARE_PROVIDER_SITE_OTHER): Payer: Medicare Other | Admitting: Family Medicine

## 2012-11-08 VITALS — BP 120/72 | HR 78 | Temp 97.9°F | Resp 16 | Wt 110.5 lb

## 2012-11-08 DIAGNOSIS — E785 Hyperlipidemia, unspecified: Secondary | ICD-10-CM

## 2012-11-08 DIAGNOSIS — T887XXA Unspecified adverse effect of drug or medicament, initial encounter: Secondary | ICD-10-CM

## 2012-11-08 DIAGNOSIS — J019 Acute sinusitis, unspecified: Secondary | ICD-10-CM

## 2012-11-08 LAB — LIPID PANEL
Cholesterol: 162 mg/dL (ref 0–200)
HDL: 63.2 mg/dL (ref >39.00)
Total CHOL/HDL Ratio: 3
Triglycerides: 65 mg/dL (ref 0.0–149.0)
VLDL: 13 mg/dL (ref 0.0–40.0)

## 2012-11-08 LAB — HEPATIC FUNCTION PANEL
ALT: 22 U/L (ref 0–35)
AST: 29 U/L (ref 0–37)
Albumin: 4.1 g/dL (ref 3.5–5.2)

## 2012-11-08 MED ORDER — AMOXICILLIN-POT CLAVULANATE 875-125 MG PO TABS: 1.0000 | ORAL_TABLET | Freq: Two times a day (BID) | ORAL | Status: DC

## 2012-11-08 NOTE — Progress Notes (Signed)
  Subjective:     Vicki Garcia is a 77 y.o. female who presents for evaluation of sinus pain. Symptoms include: congestion, facial pain, foul rhinorrhea, headaches, post nasal drip and sinus pressure. Onset of symptoms was 2 weeks ago. Symptoms have been gradually worsening since that time. Past history is significant for no history of pneumonia or bronchitis. Patient is a former smoker.  The following portions of the patient's history were reviewed and updated as appropriate: allergies, current medications, past family history, past medical history, past social history, past surgical history and problem list.  Review of Systems Pertinent items are noted in HPI.   Objective:    BP 120/72  Pulse 78  Temp(Src) 97.9 F (36.6 C) (Oral)  Resp 16  Wt 110 lb 8 oz (50.122 kg)  BMI 20.89 kg/m2  SpO2 97% General appearance: alert, cooperative, appears stated age and no distress Ears: normal TM's and external ear canals both ears Nose: green discharge, moderate congestion, turbinates red, swollen, sinus tenderness bilateral Throat: abnormal findings: pnd Neck: no adenopathy, supple, symmetrical, trachea midline and thyroid not enlarged, symmetric, no tenderness/mass/nodules Lungs: clear to auscultation bilaterally Heart: S1, S2 normal    Assessment:    Acute bacterial sinusitis.    Plan:    Nasal saline sprays. Nasal steroids per medication orders. Antihistamines per medication orders. Augmentin per medication orders.

## 2012-11-08 NOTE — Patient Instructions (Signed)

## 2012-11-10 ENCOUNTER — Ambulatory Visit (INDEPENDENT_AMBULATORY_CARE_PROVIDER_SITE_OTHER): Payer: Medicare Other

## 2012-11-10 DIAGNOSIS — Z23 Encounter for immunization: Secondary | ICD-10-CM

## 2012-11-14 ENCOUNTER — Ambulatory Visit (INDEPENDENT_AMBULATORY_CARE_PROVIDER_SITE_OTHER): Payer: Medicare Other | Admitting: Internal Medicine

## 2012-11-14 ENCOUNTER — Encounter: Payer: Self-pay | Admitting: Internal Medicine

## 2012-11-14 VITALS — BP 110/62 | HR 77 | Ht 60.5 in | Wt 110.0 lb

## 2012-11-14 DIAGNOSIS — J302 Other seasonal allergic rhinitis: Secondary | ICD-10-CM

## 2012-11-14 DIAGNOSIS — J309 Allergic rhinitis, unspecified: Secondary | ICD-10-CM

## 2012-11-14 MED ORDER — METHYLPREDNISOLONE ACETATE 80 MG/ML IJ SUSP
80.0000 mg | Freq: Once | INTRAMUSCULAR | Status: AC
  Administered 2012-11-14: 80 mg via INTRAMUSCULAR

## 2012-11-14 NOTE — Patient Instructions (Signed)
Depo 80  Try otc nasal saline gel to soothe and prevent over-drying in your nose.  Any time after about 3 weeks from your flu shot, I recommend you get the pneumococcal pneumonia conjugate vaccine-13, one time.

## 2012-11-14 NOTE — Progress Notes (Signed)
04/30/11- 29 yoF referred courtesy of Dr Alwyn Ren for allergy evaluation. She complains of sinus congestion with postnasal drainage "every other month". This has been going on over a year. She has seen Dr. Edd Fabian who treated with antibiotics. These help for a month at a time but then her nose starts to burn, drainage begins and head congestion gets worse. CT scan of the sinuses on 04/21/2011 showed bilateral chronic maxillary sinusitis. She denies any previously recognized allergy problem. 2 years ago black mold was noted around duct work in her home and remediated - symptoms got worse after that. She felt better while traveling in [place]. There has been no chest discomfort. Little sneeze or itch. No problems with  ears. She denies urticaria or atopic dermatitis. There has been no ENT surgery. Environment- house with remediation of black mold 2 years ago. Dry. 2 dogs in. No smokers. She is married, self-employed, works at home.  06/11/11- 76 yoF followed for allergic rhinitis Drainage that burns throat, sinus troubles,feels bad all over. She feels that her head is in a drum. Postnasal drip. Denies cough or wheeze.   CT scan of sinuses 04/21/2011-chronic maxillary sinusitis. Images reviewed with her. Allergy Profile  Total IgE < 1.5, negative.   08/11/11- 76 yoF followed for allergic rhinitis Pt states having  issues with eyes being red and itchy,along with sinus drainage.. her ophthalmologist gave her an OTC eyedrop which helps some. She is on other eyedrops typically used for glaucoma, but says they are being given for "a vein occlusion" and that she does not have glaucoma. Burning postnasal drip. Has tried Claritin and Singulair. Used Dymista sample then went back to [employer]. CT sinus 04/21/11- IMPRESSION:  Mild chronic sinusitis bilateral maxillary sinus.  Original Report Authenticated By: Camelia Phenes, M.D.   11/12/11- 76 yoF followed for allergic rhinitis/ conjunctivitis c/o feeling  bad,woke up today w/hoarseness,cough-dry,,no wheezing,no sob,no fcs,sinus pr. Dry cough this week. Mild sore throat, no fever. This fall increased seasonal rhinitis. Using Dymista and singulair.  11/14/12- 77 yoF followed for allergic rhinitis/ conjunctivitis For 2 weeks she has been having facial pressure, nose burns, raspy voice, feels tired overall, PND. Placed on augmentin by PA for her PCP.  Has been raking leaves-nose is burning. Retro-orbital and frontal headache. Throat irritated. Feels postnasal drip but blows out nothing. No effect from Augmentin. No sick exposure. Outdoors frequently. Light cough. Not using Flonase/Astelin which she thinks would make it worse.  ROS-see HPI Constitutional:   No-   weight loss, night sweats, fevers, chills, fatigue, lassitude. HEENT:   No-  headaches, difficulty swallowing, tooth/dental problems, sore throat,       No-  sneezing, itching, ear ache,   +nasal congestion, +post nasal drip,  CV:  No-   chest pain, orthopnea, PND, swelling in lower extremities, anasarca,  dizziness, palpitations Resp: No-   shortness of breath with exertion or at rest.              No-   productive cough,  + non-productive cough,  No- coughing up of blood.              No-   change in color of mucus.  No- wheezing.   Skin: No-   rash or lesions. GI:  No-   heartburn, indigestion, abdominal pain, nausea, vomiting,  GU:   pain.MS:  No-   joint pain or swelling. . Neuro-     nothing unusual Psych:  No- change in mood or affect. No  depression or anxiety.  No memory loss.  OBJ- Physical Exam General- Alert, Oriented, Affect-appropriate, Distress- none acute, slender Skin- rash-none, lesions- none, excoriation- none Lymphadenopathy- none Head- atraumatic            Eyes- Gross vision intact, PERRLA, .            Ears- Hearing, canals-normal. TMs look retracted without fluid.            Nose- + pale mucosa, no-Septal dev,  polyps, erosion, perforation             Throat-  Mallampati II , mucosa+ red , drainage- none, tonsils- atrophic.  Neck- flexible , trachea midline, no stridor , thyroid nl, carotid no bruit Chest - symmetrical excursion , unlabored           Heart/CV- RRR , no murmur , no gallop  , no rub, nl s1 s2                           - JVD- none , edema- none, stasis changes- none, varices- none           Lung- clear, wheeze- none, cough- none , dullness-none, rub- none           Chest wall-  Abd-  Br/ Gen/ Rectal- Not done, not indicated Extrem- cyanosis- none, clubbing, none, atrophy- none, strength- nl Neuro- grossly intact to observation

## 2012-11-22 ENCOUNTER — Other Ambulatory Visit: Payer: Self-pay | Admitting: Internal Medicine

## 2012-11-22 NOTE — Telephone Encounter (Signed)
Valsartan-HCTZ refill sent to [facility]cy

## 2012-11-27 NOTE — Assessment & Plan Note (Signed)
Acute rhinitis she associates with raking leaves. It sounds more like a viral or ureteric rhinitis.  Plan-Depo-Medrol, nasal saline gel. Wear a mask working outdoors

## 2012-12-07 ENCOUNTER — Other Ambulatory Visit: Payer: Self-pay | Admitting: Internal Medicine

## 2012-12-07 ENCOUNTER — Other Ambulatory Visit: Payer: Self-pay | Admitting: Obstetrics

## 2012-12-13 ENCOUNTER — Telehealth: Payer: Self-pay | Admitting: *Deleted

## 2012-12-29 ENCOUNTER — Other Ambulatory Visit: Payer: Self-pay | Admitting: *Deleted

## 2013-01-03 ENCOUNTER — Encounter: Payer: Self-pay | Admitting: Obstetrics

## 2013-01-26 ENCOUNTER — Telehealth: Payer: Self-pay | Admitting: Internal Medicine

## 2013-01-26 MED ORDER — METHYLPREDNISOLONE 8 MG PO TABS: ORAL_TABLET | ORAL | Status: DC

## 2013-01-26 NOTE — Telephone Encounter (Signed)
i called and spoke with pt. Aware of recs and RX sent. Nothing further needed

## 2013-01-26 NOTE — Telephone Encounter (Signed)
Last OV 11/14/12, next OV prn: Pt states she is having the same symptoms as she had at ov on 11-14-12. She states at that time she was given a shout and symptoms resolved. Pt states x last 5 days she has been having sinus congsetion, pressure, pain, headache, sore throat, PND, and overall feels lousy. Pt has been using nasal saline and all meds as prescribed. Please advise. Gar[place] Bing, CMA Allergies  Allergen Reactions  . Codeine     REACTION: violently ill with N&V  . Norvasc [Amlodipine Besylate]     Nausea & vomiting  . Vasotec     cough   Current Outpatient Prescriptions on File Prior to Visit  Medication Sig Dispense Refill  . amoxicillin-clavulanate (AUGMENTIN) 875-125 MG per tablet Take 1 tablet by mouth 2 (two) times daily.  20 tablet  0  . atorvastatin (LIPITOR) 20 MG tablet TAKE (1/2) TABLET DAILY.  30 tablet  3  . azelastine (ASTELIN) 137 MCG/SPRAY nasal spray Place 1 spray into the nose 2 (two) times daily as needed. Use in each nostril as directed      . clonazePAM (KLONOPIN) 1 MG tablet 1/2 qhs prn  30 tablet  0  . fluticasone (FLONASE) 50 MCG/ACT nasal spray Place 2 sprays into the nose as needed.      . latanoprost (XALATAN) 0.005 % ophthalmic solution as directed.       . meclizine (ANTIVERT) 25 MG tablet Take 1 tablet (25 mg total) by mouth 3 (three) times daily as needed.  30 tablet  0  . omeprazole (PRILOSEC) 20 MG capsule Take 20 mg by mouth as needed.      . valsartan-hydrochlorothiazide (DIOVAN-HCT) 160-12.5 MG per tablet TAKE 1 TABLET ONCE DAILY.  90 tablet  1  . zaleplon (SONATA) 5 MG capsule Take 5 mg by mouth daily as needed.       No current facility-administered medications on file prior to visit.

## 2013-01-26 NOTE — Telephone Encounter (Signed)
Offer medrol 8 mg, # 10, 2 daily x 5 days.

## 2013-01-31 ENCOUNTER — Telehealth: Payer: Self-pay | Admitting: Internal Medicine

## 2013-01-31 MED ORDER — DOXYCYCLINE HYCLATE 100 MG PO TABS: ORAL_TABLET | ORAL | Status: DC

## 2013-01-31 NOTE — Telephone Encounter (Signed)
Pt is aware of CY recs. Rx has been sent in. 

## 2013-01-31 NOTE — Telephone Encounter (Signed)
Offer doxycycline 100 mg, # 8, 2 today then one daily 

## 2013-01-31 NOTE — Telephone Encounter (Signed)
Spoke with pt. States that she is currently on her 5th day of prednisone and has gotten minimal relief. Still reports head and sinus congestion, PND, coughing and headache. Wonders if she needs an antibiotic.  Allergies  Allergen Reactions  . Codeine     REACTION: violently ill with N&V  . Norvasc [Amlodipine Besylate]     Nausea & vomiting  . Vasotec     cough    Current Outpatient Prescriptions on File Prior to Visit  Medication Sig Dispense Refill  . amoxicillin-clavulanate (AUGMENTIN) 875-125 MG per tablet Take 1 tablet by mouth 2 (two) times daily.  20 tablet  0  . atorvastatin (LIPITOR) 20 MG tablet TAKE (1/2) TABLET DAILY.  30 tablet  3  . azelastine (ASTELIN) 137 MCG/SPRAY nasal spray Place 1 spray into the nose 2 (two) times daily as needed. Use in each nostril as directed      . clonazePAM (KLONOPIN) 1 MG tablet 1/2 qhs prn  30 tablet  0  . fluticasone (FLONASE) 50 MCG/ACT nasal spray Place 2 sprays into the nose as needed.      . latanoprost (XALATAN) 0.005 % ophthalmic solution as directed.       . meclizine (ANTIVERT) 25 MG tablet Take 1 tablet (25 mg total) by mouth 3 (three) times daily as needed.  30 tablet  0  . methylPREDNISolone (MEDROL) 8 MG tablet Take 2 tabs daily x 5 days  10 tablet  0  . omeprazole (PRILOSEC) 20 MG capsule Take 20 mg by mouth as needed.      . valsartan-hydrochlorothiazide (DIOVAN-HCT) 160-12.5 MG per tablet TAKE 1 TABLET ONCE DAILY.  90 tablet  1  . zaleplon (SONATA) 5 MG capsule Take 5 mg by mouth daily as needed.       No current facility-administered medications on file prior to visit.    CY - please advise. Thanks.

## 2013-02-06 ENCOUNTER — Encounter: Payer: Self-pay | Admitting: Internal Medicine

## 2013-02-27 ENCOUNTER — Encounter: Payer: Self-pay | Admitting: Internal Medicine

## 2013-04-28 ENCOUNTER — Telehealth: Payer: Self-pay | Admitting: Internal Medicine

## 2013-04-28 NOTE — Telephone Encounter (Signed)
lmomtcb x1 for pt 

## 2013-05-01 NOTE — Telephone Encounter (Signed)
Ok to schedule for allergy skin testing

## 2013-05-01 NOTE — Telephone Encounter (Signed)
LMTCB- patient has been scheduled for allergy skin testing on Wednesday 05-31-13 at 3:30pm. Pt needs to be reminded that she can not be on any antihistamines, OTC cough syrups, or OTC sleep aids 3 days prior to testing. If patient should have a concern/question about a medication prior to testing then she should call no later than Friday 05-26-13. Thanks

## 2013-05-01 NOTE — Telephone Encounter (Signed)
Pt returned my call; I have spoken with about allergy skin testing. She is aware of date and time of test as well as the allergy protocol. Nothing more needed at this time.

## 2013-05-01 NOTE — Telephone Encounter (Signed)
LMTCBx2 on home and cell #. [place] Bing, [place]

## 2013-05-01 NOTE — Telephone Encounter (Signed)
Last OV 11/14/12, next OV 1 year. I spoke with the pt and she is asking about scheduling allergy testing appt. She states she keep having same symptoms that keep occuring despite abx treatments. She states it always starts wth a runny nose and then continues from there. Pt wanted to know what Dr. Annamaria Boots thought about her having allergy testing done. Please advise. [place] Bing, CMA Allergies  Allergen Reactions  . Codeine     REACTION: violently ill with N&V  . Norvasc [Amlodipine Besylate]     Nausea & vomiting  . Vasotec     cough

## 2013-05-10 ENCOUNTER — Encounter: Payer: Self-pay | Admitting: Internal Medicine

## 2013-05-29 ENCOUNTER — Encounter: Payer: Self-pay | Admitting: Internal Medicine

## 2013-05-31 ENCOUNTER — Ambulatory Visit (INDEPENDENT_AMBULATORY_CARE_PROVIDER_SITE_OTHER): Payer: Medicare Other | Admitting: Internal Medicine

## 2013-05-31 ENCOUNTER — Encounter: Payer: Self-pay | Admitting: Internal Medicine

## 2013-05-31 VITALS — BP 118/72 | HR 70 | Ht 60.5 in | Wt 112.2 lb

## 2013-05-31 DIAGNOSIS — H9319 Tinnitus, unspecified ear: Secondary | ICD-10-CM

## 2013-05-31 DIAGNOSIS — H1045 Other chronic allergic conjunctivitis: Secondary | ICD-10-CM

## 2013-05-31 DIAGNOSIS — J3089 Other allergic rhinitis: Principal | ICD-10-CM

## 2013-05-31 DIAGNOSIS — H1010 Acute atopic conjunctivitis, unspecified eye: Secondary | ICD-10-CM

## 2013-05-31 DIAGNOSIS — H9313 Tinnitus, bilateral: Secondary | ICD-10-CM

## 2013-05-31 DIAGNOSIS — J309 Allergic rhinitis, unspecified: Secondary | ICD-10-CM

## 2013-05-31 DIAGNOSIS — H6980 Other specified disorders of Eustachian tube, unspecified ear: Secondary | ICD-10-CM

## 2013-05-31 DIAGNOSIS — J302 Other seasonal allergic rhinitis: Secondary | ICD-10-CM

## 2013-05-31 MED ORDER — AZELASTINE-FLUTICASONE 137-50 MCG/ACT NA SUSP: 1.0000 | Freq: Every day | NASAL | Status: DC

## 2013-05-31 NOTE — Assessment & Plan Note (Signed)
Complaint today of worsening tinnitus. She has had some chronic eustachian dysfunction but canals are clear on exam. Plan-ENT referral for tinnitus

## 2013-05-31 NOTE — Assessment & Plan Note (Signed)
Probably related to her symptoms of the nasopharynx and throat

## 2013-05-31 NOTE — Assessment & Plan Note (Signed)
Conjunctivae are clear and unremarkable on exam today. Plan-return to eyedrops as needed

## 2013-05-31 NOTE — Assessment & Plan Note (Signed)
She came initially believing allergy vaccine might be solution to her intermittent eye and upper respiratory symptoms. Her daughter was on allergy shots. Skin testing today was positive but not dramatic, respecting her age. I attempted to give her realistic expectation and we agreed to wait and watch through the summer. Meanwhile she is given another sample of Dymista for comparison. I noticed her throat clearing and red throat, suggesting that at least some of her symptoms may reflect intermittent reflux although she doesn't recognize it.

## 2013-05-31 NOTE — Patient Instructions (Signed)
Allergy vaccine is an option if needed. We can watch to see how you do this summer.  Sample Dymista nasal spray   1-2 puffs each nostril, once daily at bedtime  Order- referral to [facility]  Dx tinnitus

## 2013-05-31 NOTE — Progress Notes (Signed)
04/30/11- 89 yoF referred courtesy of Dr Linna Darner for allergy evaluation. She complains of sinus congestion with postnasal drainage "every other month". This has been going on over a year. She has seen Dr. Blima Dessert who treated with antibiotics. These help for a month at a time but then her nose starts to burn, drainage begins and head congestion gets worse. CT scan of the sinuses on 04/21/2011 showed bilateral chronic maxillary sinusitis. She denies any previously recognized allergy problem. 2 years ago black mold was noted around duct work in her home and remediated - symptoms got worse after that. She felt better while traveling in [place]. There has been no chest discomfort. Little sneeze or itch. No problems with  ears. She denies urticaria or atopic dermatitis. There has been no ENT surgery. Environment- house with remediation of black mold 2 years ago. Dry. 2 dogs in. No smokers. She is married, self-employed, works at home.  06/11/11- 76 yoF followed for allergic rhinitis Drainage that burns throat, sinus troubles,feels bad all over. She feels that her head is in a drum. Postnasal drip. Denies cough or wheeze.   CT scan of sinuses 04/21/2011-chronic maxillary sinusitis. Images reviewed with her. Allergy Profile  Total IgE < 1.5, negative.   08/11/11- 84 yoF followed for allergic rhinitis Pt states having  issues with eyes being red and itchy,along with sinus drainage.. her ophthalmologist gave her an OTC eyedrop which helps some. She is on other eyedrops typically used for glaucoma, but says they are being given for "a vein occlusion" and that she does not have glaucoma. Burning postnasal drip. Has tried Claritin and Singulair. Used Dymista sample then went back to [facility]. CT sinus 04/21/11- IMPRESSION:  Mild chronic sinusitis bilateral maxillary sinus.  Original Report Authenticated By: Truett Perna, M.D.   11/12/11- 76 yoF followed for allergic rhinitis/ conjunctivitis c/o feeling  bad,woke up today w/hoarseness,cough-dry,,no wheezing,no sob,no fcs,sinus pr. Dry cough this week. Mild sore throat, no fever. This fall increased seasonal rhinitis. Using Dymista and singulair.  11/14/12- 85 yoF followed for allergic rhinitis/ conjunctivitis For 2 weeks she has been having facial pressure, nose burns, raspy voice, feels tired overall, PND. Placed on augmentin by PA for her PCP.  Has been raking leaves-nose is burning. Retro-orbital and frontal headache. Throat irritated. Feels postnasal drip but blows out nothing. No effect from Augmentin. No sick exposure. Outdoors frequently. Light cough. Not using Flonase/Astelin which she thinks would make it worse.  05/31/13- 78 yoF followed for allergic rhinitis/ conjunctivitis, complicated by tinnitus No antihistamines, OTC cough syrups, or OTC sleep aids in past 3 days Returns for allergy skin testing. Today she describes typical symptoms as starting with burning and pressure in the nose, watering eyes, irritation in the throat with initiation of cough. These episodes may happen about once a month but were more sustained repetitive during the winter. She has not recognized triggers although the winter episode might have been viral. She has not seen sustained benefit from Flonase, eyedrops or antihistamines. Today she also complains of tinnitus which has become more disturbing. Allergy Skin Testing 05/31/13- Weakly positive compared to controls, for grass and some tree pollens, ragweed, dust, cat ( no cat), alternaria.  ROS-see HPI Constitutional:   No-   weight loss, night sweats, fevers, chills, fatigue, lassitude. HEENT:   No-  headaches, difficulty swallowing, tooth/dental problems, sore throat,       No-  sneezing, itching, ear ache,   +nasal congestion, +post nasal drip,  CV:  No-  chest pain, orthopnea, PND, swelling in lower extremities, anasarca,  dizziness, palpitations Resp: No-   shortness of breath with exertion or at rest.               No-   productive cough,  + non-productive cough,  No- coughing up of blood.              No-   change in color of mucus.  No- wheezing.   Skin: No-   rash or lesions. GI:  No-   heartburn, indigestion, abdominal pain, nausea, vomiting,  GU:   pain.MS:  No-   joint pain or swelling. . Neuro-     nothing unusual Psych:  No- change in mood or affect. No depression or anxiety.  No memory loss.  OBJ- Physical Exam General- Alert, Oriented, Affect-appropriate, Distress- none acute, slender Skin- rash-none, lesions- none, excoriation- none Lymphadenopathy- none Head- atraumatic            Eyes- Gross vision intact, PERRLA, .            Ears- Hearing, canals-normal. TMs look retracted without fluid.            Nose- + pale mucosa, no-Septal dev,  polyps, erosion, perforation             Throat- Mallampati II , mucosa+ red , drainage- none, tonsils- atrophic.                       +Frequent throat clearing Neck- flexible , trachea midline, no stridor , thyroid nl, carotid no bruit Chest - symmetrical excursion , unlabored           Heart/CV- RRR , no murmur , no gallop  , no rub, nl s1 s2                           - JVD- none , edema- none, stasis changes- none, varices- none           Lung- clear, wheeze- none, cough- none , dullness-none, rub- none           Chest wall-  Abd-  Br/ Gen/ Rectal- Not done, not indicated Extrem- cyanosis- none, clubbing, none, atrophy- none, strength- nl Neuro- grossly intact to observation

## 2013-06-04 ENCOUNTER — Other Ambulatory Visit: Payer: Self-pay | Admitting: Internal Medicine

## 2013-06-20 ENCOUNTER — Encounter: Payer: Self-pay | Admitting: Internal Medicine

## 2013-06-28 ENCOUNTER — Other Ambulatory Visit: Payer: Self-pay | Admitting: Internal Medicine

## 2013-06-28 ENCOUNTER — Encounter: Payer: Self-pay | Admitting: Internal Medicine

## 2013-06-28 ENCOUNTER — Ambulatory Visit (INDEPENDENT_AMBULATORY_CARE_PROVIDER_SITE_OTHER): Payer: Medicare Other | Admitting: Internal Medicine

## 2013-06-28 ENCOUNTER — Other Ambulatory Visit (INDEPENDENT_AMBULATORY_CARE_PROVIDER_SITE_OTHER): Payer: Medicare Other

## 2013-06-28 VITALS — BP 138/72 | HR 64 | Temp 98.3°F | Resp 13 | Ht 60.75 in | Wt 110.4 lb

## 2013-06-28 DIAGNOSIS — E785 Hyperlipidemia, unspecified: Secondary | ICD-10-CM

## 2013-06-28 DIAGNOSIS — D649 Anemia, unspecified: Secondary | ICD-10-CM

## 2013-06-28 DIAGNOSIS — Z86010 Personal history of colonic polyps: Secondary | ICD-10-CM

## 2013-06-28 DIAGNOSIS — I10 Essential (primary) hypertension: Secondary | ICD-10-CM

## 2013-06-28 LAB — IBC PANEL
Iron: 103 ug/dL (ref 42–145)
Saturation Ratios: 23.5 % (ref 20.0–50.0)
TRANSFERRIN: 313.1 mg/dL (ref 212.0–360.0)

## 2013-06-28 LAB — CBC WITH DIFFERENTIAL/PLATELET
Basophils Absolute: 0 10*3/uL (ref 0.0–0.1)
Basophils Relative: 0.4 % (ref 0.0–3.0)
EOS ABS: 0 10*3/uL (ref 0.0–0.7)
EOS PCT: 0.7 % (ref 0.0–5.0)
HCT: 37.2 % (ref 36.0–46.0)
Hemoglobin: 12.6 g/dL (ref 12.0–15.0)
LYMPHS PCT: 27.8 % (ref 12.0–46.0)
Lymphs Abs: 1.4 10*3/uL (ref 0.7–4.0)
MCHC: 34 g/dL (ref 30.0–36.0)
MCV: 89.8 fl (ref 78.0–100.0)
MONO ABS: 0.5 10*3/uL (ref 0.1–1.0)
Monocytes Relative: 9.9 % (ref 3.0–12.0)
NEUTROS PCT: 61.2 % (ref 43.0–77.0)
Neutro Abs: 3 10*3/uL (ref 1.4–7.7)
Platelets: 271 10*3/uL (ref 150.0–400.0)
RBC: 4.14 Mil/uL (ref 3.87–5.11)
RDW: 12.3 % (ref 11.5–15.5)
WBC: 4.9 10*3/uL (ref 4.0–10.5)

## 2013-06-28 LAB — HEPATIC FUNCTION PANEL
ALBUMIN: 4.6 g/dL (ref 3.5–5.2)
ALK PHOS: 53 U/L (ref 39–117)
ALT: 20 U/L (ref 0–35)
AST: 24 U/L (ref 0–37)
Bilirubin, Direct: 0.1 mg/dL (ref 0.0–0.3)
Total Bilirubin: 0.7 mg/dL (ref 0.2–1.2)
Total Protein: 7.1 g/dL (ref 6.0–8.3)

## 2013-06-28 LAB — BASIC METABOLIC PANEL
BUN: 11 mg/dL (ref 6–23)
CALCIUM: 9.9 mg/dL (ref 8.4–10.5)
CHLORIDE: 96 meq/L (ref 96–112)
CO2: 31 mEq/L (ref 19–32)
CREATININE: 0.7 mg/dL (ref 0.4–1.2)
GFR: 94.11 mL/min (ref >60.00)
Glucose, Bld: 102 mg/dL — ABNORMAL HIGH (ref 70–99)
Potassium: 4.4 mEq/L (ref 3.5–5.1)
Sodium: 136 mEq/L (ref 135–145)

## 2013-06-28 LAB — TSH: TSH: 1.62 u[IU]/mL (ref 0.35–4.50)

## 2013-06-28 LAB — VITAMIN B12: Vitamin B-12: 459 pg/mL (ref 211–911)

## 2013-06-28 NOTE — Assessment & Plan Note (Signed)
Blood pressure goals reviewed. BMET 

## 2013-06-28 NOTE — Assessment & Plan Note (Signed)
CBC & dif IBC

## 2013-06-28 NOTE — Patient Instructions (Signed)
Your next office appointment will be determined based upon review of your pending labs. Those instructions will be transmitted to you through My Chart . 

## 2013-06-28 NOTE — Progress Notes (Signed)
   Subjective:    Patient ID: Vicki Garcia, female    DOB: [birth year], 78 y.o.   MRN: [id number]  HPI She is here to assess active health issues & conditions. PMH, FH, & Social history verified & updated  A heart healthy diet is followed; exercise encompasses >30 minutes >5  times per week as walking ,strength, & Pilates without symptoms.  Family history is + for premature coronary disease. Advanced cholesterol testing not done to date. There is medication compliance with the statin.  Low dose ASA not taken   BP @ home < 140/90 @ home.  Review of Systems  Specifically denied are  chest pain, palpitations, dyspnea, or claudication.  Significant abdominal symptoms, memory deficit, or myalgias not present.       Objective:   Physical Exam Gen.: Thin but healthy and well-nourished in appearance. Alert, appropriate and cooperative throughout exam. Appears younger than stated age  Head: Normocephalic without obvious abnormalities Eyes: No corneal or conjunctival inflammation noted. Pupils equal round reactive to light and accommodation. Extraocular motion intact.  Ears: External  ear exam reveals no significant lesions or deformities. Canals clear .TMs normal. Hearing is grossly decreasedbilaterally. Nose: External nasal exam reveals no deformity or inflammation. Nasal mucosa are pink and moist. No lesions or exudates noted.   Mouth: Oral mucosa and oropharynx reveal no lesions or exudates. Teeth in good repair. Neck: No deformities, masses, or tenderness noted. Range of motion decreased. Thyroid normal. Lungs: Normal respiratory effort; chest expands symmetrically. Lungs are clear to auscultation without rales, wheezes, or increased work of breathing. Heart: Normal rate and rhythm. Normal S1 and S2. No gallop, click, or rub.No murmur. Abdomen: Bowel sounds normal; abdomen soft and nontender. No masses, organomegaly or hernias noted. Genitalia: as per Gyn                                    Musculoskeletal/extremities: No deformity or scoliosis noted of  the thoracic or lumbar spine.  No clubbing, cyanosis, edema, or significant extremity  deformity noted. Range of motion normal .Tone & strength normal. Hand joints normal.  Fingernail  health good. Able to lie down & sit up w/o help. Negative SLR bilaterally Vascular: Carotid, radial artery, dorsalis pedis and  posterior tibial pulses are full and equal. No bruits present. Neurologic: Alert and oriented x3. Deep tendon reflexes symmetrical and normal.  Gait normal       Skin: Intact without suspicious lesions or rashes. Lymph: No cervical, axillary lymphadenopathy present. Psych: Mood and affect are normal. Normally interactive                                                                                        Assessment & Plan:  See Current Assessment & Plan in Problem List under specific DiagnosisThe labs will be reviewed and risks and options assessed. Written recommendations will be provided by mail or directly through My Chart.Further evaluation or change in medical therapy will be directed by those results.

## 2013-06-28 NOTE — Assessment & Plan Note (Addendum)
NMR Lipoprofile, LFT, CK, TSH

## 2013-06-28 NOTE — Progress Notes (Signed)
Pre visit review using our clinic review tool, if applicable. No additional management support is needed unless otherwise documented below in the visit note. 

## 2013-06-29 ENCOUNTER — Other Ambulatory Visit: Payer: Self-pay | Admitting: Internal Medicine

## 2013-06-29 DIAGNOSIS — H9313 Tinnitus, bilateral: Secondary | ICD-10-CM

## 2013-06-29 MED ORDER — CLONAZEPAM 0.5 MG PO TABS: ORAL_TABLET | ORAL | Status: DC

## 2013-06-29 NOTE — Assessment & Plan Note (Signed)
Impacts sleep; Clonazepam Rxed qhs prn

## 2013-06-30 LAB — NMR LIPOPROFILE WITH LIPIDS
CHOLESTEROL, TOTAL: 154 mg/dL (ref <200)
HDL PARTICLE NUMBER: 41.8 umol/L (ref >=30.5)
HDL Size: 9.8 nm (ref >=9.2)
HDL-C: 71 mg/dL (ref >=40)
LARGE VLDL-P: 1.9 nmol/L (ref <=2.7)
LDL (calc): 68 mg/dL (ref <100)
LDL Particle Number: 779 nmol/L (ref <1000)
LDL Size: 20.5 nm — ABNORMAL LOW (ref >20.5)
LP-IR Score: 30 (ref <=45)
Large HDL-P: 11.7 umol/L (ref >=4.8)
SMALL LDL PARTICLE NUMBER: 253 nmol/L (ref <=527)
Triglycerides: 75 mg/dL (ref <150)
VLDL Size: 46.1 nm (ref <=46.6)

## 2013-07-02 ENCOUNTER — Other Ambulatory Visit: Payer: Self-pay | Admitting: Internal Medicine

## 2013-07-02 ENCOUNTER — Encounter: Payer: Self-pay | Admitting: Internal Medicine

## 2013-07-02 DIAGNOSIS — E785 Hyperlipidemia, unspecified: Secondary | ICD-10-CM

## 2013-07-02 DIAGNOSIS — R7309 Other abnormal glucose: Secondary | ICD-10-CM | POA: Insufficient documentation

## 2013-07-03 ENCOUNTER — Ambulatory Visit: Payer: Medicare Other

## 2013-07-03 ENCOUNTER — Encounter: Payer: Self-pay | Admitting: Internal Medicine

## 2013-07-03 ENCOUNTER — Telehealth: Payer: Self-pay

## 2013-07-03 DIAGNOSIS — R7309 Other abnormal glucose: Secondary | ICD-10-CM

## 2013-07-03 LAB — HEMOGLOBIN A1C: HEMOGLOBIN A1C: 6 % (ref 4.6–6.5)

## 2013-07-03 NOTE — Telephone Encounter (Signed)
Message copied by Shelly Coss on Mon Jul 03, 2013  8:40 AM ------      Message from: Hendricks Limes      Created: Sun Jul 02, 2013  7:40 AM       Please add A1c (790.29)       ------

## 2013-07-03 NOTE — Telephone Encounter (Signed)
Request for A1C add on has been faxed

## 2013-07-24 ENCOUNTER — Encounter: Payer: Self-pay | Admitting: Internal Medicine

## 2013-08-04 ENCOUNTER — Encounter: Payer: Self-pay | Admitting: Internal Medicine

## 2013-08-07 ENCOUNTER — Encounter: Payer: Self-pay | Admitting: Internal Medicine

## 2013-08-08 ENCOUNTER — Encounter: Payer: Self-pay | Admitting: Internal Medicine

## 2013-08-09 ENCOUNTER — Other Ambulatory Visit: Payer: Self-pay | Admitting: Internal Medicine

## 2013-08-09 ENCOUNTER — Encounter: Payer: Self-pay | Admitting: Internal Medicine

## 2013-08-09 DIAGNOSIS — H9313 Tinnitus, bilateral: Secondary | ICD-10-CM

## 2013-08-11 ENCOUNTER — Other Ambulatory Visit: Payer: Self-pay | Admitting: Internal Medicine

## 2013-08-11 ENCOUNTER — Encounter: Payer: Self-pay | Admitting: Internal Medicine

## 2013-08-11 MED ORDER — CLONAZEPAM 0.5 MG PO TABS: ORAL_TABLET | ORAL | Status: DC

## 2013-09-03 ENCOUNTER — Encounter: Payer: Self-pay | Admitting: Internal Medicine

## 2013-09-10 ENCOUNTER — Other Ambulatory Visit: Payer: Self-pay | Admitting: Internal Medicine

## 2013-09-20 ENCOUNTER — Encounter: Payer: Self-pay | Admitting: Internal Medicine

## 2013-09-20 ENCOUNTER — Other Ambulatory Visit: Payer: Self-pay | Admitting: Internal Medicine

## 2013-09-20 NOTE — Telephone Encounter (Signed)
06/28/13 last filled

## 2013-09-20 NOTE — Telephone Encounter (Signed)
OK X1 

## 2013-09-20 NOTE — Telephone Encounter (Signed)
OK for both

## 2013-10-04 ENCOUNTER — Telehealth: Payer: Self-pay | Admitting: Internal Medicine

## 2013-10-04 NOTE — Telephone Encounter (Signed)
Called spoke with pt. She c/o sinus HA, nose burns, sore throat, PND, slight prod cough-white phlem x couple days. No f/c/s/n/v. Pt took aleve. Pt reports it is all sinus related. She is aware CDY out this afternoon and would prefer to wait until he comes in tomorrow. Please advise CDY thanks  Allergies  Allergen Reactions  . Codeine     REACTION: violently ill with N&V  . Norvasc [Amlodipine Besylate]     Nausea & vomiting  . Vasotec     cough     Current Outpatient Prescriptions on File Prior to Visit  Medication Sig Dispense Refill  . atorvastatin (LIPITOR) 20 MG tablet TAKE (1/2) TABLET DAILY.  45 tablet  1  . clonazePAM (KLONOPIN) 0.5 MG tablet TAKE 1/2 TO 1 TABLET AT BEDTIME AS NEEDED.  30 tablet  0  . latanoprost (XALATAN) 0.005 % ophthalmic solution as directed.       . valsartan-hydrochlorothiazide (DIOVAN-HCT) 160-12.5 MG per tablet TAKE 1 TABLET ONCE DAILY.  90 tablet  1   No current facility-administered medications on file prior to visit.

## 2013-10-05 MED ORDER — AZITHROMYCIN 250 MG PO TABS: 250.0000 mg | ORAL_TABLET | ORAL | Status: DC

## 2013-10-05 NOTE — Telephone Encounter (Signed)
Offer Zpak           Suggest saline nasal spray

## 2013-10-05 NOTE — Telephone Encounter (Signed)
Spoke with the pt and notified of recs per CDY  She verbalized understanding  Nothing further needed  Rx was sent to pharm

## 2013-10-06 ENCOUNTER — Telehealth: Payer: Self-pay | Admitting: Internal Medicine

## 2013-10-06 NOTE — Telephone Encounter (Signed)
Patient states that she just had her dog put to sleep.  She is requesting something to be called in for her nerves for a few days.  She uses [facility].

## 2013-10-06 NOTE — Telephone Encounter (Signed)
Sorry; [name] been there too many times  The safest thing would be to take a half a clonazepam every 8 hours as needed in addition to the bedtime dose. Reduce back to bedtime as soon as possible.

## 2013-10-06 NOTE — Telephone Encounter (Signed)
Left message to return call 

## 2013-10-09 NOTE — Telephone Encounter (Signed)
Phone call to patient. She states she is doing a lot better. I advised her if she does need to take the extra 1/2 of Clonazepam every 8 hours per Dr Linna Darner reduce back to just the bedtime dose as soon as possible.

## 2013-11-03 ENCOUNTER — Other Ambulatory Visit: Payer: Self-pay

## 2013-11-03 ENCOUNTER — Encounter: Payer: Self-pay | Admitting: Internal Medicine

## 2013-11-03 MED ORDER — CLONAZEPAM 0.5 MG PO TABS: ORAL_TABLET | ORAL | Status: DC

## 2013-11-03 NOTE — Telephone Encounter (Signed)
Per my chart message clonazepam has been called to [facility]

## 2013-11-08 ENCOUNTER — Telehealth: Payer: Self-pay | Admitting: Internal Medicine

## 2013-11-08 NOTE — Telephone Encounter (Signed)
Called and spoke with pt and she stated that she finished the abx and she is not any better.  She was wanting to be seen. appt has been scheduled for pt on Friday at 10:15 with CY.

## 2013-11-10 ENCOUNTER — Encounter: Payer: Self-pay | Admitting: Internal Medicine

## 2013-11-10 ENCOUNTER — Ambulatory Visit (INDEPENDENT_AMBULATORY_CARE_PROVIDER_SITE_OTHER): Payer: Medicare Other | Admitting: Internal Medicine

## 2013-11-10 ENCOUNTER — Telehealth: Payer: Self-pay | Admitting: Internal Medicine

## 2013-11-10 VITALS — BP 156/78 | HR 73 | Ht 60.0 in | Wt 109.4 lb

## 2013-11-10 DIAGNOSIS — J3089 Other allergic rhinitis: Principal | ICD-10-CM

## 2013-11-10 DIAGNOSIS — J309 Allergic rhinitis, unspecified: Secondary | ICD-10-CM

## 2013-11-10 DIAGNOSIS — J302 Other seasonal allergic rhinitis: Secondary | ICD-10-CM

## 2013-11-10 DIAGNOSIS — Z23 Encounter for immunization: Secondary | ICD-10-CM

## 2013-11-10 NOTE — Telephone Encounter (Signed)
Patient stated that her B/P has been running a little high 150/80. Just wanted the Doctor to be aware.

## 2013-11-10 NOTE — Patient Instructions (Signed)
Flu vax  Try otc nasal rinse "Alkalol" in Neti pot or squeeze bottle  Try Sudafed in the morning to open yourself up  [facility] also to try nasal saline gel as needed

## 2013-11-10 NOTE — Progress Notes (Signed)
04/30/11- 21 yoF referred courtesy of Dr Linna Darner for allergy evaluation. She complains of sinus congestion with postnasal drainage "every other month". This has been going on over a year. She has seen Dr. Blima Dessert who treated with antibiotics. These help for a month at a time but then her nose starts to burn, drainage begins and head congestion gets worse. CT scan of the sinuses on 04/21/2011 showed bilateral chronic maxillary sinusitis. She denies any previously recognized allergy problem. 2 years ago black mold was noted around duct work in her home and remediated - symptoms got worse after that. She felt better while traveling in [place]. There has been no chest discomfort. Little sneeze or itch. No problems with  ears. She denies urticaria or atopic dermatitis. There has been no ENT surgery. Environment- house with remediation of black mold 2 years ago. Dry. 2 dogs in. No smokers. She is married, self-employed, works at home.  06/11/11- 76 yoF followed for allergic rhinitis Drainage that burns throat, sinus troubles,feels bad all over. She feels that her head is in a drum. Postnasal drip. Denies cough or wheeze.   CT scan of sinuses 04/21/2011-chronic maxillary sinusitis. Images reviewed with her. Allergy Profile  Total IgE < 1.5, negative.   08/11/11- 44 yoF followed for allergic rhinitis Pt states having  issues with eyes being red and itchy,along with sinus drainage.. her ophthalmologist gave her an OTC eyedrop which helps some. She is on other eyedrops typically used for glaucoma, but says they are being given for "a vein occlusion" and that she does not have glaucoma. Burning postnasal drip. Has tried Claritin and Singulair. Used Dymista sample then went back to [facility]. CT sinus 04/21/11- IMPRESSION:  Mild chronic sinusitis bilateral maxillary sinus.  Original Report Authenticated By: Truett Perna, M.D.   11/12/11- 76 yoF followed for allergic rhinitis/ conjunctivitis c/o feeling  bad,woke up today w/hoarseness,cough-dry,,no wheezing,no sob,no fcs,sinus pr. Dry cough this week. Mild sore throat, no fever. This fall increased seasonal rhinitis. Using Dymista and singulair.  11/14/12- 40 yoF followed for allergic rhinitis/ conjunctivitis For 2 weeks she has been having facial pressure, nose burns, raspy voice, feels tired overall, PND. Placed on augmentin by PA for her PCP.  Has been raking leaves-nose is burning. Retro-orbital and frontal headache. Throat irritated. Feels postnasal drip but blows out nothing. No effect from Augmentin. No sick exposure. Outdoors frequently. Light cough. Not using Flonase/Astelin which she thinks would make it worse.  05/31/13- 78 yoF followed for allergic rhinitis/ conjunctivitis, complicated by tinnitus No antihistamines, OTC cough syrups, or OTC sleep aids in past 3 days Returns for allergy skin testing. Today she describes typical symptoms as starting with burning and pressure in the nose, watering eyes, irritation in the throat with initiation of cough. These episodes may happen about once a month but were more sustained repetitive during the winter. She has not recognized triggers although the winter episode might have been viral. She has not seen sustained benefit from Flonase, eyedrops or antihistamines. Today she also complains of tinnitus which has become more disturbing. Allergy Skin Testing 05/31/13- Weakly positive compared to controls, for grass and some tree pollens, ragweed, dust, cat ( no cat), alternaria.  11/10/13- 43 yoF followed for allergic rhinitis/ conjunctivitis, complicated by tinnitus FOLLOW FOR:  allergies; nose/eyes burning, throat sore  Denies itching or sneezing or using saline nasal spray.  ROS-see HPI Constitutional:   No-   weight loss, night sweats, fevers, chills, fatigue, lassitude. HEENT:   No-  headaches,  difficulty swallowing, tooth/dental problems, sore throat,       No-  sneezing, itching, ear ache,    +nasal congestion, +post nasal drip,  CV:  No-   chest pain, orthopnea, PND, swelling in lower extremities, anasarca,  dizziness, palpitations Resp: No-   shortness of breath with exertion or at rest.              No-   productive cough,  + non-productive cough,  No- coughing up of blood.              No-   change in color of mucus.  No- wheezing.   Skin: No-   rash or lesions. GI:  No-   heartburn, indigestion, abdominal pain, nausea, vomiting,  GU:   pain.MS:  No-   joint pain or swelling. . Neuro-     nothing unusual Psych:  No- change in mood or affect. No depression or anxiety.  No memory loss.  OBJ- Physical Exam General- Alert, Oriented, Affect-appropriate, Distress- none acute, slender Skin- rash-none, lesions- none, excoriation- none Lymphadenopathy- none Head- atraumatic            Eyes- Gross vision intact, PERRLA, .            Ears- +hard of hearing            Nose- + pale mucosa, no-Septal dev,  polyps, erosion, perforation             Throat- Mallampati II , mucosa+ red , drainage- none, tonsils- atrophic.                       +Frequent throat clearing Neck- flexible , trachea midline, no stridor , thyroid nl, carotid no bruit Chest - symmetrical excursion , unlabored           Heart/CV- RRR , no murmur , no gallop  , no rub, nl s1 s2                           - JVD- none , edema- none, stasis changes- none, varices- none           Lung- clear, wheeze- none, cough- none , dullness-none, rub- none           Chest wall-  Abd-  Br/ Gen/ Rectal- Not done, not indicated Extrem- cyanosis- none, clubbing, none, atrophy- none, strength- nl Neuro- grossly intact to observation

## 2013-11-10 NOTE — Telephone Encounter (Signed)
Minimal Blood Pressure Goal= AVERAGE < 140/90;  Ideal is an AVERAGE < 135/85. This AVERAGE should be calculated from @ least 5-7 BP readings taken @ different times of day on different days of week. Please bring your  blood pressure cuff to office visits to verify that it is reliable.It  can also be checked against the blood pressure device at the pharmacy.

## 2013-11-10 NOTE — Telephone Encounter (Signed)
Phone call to patient. Left a message on both home and cell #'s to call back

## 2013-11-19 NOTE — Assessment & Plan Note (Signed)
Current complaints sound more like irritant response Plan-try nasal rinse with Alkalol, flu vaccine, Sudafed

## 2013-12-07 ENCOUNTER — Ambulatory Visit (INDEPENDENT_AMBULATORY_CARE_PROVIDER_SITE_OTHER): Payer: Medicare Other | Admitting: Internal Medicine

## 2013-12-07 ENCOUNTER — Encounter: Payer: Self-pay | Admitting: Internal Medicine

## 2013-12-07 VITALS — BP 120/68 | HR 78 | Temp 98.2°F | Wt 110.0 lb

## 2013-12-07 DIAGNOSIS — R05 Cough: Secondary | ICD-10-CM

## 2013-12-07 DIAGNOSIS — R059 Cough, unspecified: Secondary | ICD-10-CM

## 2013-12-07 DIAGNOSIS — J310 Chronic rhinitis: Secondary | ICD-10-CM

## 2013-12-07 MED ORDER — BENZONATATE 200 MG PO CAPS: 200.0000 mg | ORAL_CAPSULE | Freq: Three times a day (TID) | ORAL | Status: DC | PRN

## 2013-12-07 NOTE — Progress Notes (Signed)
Pre visit review using our clinic review tool, if applicable. No additional management support is needed unless otherwise documented below in the visit note. 

## 2013-12-07 NOTE — Progress Notes (Signed)
   Subjective:    Patient ID: Vicki Garcia, female    DOB: 07/11/1935, 78 y.o.   MRN: [id number]  HPI   Symptoms began approximately 7 days ago as head congestion. She subsequently developed chest congestion and nonproductive cough. Other symptoms include some minor itchy eyes and malaise. She has significant postnasal drainage associated with repeated clearing of her throat.  She is used minimal amounts of Coricidin HBP. She also is using a nasal saline.  Symptoms continue as pressure in the frontal areas and in the facial sinus area without other signs or symptoms of upper respiratory tract infection.      Review of Systems  Significant frontal headache, facial pain , nasal purulence, dental pain, sore throat , otic pain or otic discharge denied. No fever , chills or sweats.     Objective:   Physical Exam   Positive or pertinent findings include: Bilateral hearing aids. Breath sounds are slightly decreased.  General appearance:good health ;well nourished; no acute distress or increased work of breathing is present.  No  lymphadenopathy about the head, neck, or axilla noted.  Eyes: No conjunctival inflammation or lid edema is present. There is no scleral icterus. Ears:  External ear exam shows no significant lesions or deformities.  Otoscopic examination reveals clear canals, tympanic membranes are intact bilaterally without bulging, retraction, inflammation or discharge. Nose:  External nasal examination shows no deformity or inflammation. Nasal mucosa are pink and moist without lesions or exudates. No septal dislocation or deviation.No obstruction to airflow.  Oral exam: Dental hygiene is good; lips and gums are healthy appearing.There is no oropharyngeal erythema or exudate noted.  Neck:  No deformities, thyromegaly, masses, or tenderness noted.   Supple with full range of motion without pain.  Heart:  Normal rate and regular rhythm. S1 and S2 normal without gallop, murmur, click,  rub or other extra sounds.  Lungs:Chest clear to auscultation; no wheezes, rhonchi,rales ,or rubs present.No increased work of breathing.   Extremities:  No cyanosis, edema, or clubbing  noted  Skin: Warm & dry w/o jaundice or tenting.         Assessment & Plan:  #1 non allergic rhinitis #2 cough See Orders & AVS

## 2013-12-07 NOTE — Patient Instructions (Addendum)
Plain Mucinex (NOT D) for thick secretions ;force NON dairy fluids .   Nasal cleansing in the shower as discussed with lather of mild shampoo.After 10 seconds wash off lather while  exhaling through nostrils. Make sure that all residual soap is removed to prevent irritation.  Flonase OR Nasacort AQ 1 spray in each nostril twice a day as needed. Use the "crossover" technique into opposite nostril spraying toward opposite ear @ 45 degree angle, not straight up into nostril.  Use a Neti pot daily only  as needed for significant sinus congestion; going from open side to congested side . Plain Allegra (NOT D )  160 daily , Loratidine 10 mg , OR Zyrtec 10 mg @ bedtime  as needed for itchy eyes & sneezing.   Zicam Melts or Zinc lozenges as per package label for sore throat . Complementary options include  vitamin C 2000 mg daily; & Echinacea for 4-7 days. Report persistent or progressive fever or discolored nasal or chest secretions

## 2013-12-26 ENCOUNTER — Telehealth: Payer: Self-pay | Admitting: *Deleted

## 2013-12-26 ENCOUNTER — Other Ambulatory Visit: Payer: Self-pay | Admitting: Internal Medicine

## 2013-12-26 DIAGNOSIS — J3489 Other specified disorders of nose and nasal sinuses: Secondary | ICD-10-CM

## 2013-12-26 NOTE — Telephone Encounter (Signed)
Pt states she is not getting any better. She has been using the nettie pot, but nothing seem to come out. She still having sinus pain. Wanting advisement on what to do for sxs...Vicki Garcia

## 2013-12-26 NOTE — Telephone Encounter (Signed)
Needs sinus CT (order entered ) then OV

## 2013-12-26 NOTE — Telephone Encounter (Signed)
Notified pt with md response.../lmb 

## 2014-01-01 ENCOUNTER — Other Ambulatory Visit: Payer: Self-pay | Admitting: Family

## 2014-01-01 DIAGNOSIS — J3489 Other specified disorders of nose and nasal sinuses: Secondary | ICD-10-CM

## 2014-01-02 ENCOUNTER — Ambulatory Visit: Admission: RE | Admit: 2014-01-02 | Payer: Medicare Other | Source: Ambulatory Visit

## 2014-01-03 ENCOUNTER — Telehealth: Payer: Self-pay | Admitting: *Deleted

## 2014-01-03 ENCOUNTER — Ambulatory Visit (INDEPENDENT_AMBULATORY_CARE_PROVIDER_SITE_OTHER)
Admission: RE | Admit: 2014-01-03 | Discharge: 2014-01-03 | Disposition: A | Discharge status: Home / Self Care | Payer: Medicare Other | Source: Ambulatory Visit | Attending: Internal Medicine | Admitting: Internal Medicine

## 2014-01-03 ENCOUNTER — Inpatient Hospital Stay: Admission: RE | Admit: 2014-01-03 | Payer: Medicare Other | Source: Ambulatory Visit

## 2014-01-03 DIAGNOSIS — J3489 Other specified disorders of nose and nasal sinuses: Secondary | ICD-10-CM

## 2014-01-03 NOTE — Telephone Encounter (Signed)
-----   Message from Hendricks Limes, MD sent at 01/03/2014  4:07 AM EST ----- See your telephone note 12/8 She has not had CT of sinuses; order expires today ----- Message -----    From: SYSTEM    Sent: 01/03/2014  12:05 AM      To: Hendricks Limes, MD

## 2014-01-03 NOTE — Telephone Encounter (Signed)
Pt is schedule today at 3:30 for CT...Vicki Garcia

## 2014-01-03 NOTE — Progress Notes (Signed)
Note sent to call patient as order expires today

## 2014-01-10 ENCOUNTER — Encounter: Payer: Self-pay | Admitting: Internal Medicine

## 2014-01-23 ENCOUNTER — Encounter: Payer: Self-pay | Admitting: Internal Medicine

## 2014-01-26 ENCOUNTER — Encounter: Payer: Self-pay | Admitting: Internal Medicine

## 2014-01-26 ENCOUNTER — Other Ambulatory Visit: Payer: Self-pay | Admitting: Internal Medicine

## 2014-01-26 MED ORDER — CLONAZEPAM 0.5 MG PO TABS: ORAL_TABLET | ORAL | Status: DC

## 2014-01-26 NOTE — Progress Notes (Signed)
Clonazepam has been called to [facility]

## 2014-02-07 ENCOUNTER — Other Ambulatory Visit: Payer: Self-pay | Admitting: Internal Medicine

## 2014-02-07 ENCOUNTER — Encounter: Payer: Self-pay | Admitting: Internal Medicine

## 2014-02-07 ENCOUNTER — Other Ambulatory Visit: Payer: Self-pay | Admitting: Dermatology

## 2014-02-07 MED ORDER — MECLIZINE HCL 25 MG PO TABS: ORAL_TABLET | ORAL | Status: DC

## 2014-02-15 NOTE — Telephone Encounter (Signed)
Error

## 2014-03-06 ENCOUNTER — Encounter: Payer: Self-pay | Admitting: Internal Medicine

## 2014-03-11 ENCOUNTER — Other Ambulatory Visit: Payer: Self-pay | Admitting: Internal Medicine

## 2014-03-13 ENCOUNTER — Telehealth: Payer: Self-pay

## 2014-03-13 NOTE — Telephone Encounter (Signed)
Valsartan HCTZ 160-12.5 mg requires prior authorization. Form has been completed via cover my meds. Waiting on insurance response.

## 2014-03-16 ENCOUNTER — Telehealth: Payer: Self-pay | Admitting: Internal Medicine

## 2014-03-16 MED ORDER — LOSARTAN POTASSIUM 100 MG PO TABS: 100.0000 mg | ORAL_TABLET | Freq: Every day | ORAL | Status: DC

## 2014-03-16 NOTE — Telephone Encounter (Signed)
Patient has been advised Diovan is being switched to Losartan 100 mg. New script has been sent to [facility]

## 2014-03-16 NOTE — Telephone Encounter (Signed)
Dr Linna Darner, What would you like to switch to?

## 2014-03-16 NOTE — Telephone Encounter (Signed)
Patient states insurance will no longer cover diovan.  She would like to know what she will be able to take in place.

## 2014-03-16 NOTE — Telephone Encounter (Signed)
Losartan 100

## 2014-03-17 ENCOUNTER — Encounter: Payer: Self-pay | Admitting: Internal Medicine

## 2014-05-11 ENCOUNTER — Other Ambulatory Visit: Payer: Self-pay | Admitting: Internal Medicine

## 2014-07-04 ENCOUNTER — Telehealth: Payer: Self-pay | Admitting: Internal Medicine

## 2014-07-04 MED ORDER — AMOXICILLIN-POT CLAVULANATE 875-125 MG PO TABS: 1.0000 | ORAL_TABLET | Freq: Two times a day (BID) | ORAL | Status: DC

## 2014-07-04 NOTE — Telephone Encounter (Signed)
Offer augmentin 875, # 14, 1 twice daily 

## 2014-07-04 NOTE — Telephone Encounter (Signed)
Pt c/o sinus congestion/headache X4-5 days.  Denies fever,PND.   Pt has been using saline to help.  Is requesting an abx. Pt uses [facility].    Last ov:11/10/13 Next ov: none  CY please advise on recs.  Thanks!  Allergies  Allergen Reactions  . Codeine     REACTION: violently ill with N&V  . Norvasc [Amlodipine Besylate]     Nausea & vomiting  . Vasotec     cough   Current Outpatient Prescriptions on File Prior to Visit  Medication Sig Dispense Refill  . atorvastatin (LIPITOR) 20 MG tablet TAKE (1/2) TABLET DAILY. 45 tablet 0  . benzonatate (TESSALON) 200 MG capsule Take 1 capsule (200 mg total) by mouth 3 (three) times daily as needed for cough. 15 capsule 0  . clonazePAM (KLONOPIN) 0.5 MG tablet TAKE 1/2 TO 1 TABLET AT BEDTIME AS NEEDED. 30 tablet 1  . latanoprost (XALATAN) 0.005 % ophthalmic solution as directed.     Marland Kitchen losartan (COZAAR) 100 MG tablet Take 1 tablet (100 mg total) by mouth daily. 90 tablet 0  . meclizine (ANTIVERT) 25 MG tablet 1/2-1 q 8 hrs prn 15 tablet 0   No current facility-administered medications on file prior to visit.

## 2014-07-04 NOTE — Telephone Encounter (Signed)
Spoke with pt and agreed with rx being sent. Sent Augmentin per CY. Nothing further needed at this time.

## 2014-07-16 ENCOUNTER — Other Ambulatory Visit: Payer: Self-pay

## 2014-07-18 ENCOUNTER — Other Ambulatory Visit: Payer: Self-pay | Admitting: Internal Medicine

## 2014-07-18 NOTE — Telephone Encounter (Signed)
Losartan and statin rx sent to pharm

## 2014-07-19 ENCOUNTER — Telehealth: Payer: Self-pay | Admitting: Internal Medicine

## 2014-07-19 NOTE — Telephone Encounter (Signed)
LVM stating rx was went in 07/18/14.

## 2014-07-19 NOTE — Telephone Encounter (Signed)
Patient need refill of sen eric-cozaar 100 mg

## 2014-07-30 ENCOUNTER — Other Ambulatory Visit (INDEPENDENT_AMBULATORY_CARE_PROVIDER_SITE_OTHER): Payer: Medicare Other

## 2014-07-30 ENCOUNTER — Ambulatory Visit (INDEPENDENT_AMBULATORY_CARE_PROVIDER_SITE_OTHER): Payer: Medicare Other | Admitting: Internal Medicine

## 2014-07-30 ENCOUNTER — Encounter: Payer: Self-pay | Admitting: Internal Medicine

## 2014-07-30 VITALS — BP 150/70 | HR 87 | Temp 97.8°F | Resp 14 | Wt 110.0 lb

## 2014-07-30 DIAGNOSIS — E785 Hyperlipidemia, unspecified: Secondary | ICD-10-CM | POA: Diagnosis not present

## 2014-07-30 DIAGNOSIS — R1314 Dysphagia, pharyngoesophageal phase: Secondary | ICD-10-CM

## 2014-07-30 DIAGNOSIS — Z860100 Personal history of colon polyps, unspecified: Secondary | ICD-10-CM

## 2014-07-30 DIAGNOSIS — K222 Esophageal obstruction: Secondary | ICD-10-CM | POA: Insufficient documentation

## 2014-07-30 DIAGNOSIS — R0789 Other chest pain: Secondary | ICD-10-CM | POA: Diagnosis not present

## 2014-07-30 DIAGNOSIS — I10 Essential (primary) hypertension: Secondary | ICD-10-CM

## 2014-07-30 LAB — BASIC METABOLIC PANEL
BUN: 15 mg/dL (ref 6–23)
CHLORIDE: 101 meq/L (ref 96–112)
CO2: 32 meq/L (ref 19–32)
Calcium: 9.8 mg/dL (ref 8.4–10.5)
Creatinine, Ser: 0.78 mg/dL (ref 0.40–1.20)
GFR: 76.04 mL/min (ref >60.00)
GLUCOSE: 163 mg/dL — AB (ref 70–99)
Potassium: 4 mEq/L (ref 3.5–5.1)
SODIUM: 140 meq/L (ref 135–145)

## 2014-07-30 LAB — LIPID PANEL
CHOL/HDL RATIO: 2
CHOLESTEROL: 157 mg/dL (ref 0–200)
HDL: 70.1 mg/dL (ref >39.00)
LDL Cholesterol: 75 mg/dL (ref 0–99)
NonHDL: 86.9
Triglycerides: 61 mg/dL (ref 0.0–149.0)
VLDL: 12.2 mg/dL (ref 0.0–40.0)

## 2014-07-30 LAB — HEPATIC FUNCTION PANEL
ALT: 20 U/L (ref 0–35)
AST: 22 U/L (ref 0–37)
Albumin: 4.4 g/dL (ref 3.5–5.2)
Alkaline Phosphatase: 71 U/L (ref 39–117)
Bilirubin, Direct: 0.1 mg/dL (ref 0.0–0.3)
TOTAL PROTEIN: 7.1 g/dL (ref 6.0–8.3)
Total Bilirubin: 0.5 mg/dL (ref 0.2–1.2)

## 2014-07-30 LAB — CBC WITH DIFFERENTIAL/PLATELET
BASOS ABS: 0 10*3/uL (ref 0.0–0.1)
Basophils Relative: 0.3 % (ref 0.0–3.0)
EOS ABS: 0.1 10*3/uL (ref 0.0–0.7)
EOS PCT: 1.1 % (ref 0.0–5.0)
HEMATOCRIT: 38.3 % (ref 36.0–46.0)
Hemoglobin: 13 g/dL (ref 12.0–15.0)
LYMPHS ABS: 1.5 10*3/uL (ref 0.7–4.0)
Lymphocytes Relative: 25.8 % (ref 12.0–46.0)
MCHC: 34 g/dL (ref 30.0–36.0)
MCV: 88.5 fl (ref 78.0–100.0)
Monocytes Absolute: 0.6 10*3/uL (ref 0.1–1.0)
Monocytes Relative: 9.6 % (ref 3.0–12.0)
NEUTROS ABS: 3.8 10*3/uL (ref 1.4–7.7)
NEUTROS PCT: 63.2 % (ref 43.0–77.0)
PLATELETS: 254 10*3/uL (ref 150.0–400.0)
RBC: 4.32 Mil/uL (ref 3.87–5.11)
RDW: 12.8 % (ref 11.5–15.5)
WBC: 6 10*3/uL (ref 4.0–10.5)

## 2014-07-30 LAB — TROPONIN I: TNIDX: 0 ug/l (ref 0.00–0.06)

## 2014-07-30 LAB — TSH: TSH: 2.07 u[IU]/mL (ref 0.35–4.50)

## 2014-07-30 MED ORDER — LOSARTAN POTASSIUM-HCTZ 100-12.5 MG PO TABS: 1.0000 | ORAL_TABLET | Freq: Every day | ORAL | Status: DC

## 2014-07-30 NOTE — Assessment & Plan Note (Signed)
CBC

## 2014-07-30 NOTE — Assessment & Plan Note (Signed)
Lipids, LFTs, TSH ,CK 

## 2014-07-30 NOTE — Progress Notes (Signed)
Pre visit review using our clinic review tool, if applicable. No additional management support is needed unless otherwise documented below in the visit note. 

## 2014-07-30 NOTE — Patient Instructions (Addendum)
Reflux of gastric acid may be asymptomatic as this may occur mainly during sleep.The triggers for reflux  include stress; the "aspirin family" ; alcohol; peppermint; and caffeine (coffee, tea, cola, and chocolate). The aspirin family would include aspirin and the nonsteroidal agents such as ibuprofen &  Naproxen. Tylenol would not cause reflux. If having symptoms ; food & drink should be avoided for @ least 2 hours before going to bed.    Your next office appointment will be determined based upon review of your pending labs .  Those written interpretation of the lab results and instructions will be transmitted to you by My Chart  Critical results will be called.   Followup as needed for any active or acute issue. Please report any significant change in your symptoms.  Minimal Blood Pressure Goal= AVERAGE < 140/90;  Ideal is an AVERAGE < 135/85. This AVERAGE should be calculated from @ least 5-7 BP readings taken @ different times of day on different days of week. You should not respond to isolated BP readings , but rather the AVERAGE for that week .Please bring your  blood pressure cuff to office visits to verify that it is reliable.It  can also be checked against the blood pressure device at the pharmacy. Finger or wrist cuffs are not dependable; an arm cuff is.

## 2014-07-30 NOTE — Progress Notes (Signed)
   Subjective:    Patient ID: Vicki Garcia, female    DOB: 11-03-1935, 79 y.o.   MRN: [id number]  HPI The patient is here to assess status of active health conditions.  PMH, FH, & Social History reviewed & updated.  She has been on a heart healthy diet. Based on advanced cholesterol testing in June 2015; the atorvastatin was to be decreased to one half pill every other day. She has continued one half pill daily. She exercises as Pilates 2 times a week and walks 5 days a week typically w/o cardiopulmonary symptoms.  She smoked up to 4 cigarette a day 1959-1971. She will have 3-7 alcoholic beverages a week.  Today she has had sharp epigastric pain lasting seconds at rest. It is not associated with nausea and diaphoresis.  She has a history of dysphagia with chicken breast ingestion. In July 2014 she was found to have a benign distal esophageal stricture. She denies other GI symptoms at this time.   Her father had heart attack at 31. Paternal uncle had a heart attack at 69.    Review of Systems  Palpitations, tachycardia, exertional dyspnea, paroxysmal nocturnal dyspnea, claudication or edema are absent. No unexplained weight loss, chronic or recurrent abdominal pain, significant dyspepsia, melena, rectal bleeding, or persistently small caliber stools. Dysuria, pyuria, hematuria, frequency, nocturia or polyuria are denied. Change in hair, skin, nails denied. No bowel changes of constipation or diarrhea. No intolerance to heat or cold.      Objective:   Physical Exam   General appearance is one of good health and nourishment w/o distress.  Eyes: No conjunctival inflammation or scleral icterus is present.  Oral exam: Dental hygiene is good; lips and gums are healthy appearing.There is no oropharyngeal erythema or exudate noted.   Heart:  Normal rate and regular rhythm. S1 and S2 normal without gallop, murmur, click, rub or other extra sounds     Lungs:Chest clear to auscultation; no  wheezes, rhonchi,rales ,or rubs present.No increased work of breathing.   Abdomen: bowel sounds normal, soft and non-tender without masses, organomegaly or hernias noted.  No guarding or rebound .   Musculoskeletal: Able to lie flat and sit up without help. Negative straight leg raising bilaterally. Gait normal  Skin:Warm & dry.  Intact without suspicious lesions or rashes ; no jaundice or tenting  Lymphatic: No lymphadenopathy is noted about the head, neck, axilla            Assessment & Plan:  #1 atypical chest pain. EKG :poor R wave progression  V1-2. Unchanged vs 05/25/12.Troponin will be checked.  #2 see problem list for assessment and plans  See orders

## 2014-07-31 ENCOUNTER — Encounter: Payer: Self-pay | Admitting: Internal Medicine

## 2014-07-31 ENCOUNTER — Other Ambulatory Visit (INDEPENDENT_AMBULATORY_CARE_PROVIDER_SITE_OTHER): Payer: Medicare Other

## 2014-07-31 DIAGNOSIS — R739 Hyperglycemia, unspecified: Secondary | ICD-10-CM

## 2014-07-31 LAB — HEMOGLOBIN A1C: HEMOGLOBIN A1C: 5.7 % (ref 4.6–6.5)

## 2014-07-31 NOTE — Assessment & Plan Note (Signed)
CBC

## 2014-07-31 NOTE — Assessment & Plan Note (Signed)
Blood pressure goals reviewed. BMET 

## 2014-08-01 ENCOUNTER — Other Ambulatory Visit: Payer: Self-pay | Admitting: Emergency Medicine

## 2014-08-01 ENCOUNTER — Telehealth: Payer: Self-pay | Admitting: Internal Medicine

## 2014-08-01 DIAGNOSIS — I10 Essential (primary) hypertension: Secondary | ICD-10-CM

## 2014-08-01 DIAGNOSIS — R05 Cough: Secondary | ICD-10-CM

## 2014-08-01 DIAGNOSIS — R059 Cough, unspecified: Secondary | ICD-10-CM

## 2014-08-01 MED ORDER — ATORVASTATIN CALCIUM 20 MG PO TABS: 20.0000 mg | ORAL_TABLET | Freq: Every day | ORAL | Status: DC

## 2014-08-01 MED ORDER — BENZONATATE 200 MG PO CAPS: 200.0000 mg | ORAL_CAPSULE | Freq: Three times a day (TID) | ORAL | Status: DC | PRN

## 2014-08-01 MED ORDER — LOSARTAN POTASSIUM-HCTZ 100-12.5 MG PO TABS: 1.0000 | ORAL_TABLET | Freq: Every day | ORAL | Status: DC

## 2014-08-01 MED ORDER — MECLIZINE HCL 25 MG PO TABS: ORAL_TABLET | ORAL | Status: DC

## 2014-08-01 NOTE — Telephone Encounter (Signed)
Patient was in yesterday and the pharmacy hasn't received any of her medications that should of been sent [facility]

## 2014-08-01 NOTE — Telephone Encounter (Signed)
Medications have been sent to pharm 

## 2014-08-13 ENCOUNTER — Encounter: Payer: Self-pay | Admitting: Internal Medicine

## 2014-08-15 ENCOUNTER — Ambulatory Visit (INDEPENDENT_AMBULATORY_CARE_PROVIDER_SITE_OTHER): Payer: Medicare Other | Admitting: Internal Medicine

## 2014-08-15 ENCOUNTER — Encounter: Payer: Self-pay | Admitting: Internal Medicine

## 2014-08-15 VITALS — BP 182/82 | HR 65 | Temp 98.0°F | Resp 16 | Wt 111.0 lb

## 2014-08-15 DIAGNOSIS — K625 Hemorrhage of anus and rectum: Secondary | ICD-10-CM

## 2014-08-15 DIAGNOSIS — K648 Other hemorrhoids: Secondary | ICD-10-CM | POA: Diagnosis not present

## 2014-08-15 DIAGNOSIS — K644 Residual hemorrhoidal skin tags: Secondary | ICD-10-CM

## 2014-08-15 MED ORDER — HYDROCORTISONE 2.5 % RE CREA: 1.0000 "application " | TOPICAL_CREAM | Freq: Two times a day (BID) | RECTAL | Status: DC

## 2014-08-15 NOTE — Patient Instructions (Signed)
  Cleanse rectal area with lather of mild shampoo in shower  as discussed. After bowel movement,use tissue to cleanse the bulk of stool & finish up with TUCKS  or Baby Wipes.  Sitz baths followed by the  Medication 2 to 3 times a day to shrink the hemorrhoids. Stay well hydrated and avoid popcorn and some other materials which might aggravate hemorrhoids.

## 2014-08-15 NOTE — Progress Notes (Signed)
   Subjective:    Patient ID: Vicki Garcia, female    DOB: 1935-01-24, 79 y.o.   MRN: [id number]  HPI She's had intermittent minor rectal bleeding the last week. She's noted some blood in the water. There is no abdominal pain ,only slight rectal discomfort.  She has no other bleeding dyscrasias.  She is on no medications which cause constipation.  She had a colonoscopy 07/28/12 which revealed cecal AVMs; diverticulosis; and a single polyp. Internal hemorrhoids were noted on retroflexed views. Pathology revealed an inflammatory type polyp without adenomatous change.  Review of Systems No fever, chills, sweats, weight loss described. Epistaxis, hemoptysis, hematuria, or melena denied. No unexplained weight loss, significant dyspepsia,dysphagia, or abdominal pain.  There is no abnormal bruising , bleeding, or difficulty stopping bleeding with injury.      Objective:   Physical Exam   General appearance is one of good health and nourishment w/o distress.  Eyes: No conjunctival inflammation or scleral icterus is present.  Oral exam: Dental hygiene is good; lips and gums are healthy appearing.There is no oropharyngeal erythema or exudate noted.   Heart:  Normal rate and regular rhythm. S1 and S2 normal without gallop, murmur, click, rub or other extra sounds     Lungs:Chest clear to auscultation; no wheezes, rhonchi,rales ,or rubs present.No increased work of breathing.   Abdomen: bowel sounds normal, soft and non-tender without masses, organomegaly or hernias noted.  No guarding or rebound . Large external hemorrhoid at 2:00 without bleeding. Stool light brown and Hemoccult-negative  Musculoskeletal: Able to lie flat and sit up without help. Negative straight leg raising bilaterally. Gait normal  Skin:Warm & dry.  Intact without suspicious lesions or rashes ; no jaundice or tenting  Lymphatic: No lymphadenopathy is noted about the head, neck, axilla           Assessment &  Plan:  #1 large external nonbleeding hemorrhoid. Hemoccult test on the stool was negative

## 2014-08-15 NOTE — Progress Notes (Signed)
Pre visit review using our clinic review tool, if applicable. No additional management support is needed unless otherwise documented below in the visit note. 

## 2014-09-13 ENCOUNTER — Encounter: Payer: Self-pay | Admitting: Internal Medicine

## 2014-09-13 NOTE — Telephone Encounter (Signed)
Please advise 

## 2014-09-17 ENCOUNTER — Telehealth: Payer: Self-pay | Admitting: Internal Medicine

## 2014-09-17 MED ORDER — AZITHROMYCIN 250 MG PO TABS: ORAL_TABLET | ORAL | Status: DC

## 2014-09-17 NOTE — Telephone Encounter (Signed)
Pt returned call [phone]

## 2014-09-17 NOTE — Telephone Encounter (Signed)
Called spoke with pt. Aware RX sent in. She will call back to schedule appt. Nothing further needed

## 2014-09-17 NOTE — Telephone Encounter (Signed)
lmomtcb for pt 

## 2014-09-17 NOTE — Telephone Encounter (Signed)
Offer Z pak, Suggest Mucinex-D from pharmacist, and make routine follow-up ov.

## 2014-09-17 NOTE — Telephone Encounter (Signed)
Called and spoke to pt. Pt c/o frontal sinus pressure and congestion, HA, rhinorrhea, mild PND, non prod cough with chest congestion x 3-4 days. Pt states she has been taking aleve prn with little relief. Pt denies f/c/s, swelling, hemoptysis, CP/tightness, and SOB. Pt last seen in 10/2013 and advised to f/u in 1 year.   Dr. Annamaria Boots please advise. Thanks.   Allergies  Allergen Reactions  . Codeine     REACTION: violently ill with N&V  . Norvasc [Amlodipine Besylate]     Nausea & vomiting  . Vasotec     cough   Current Outpatient Prescriptions on File Prior to Visit  Medication Sig Dispense Refill  . atorvastatin (LIPITOR) 20 MG tablet Take 1 tablet (20 mg total) by mouth daily at 6 PM. 90 tablet 2  . benzonatate (TESSALON) 200 MG capsule Take 1 capsule (200 mg total) by mouth 3 (three) times daily as needed for cough. 15 capsule 2  . clonazePAM (KLONOPIN) 0.5 MG tablet TAKE 1/2 TO 1 TABLET AT BEDTIME AS NEEDED. 30 tablet 1  . hydrocortisone (PROCTOSOL HC) 2.5 % rectal cream Place 1 application rectally 2 (two) times daily. 30 g 1  . latanoprost (XALATAN) 0.005 % ophthalmic solution as directed.     Marland Kitchen losartan-hydrochlorothiazide (HYZAAR) 100-12.5 MG per tablet Take 1 tablet by mouth daily. 90 tablet 2  . meclizine (ANTIVERT) 25 MG tablet 1/2-1 q 8 hrs prn 15 tablet 1   No current facility-administered medications on file prior to visit.

## 2014-10-18 ENCOUNTER — Telehealth: Payer: Self-pay | Admitting: Internal Medicine

## 2014-10-18 NOTE — Telephone Encounter (Signed)
Pt's spouse called request to speak to the assistant concern about [name] memory problem. Pt's spouse is very concern about her condition and he is not sure what to do. Please call him at [phone] (he does not wan her to know he call over here).

## 2014-10-19 NOTE — Telephone Encounter (Signed)
Spoke with pts husband. He stated that he is concerned with pts memory. Pt has had multiple time where she has forgot about things that were just done or something that has been in place for years and she doesn't remember. He is also concerned that pt gets very aggravated when she is corrected. He would like some advise as to what should be done if anything should be done.

## 2014-10-19 NOTE — Telephone Encounter (Signed)
   Office visit for mental status testing is indicated if he can get her to come in

## 2014-10-22 NOTE — Telephone Encounter (Signed)
Appt has been scheduled.

## 2014-10-24 ENCOUNTER — Other Ambulatory Visit (INDEPENDENT_AMBULATORY_CARE_PROVIDER_SITE_OTHER): Payer: Medicare Other

## 2014-10-24 ENCOUNTER — Encounter: Payer: Self-pay | Admitting: Internal Medicine

## 2014-10-24 ENCOUNTER — Ambulatory Visit (INDEPENDENT_AMBULATORY_CARE_PROVIDER_SITE_OTHER): Payer: Medicare Other | Admitting: Internal Medicine

## 2014-10-24 VITALS — BP 138/80 | HR 70 | Temp 98.4°F | Resp 16 | Ht 60.0 in | Wt 107.0 lb

## 2014-10-24 DIAGNOSIS — F0391 Unspecified dementia with behavioral disturbance: Secondary | ICD-10-CM | POA: Insufficient documentation

## 2014-10-24 DIAGNOSIS — F03918 Unspecified dementia, unspecified severity, with other behavioral disturbance: Secondary | ICD-10-CM | POA: Insufficient documentation

## 2014-10-24 DIAGNOSIS — Z23 Encounter for immunization: Secondary | ICD-10-CM | POA: Diagnosis not present

## 2014-10-24 DIAGNOSIS — R413 Other amnesia: Secondary | ICD-10-CM

## 2014-10-24 DIAGNOSIS — E785 Hyperlipidemia, unspecified: Secondary | ICD-10-CM

## 2014-10-24 LAB — VITAMIN B12: VITAMIN B 12: 289 pg/mL (ref 211–911)

## 2014-10-24 NOTE — Progress Notes (Signed)
   Subjective:    Patient ID: Vicki Garcia, female    DOB: 1935-08-22, 79 y.o.   MRN: [id number]  HPI  Her husband called with concerns about memory deficits. She denies such. She is on a statin.  She has been compliant with the statin. She also denies myalgias.  She states she's on a heart healthy diet. She exercises as Pilates for 45 minutes once a week and walks 2 blocks daily. She denies any associated cardiopulmonary symptoms  She states she checks her blood pressure at home and is unable to give the recordings. She states it is "normal" but cannot tell me what normal is.  TSH was normal in July of this year.  She denies any family history of myocardial infarction, stroke, Parkinson's, or dementia. The record states that her father had coronary artery disease and paternal uncle heart attack at 93.  She does have hearing aids which were checked 6 months ago. She states that she has chronic tinnitus which impacts the hearing despite hearing aids.  She also has history of low back pain.  Review of Systems  Chest pain, palpitations, tachycardia, exertional dyspnea, paroxysmal nocturnal dyspnea, claudication or edema are absent.  Fever, chills, sweats, or unexplained weight loss not present. No significant headaches. Mental status change or memory loss denied. Blurred vision , diplopia or vision loss absent. Vertigo, near syncope or imbalance denied. There is no numbness, tingling, or weakness in extremities.   No loss of control of bladder or bowels. Radicular type pain absent. No seizure stigmata.    Objective:   Physical Exam Pertinent or positive findings include: She does have bilateral hearing aids. Hearing acuity is still decreased. Aorta is palpable without aneurysm formation. Knee reflexes are 1.5+ increased. Posterior tibial pulses are decreased.  Mini  mental status exam revealed a score of 28 out of 30. She gave the day as Tuesday, not Wednesday. She could only  recall 2 of 3 items. She was unable to complete the clock test. She has been communicating through electronic medical record employing My Chart  General appearance :Thin but adequately nourished; in no distress.  Eyes: No conjunctival inflammation or scleral icterus is present.  Heart:  Normal rate and regular rhythm. S1 and S2 normal without gallop, murmur, click, rub or other extra sounds    Lungs:Chest clear to auscultation; no wheezes, rhonchi,rales ,or rubs present.No increased work of breathing.   Abdomen: bowel sounds normal, soft and non-tender without masses, organomegaly or hernias noted.  No guarding or rebound.  Vascular : all pulses equal ; no bruits present.  Skin:Warm & dry.  Intact without suspicious lesions or rashes ; no tenting or jaundice   Lymphatic: No lymphadenopathy is noted about the head, neck, axilla.   Neuro: Strength, tone normal.     Assessment & Plan:  #1 memory deficit in the context of statin therapy  #2 decreased auditory acuity despite hearing aids  Plan: The statin will be held. VDRL and B12 levels will be checked.

## 2014-10-24 NOTE — Progress Notes (Signed)
Pre visit review using our clinic review tool, if applicable. No additional management support is needed unless otherwise documented below in the visit note. 

## 2014-10-24 NOTE — Patient Instructions (Addendum)
As we discussed please do  mental exercises such as crossword puzzles & [place], and keep  current with the news.  Please stop the atorvastatin as there is some concern that this may impact memory.  Please have the hearing aids checked as decreased hearing can mimic memory deficits appearing as  lack of comprehension of verbal communications.   Your next office appointment will be determined based upon review of your pending labs  and  xrays  Those written interpretation of the lab results and instructions will be transmitted to you by My Chart   Critical results will be called.  Followup as needed for any active or acute issue. Please report any significant change in your symptoms.

## 2014-10-25 LAB — RPR

## 2015-01-03 ENCOUNTER — Ambulatory Visit: Payer: Medicare Other | Admitting: Family Medicine

## 2015-02-25 ENCOUNTER — Encounter: Payer: Self-pay | Admitting: Internal Medicine

## 2015-02-25 ENCOUNTER — Ambulatory Visit (INDEPENDENT_AMBULATORY_CARE_PROVIDER_SITE_OTHER): Payer: Medicare Other | Admitting: Internal Medicine

## 2015-02-25 ENCOUNTER — Other Ambulatory Visit (INDEPENDENT_AMBULATORY_CARE_PROVIDER_SITE_OTHER): Payer: Medicare Other

## 2015-02-25 VITALS — BP 140/80 | HR 76 | Temp 98.1°F | Resp 16 | Ht 60.0 in | Wt 106.0 lb

## 2015-02-25 DIAGNOSIS — I10 Essential (primary) hypertension: Secondary | ICD-10-CM

## 2015-02-25 DIAGNOSIS — K648 Other hemorrhoids: Secondary | ICD-10-CM

## 2015-02-25 DIAGNOSIS — K644 Residual hemorrhoidal skin tags: Secondary | ICD-10-CM

## 2015-02-25 DIAGNOSIS — K642 Third degree hemorrhoids: Secondary | ICD-10-CM | POA: Diagnosis not present

## 2015-02-25 DIAGNOSIS — R7309 Other abnormal glucose: Secondary | ICD-10-CM

## 2015-02-25 LAB — COMPREHENSIVE METABOLIC PANEL
ALT: 11 U/L (ref 0–35)
AST: 16 U/L (ref 0–37)
Albumin: 4.6 g/dL (ref 3.5–5.2)
Alkaline Phosphatase: 63 U/L (ref 39–117)
BUN: 19 mg/dL (ref 6–23)
CHLORIDE: 98 meq/L (ref 96–112)
CO2: 32 meq/L (ref 19–32)
Calcium: 10.1 mg/dL (ref 8.4–10.5)
Creatinine, Ser: 0.78 mg/dL (ref 0.40–1.20)
GFR: 75.92 mL/min (ref >60.00)
Glucose, Bld: 107 mg/dL — ABNORMAL HIGH (ref 70–99)
Potassium: 4.2 mEq/L (ref 3.5–5.1)
Sodium: 139 mEq/L (ref 135–145)
Total Bilirubin: 0.7 mg/dL (ref 0.2–1.2)
Total Protein: 7.4 g/dL (ref 6.0–8.3)

## 2015-02-25 LAB — CBC WITH DIFFERENTIAL/PLATELET
BASOS ABS: 0 10*3/uL (ref 0.0–0.1)
Basophils Relative: 0.5 % (ref 0.0–3.0)
EOS ABS: 0.1 10*3/uL (ref 0.0–0.7)
Eosinophils Relative: 0.9 % (ref 0.0–5.0)
HCT: 42.6 % (ref 36.0–46.0)
Hemoglobin: 14.1 g/dL (ref 12.0–15.0)
LYMPHS ABS: 1.4 10*3/uL (ref 0.7–4.0)
LYMPHS PCT: 24.9 % (ref 12.0–46.0)
MCHC: 33.1 g/dL (ref 30.0–36.0)
MCV: 90.6 fl (ref 78.0–100.0)
Monocytes Absolute: 0.6 10*3/uL (ref 0.1–1.0)
Monocytes Relative: 10.9 % (ref 3.0–12.0)
NEUTROS ABS: 3.6 10*3/uL (ref 1.4–7.7)
NEUTROS PCT: 62.8 % (ref 43.0–77.0)
PLATELETS: 305 10*3/uL (ref 150.0–400.0)
RBC: 4.71 Mil/uL (ref 3.87–5.11)
RDW: 12.8 % (ref 11.5–15.5)
WBC: 5.7 10*3/uL (ref 4.0–10.5)

## 2015-02-25 LAB — HEMOGLOBIN A1C: HEMOGLOBIN A1C: 5.8 % (ref 4.6–6.5)

## 2015-02-25 LAB — TSH: TSH: 2.11 u[IU]/mL (ref 0.35–4.50)

## 2015-02-25 LAB — T4, FREE: Free T4: 1 ng/dL (ref 0.60–1.60)

## 2015-02-25 MED ORDER — HYDROCORTISONE 2.5 % RE CREA: 1.0000 "application " | TOPICAL_CREAM | Freq: Two times a day (BID) | RECTAL | Status: DC

## 2015-02-25 NOTE — Progress Notes (Signed)
Pre visit review using our clinic review tool, if applicable. No additional management support is needed unless otherwise documented below in the visit note. 

## 2015-02-25 NOTE — Patient Instructions (Signed)

## 2015-02-25 NOTE — Progress Notes (Signed)
Subjective:  Patient ID: Vicki Garcia, female    DOB: 16-Jul-1935  Age: 80 y.o. MRN: [id number]  CC: Rectal Pain  New to me  HPI Vicki Garcia presents for the complaint of chronic, worsening pain and bleeding from hemorrhoids. She was seen for this about 9 months ago and tried using Proctosol HC rectal cream. She used it for a while and her symptoms resolved and now she has a recurrence of symptoms but not has not been using this recently. She also complains of unexplained weight loss.  Outpatient Prescriptions Prior to Visit  Medication Sig Dispense Refill  . clonazePAM (KLONOPIN) 0.5 MG tablet TAKE 1/2 TO 1 TABLET AT BEDTIME AS NEEDED. 30 tablet 1  . latanoprost (XALATAN) 0.005 % ophthalmic solution as directed.     Marland Kitchen losartan-hydrochlorothiazide (HYZAAR) 100-12.5 MG per tablet Take 1 tablet by mouth daily. 90 tablet 2  . hydrocortisone (PROCTOSOL HC) 2.5 % rectal cream Place 1 application rectally 2 (two) times daily. 30 g 1   No facility-administered medications prior to visit.    ROS Review of Systems  Constitutional: Positive for unexpected weight change (she complains of weight loss). Negative for fever, chills, diaphoresis, appetite change and fatigue.  HENT: Negative.   Eyes: Negative.   Respiratory: Negative.  Negative for cough, choking, chest tightness, shortness of breath and stridor.   Cardiovascular: Negative.  Negative for chest pain, palpitations and leg swelling.  Gastrointestinal: Positive for blood in stool, anal bleeding and rectal pain. Negative for nausea, vomiting, abdominal pain, diarrhea, constipation and abdominal distention.  Endocrine: Negative.   Genitourinary: Negative.  Negative for dysuria, urgency, hematuria, decreased urine volume and difficulty urinating.  Musculoskeletal: Negative.   Skin: Negative.   Allergic/Immunologic: Negative.   Neurological: Negative.   Hematological: Negative.  Negative for adenopathy. Does not bruise/bleed easily.    Psychiatric/Behavioral: Negative.     Objective:  BP 140/80 mmHg  Pulse 76  Temp(Src) 98.1 F (36.7 C) (Oral)  Resp 16  Ht 5' (1.524 m)  Wt 106 lb (48.081 kg)  BMI 20.70 kg/m2  SpO2 95%  BP Readings from Last 3 Encounters:  02/25/15 140/80  10/24/14 138/80  08/15/14 182/82    Wt Readings from Last 3 Encounters:  02/25/15 106 lb (48.081 kg)  10/24/14 107 lb (48.535 kg)  08/15/14 111 lb (50.349 kg)    Physical Exam  Constitutional: She is oriented to person, place, and time. No distress.  HENT:  Mouth/Throat: Oropharynx is clear and moist. No oropharyngeal exudate.  Eyes: Conjunctivae are normal. Right eye exhibits no discharge. Left eye exhibits no discharge. No scleral icterus.  Neck: Normal range of motion. Neck supple. No JVD present. No tracheal deviation present. No thyromegaly present.  Cardiovascular: Normal rate, regular rhythm, normal heart sounds and intact distal pulses.  Exam reveals no gallop and no friction rub.   No murmur heard. Pulmonary/Chest: Effort normal and breath sounds normal. No stridor. No respiratory distress. She has no wheezes. She has no rales. She exhibits no tenderness.  Abdominal: Soft. Bowel sounds are normal. She exhibits no distension and no mass. There is no tenderness. There is no rebound and no guarding.  Genitourinary: Rectal exam shows external hemorrhoid and internal hemorrhoid. Rectal exam shows no fissure, no mass, no tenderness and anal tone normal. Guaiac positive stool.  There are confluent ext anal hemorrhoids with ulceration and prolapse, there is no thrombosis that can be treated today. There is no mass or exudate.  Musculoskeletal: Normal range of motion.  She exhibits no edema or tenderness.  Lymphadenopathy:    She has no cervical adenopathy.  Neurological: She is oriented to person, place, and time.  Skin: Skin is warm and dry. No rash noted. She is not diaphoretic. No erythema. No pallor.  Vitals reviewed.   Lab  Results  Component Value Date   WBC 6.0 07/30/2014   HGB 13.0 07/30/2014   HCT 38.3 07/30/2014   PLT 254.0 07/30/2014   GLUCOSE 163* 07/30/2014   CHOL 157 07/30/2014   TRIG 61.0 07/30/2014   HDL 70.10 07/30/2014   LDLCALC 75 07/30/2014   ALT 20 07/30/2014   AST 22 07/30/2014   NA 140 07/30/2014   K 4.0 07/30/2014   CL 101 07/30/2014   CREATININE 0.78 07/30/2014   BUN 15 07/30/2014   CO2 32 07/30/2014   TSH 2.07 07/30/2014   HGBA1C 5.7 07/31/2014    Ct Maxillofacial Ltd Wo Cm  01/03/2014  CLINICAL DATA:  Persistent sinus pressure and nasal congestion for 1 month. Intermittent sinusitis for last few years. EXAM: CT PARANASAL SINUS LIMITED WITHOUT CONTRAST TECHNIQUE: Non-contiguous multidetector CT images of the paranasal sinuses were obtained in a single plane without contrast. COMPARISON:  04/21/2011 FINDINGS: Minimal mucosal thickening in the maxillary sinuses, improved since prior study. Remainder the paranasal sinuses are clear. No air-fluid levels. Visualized bony structures and orbital soft tissues are unremarkable. IMPRESSION: Minimal chronic maxillary sinusitis, improved since 2013. Electronically Signed   By: Rolm Baptise M.D.   On: 01/03/2014 16:27    Assessment & Plan:   Zabria was seen today for rectal pain.  Diagnoses and all orders for this visit:  Third degree hemorrhoids- I think she would benefit from considering surgical incision, there is also an area of abnormal mass/ulceration that may need to be biopsied in light of her complaint of weight loss. -     Ambulatory referral to General Surgery -     hydrocortisone (PROCTOSOL HC) 2.5 % rectal cream; Place 1 application rectally 2 (two) times daily.  External hemorrhoid -     hydrocortisone (PROCTOSOL HC) 2.5 % rectal cream; Place 1 application rectally 2 (two) times daily.  Essential hypertension- her blood pressure is well-controlled, electrolytes and renal function are stable. She has normal thyroid  function. -     T4, free; Future -     TSH; Future -     CBC with Differential/Platelet; Future -     Comprehensive metabolic panel; Future  Other abnormal glucose- she is prediabetic, no medications are needed at this time. -     Comprehensive metabolic panel; Future -     Hemoglobin A1c; Future   I am having Ms. Pereira maintain her latanoprost, clonazePAM, losartan-hydrochlorothiazide, and hydrocortisone.  Meds ordered this encounter  Medications  . hydrocortisone (PROCTOSOL HC) 2.5 % rectal cream    Sig: Place 1 application rectally 2 (two) times daily.    Dispense:  30 g    Refill:  1     Follow-up: Return if symptoms worsen or fail to improve.  Vicki Calico, MD

## 2015-02-26 ENCOUNTER — Encounter: Payer: Self-pay | Admitting: Internal Medicine

## 2015-03-05 ENCOUNTER — Other Ambulatory Visit: Payer: Self-pay | Admitting: General Surgery

## 2015-03-05 DIAGNOSIS — K643 Fourth degree hemorrhoids: Secondary | ICD-10-CM | POA: Diagnosis not present

## 2015-03-05 NOTE — H&P (Signed)
  History of Present Illness Vicki Ruff MD; [id number] 10:45 AM) Patient words: hems.  The patient is a 80 year old female who presents with hemorrhoids. 80 year old female who presents to the office with worsening anal pain for approximately 9 months. This initially resolved with prescription hemorrhoid medication but she is now having more difficulties with it. She describes mostly pain and no bleeding. She denies any constipation and only endorses occasional straining. She denies any diarrhea. She has never had any hemorrhoid procedures in the past.   Other Problems Marjean Donna, [place]; 03/05/2015 10:23 AM) Hemorrhoids High blood pressure Hypercholesterolemia Melanoma  Past Surgical History Marjean Donna, CMA; 03/05/2015 10:23 AM) Colon Polyp Removal - Colonoscopy Spinal Surgery - Lower Back  Diagnostic Studies History Marjean Donna, CMA; 03/05/2015 10:23 AM) Mammogram 1-3 years ago Pap Smear >5 years ago  Allergies Marjean Donna, CMA; 03/05/2015 10:24 AM) Codeine Phosphate *ANALGESICS - OPIOID* Norvasc *CALCIUM CHANNEL BLOCKERS* Vasotec *ANTIHYPERTENSIVES*  Medication History (Sonya Bynum, CMA; 03/05/2015 10:25 AM) Losartan Potassium-HCTZ (100-12.5MG  Tablet, Oral) Active. Hydrocortisone (2.5% Cream, Rectal) Active. Medications Reconciled  Social History Marjean Donna, CMA; 03/05/2015 10:23 AM) Alcohol use Moderate alcohol use. Caffeine use Coffee. No drug use Tobacco use Former smoker.  Family History Marjean Donna, [place]; 03/05/2015 10:23 AM) Melanoma Father.  Pregnancy / Birth History Marjean Donna, [place]; 03/05/2015 10:23 AM) Age at menarche 39 years. Age of menopause 91-50 Gravida 2 Maternal age 16-25 Para 2    Vitals ([place]; 03/05/2015 10:23 AM) 03/05/2015 10:23 AM Weight: 104 lb Height: 60in Body Surface Area: 1.41 m Body Mass Index: 20.31 kg/m  Pulse: 76 (Regular)  BP: 138/84 (Sitting, Left Arm,  Standard)      Physical Exam Vicki Ruff MD; [id number] 10:44 AM)  General Mental Status-Alert. General Appearance-Not in acute distress. Build & Nutrition-Well nourished. Posture-Normal posture. Gait-Normal.  Head and Neck Head-normocephalic, atraumatic with no lesions or palpable masses. Trachea-midline.  Chest and Lung Exam Chest and lung exam reveals -on auscultation, normal breath sounds, no adventitious sounds and normal vocal resonance.  Cardiovascular Cardiovascular examination reveals -normal heart sounds, regular rate and rhythm with no murmurs.  Abdomen Inspection Inspection of the abdomen reveals - No Hernias. Palpation/Percussion Palpation and Percussion of the abdomen reveal - Soft, Non Tender, No Rigidity (guarding), No hepatosplenomegaly and No Palpable abdominal masses.  Rectal Anorectal Exam External - Note: Edematous external hemorrhoids, grade 4 prolapsing internal hemorrhoid noted in at least 2 locations.  Neurologic Neurologic evaluation reveals -alert and oriented x 3 with no impairment of recent or remote memory, normal attention span and ability to concentrate, normal sensation and normal coordination.  Musculoskeletal Normal Exam - Bilateral-Upper Extremity Strength Normal and Lower Extremity Strength Normal.    Assessment & Plan Vicki Ruff MD; [id number] 10:47 AM)  PROLAPSED INTERNAL HEMORRHOIDS, GRADE 4 (K64.3) Impression: 80 year old female with grade 4 internal hemorrhoids. I have recommended surgical excision. We have discussed this in the office and she has agreed to proceed. Risks include bleeding, infection, pain, stenosis and recurrence.

## 2015-04-03 DIAGNOSIS — S4992XD Unspecified injury of left shoulder and upper arm, subsequent encounter: Secondary | ICD-10-CM | POA: Diagnosis not present

## 2015-04-03 DIAGNOSIS — M75112 Incomplete rotator cuff tear or rupture of left shoulder, not specified as traumatic: Secondary | ICD-10-CM | POA: Diagnosis not present

## 2015-04-12 ENCOUNTER — Encounter (HOSPITAL_BASED_OUTPATIENT_CLINIC_OR_DEPARTMENT_OTHER): Payer: Self-pay | Admitting: *Deleted

## 2015-04-12 NOTE — Progress Notes (Signed)
NPO AFTER MN.  ARRIVE AT 0715.  NEEDS ISTAT .  CURRENT EKG IN CHART AND EPIC.  

## 2015-04-15 DIAGNOSIS — M7542 Impingement syndrome of left shoulder: Secondary | ICD-10-CM | POA: Diagnosis not present

## 2015-04-18 ENCOUNTER — Ambulatory Visit (HOSPITAL_BASED_OUTPATIENT_CLINIC_OR_DEPARTMENT_OTHER): Payer: Medicare Other | Admitting: Anesthesiology

## 2015-04-18 ENCOUNTER — Ambulatory Visit (HOSPITAL_BASED_OUTPATIENT_CLINIC_OR_DEPARTMENT_OTHER)
Admission: RE | Admit: 2015-04-18 | Discharge: 2015-04-18 | Disposition: A | Discharge status: Home / Self Care | Payer: Medicare Other | Source: Ambulatory Visit | Attending: General Surgery | Admitting: General Surgery

## 2015-04-18 ENCOUNTER — Encounter (HOSPITAL_BASED_OUTPATIENT_CLINIC_OR_DEPARTMENT_OTHER): Payer: Self-pay | Admitting: *Deleted

## 2015-04-18 ENCOUNTER — Encounter (HOSPITAL_BASED_OUTPATIENT_CLINIC_OR_DEPARTMENT_OTHER)
Admission: RE | Disposition: A | Discharge status: Home / Self Care | Payer: Self-pay | Source: Ambulatory Visit | Attending: General Surgery

## 2015-04-18 DIAGNOSIS — K643 Fourth degree hemorrhoids: Secondary | ICD-10-CM | POA: Diagnosis not present

## 2015-04-18 DIAGNOSIS — K649 Unspecified hemorrhoids: Secondary | ICD-10-CM | POA: Diagnosis present

## 2015-04-18 DIAGNOSIS — I10 Essential (primary) hypertension: Secondary | ICD-10-CM | POA: Insufficient documentation

## 2015-04-18 DIAGNOSIS — Z87891 Personal history of nicotine dependence: Secondary | ICD-10-CM | POA: Insufficient documentation

## 2015-04-18 DIAGNOSIS — E7800 Pure hypercholesterolemia, unspecified: Secondary | ICD-10-CM | POA: Diagnosis not present

## 2015-04-18 DIAGNOSIS — K648 Other hemorrhoids: Secondary | ICD-10-CM | POA: Diagnosis not present

## 2015-04-18 DIAGNOSIS — K642 Third degree hemorrhoids: Secondary | ICD-10-CM | POA: Diagnosis not present

## 2015-04-18 DIAGNOSIS — Z86010 Personal history of colonic polyps: Secondary | ICD-10-CM | POA: Diagnosis not present

## 2015-04-18 DIAGNOSIS — K219 Gastro-esophageal reflux disease without esophagitis: Secondary | ICD-10-CM | POA: Diagnosis not present

## 2015-04-18 HISTORY — PX: HEMORRHOID SURGERY: SHX153

## 2015-04-18 HISTORY — DX: Hyperlipidemia, unspecified: E78.5

## 2015-04-18 HISTORY — DX: Personal history of adenomatous and serrated colon polyps: Z86.0101

## 2015-04-18 HISTORY — DX: Unspecified disorder of synovium and tendon, left shoulder: M67.912

## 2015-04-18 HISTORY — DX: Other seasonal allergic rhinitis: J30.2

## 2015-04-18 HISTORY — DX: Fourth degree hemorrhoids: K64.3

## 2015-04-18 HISTORY — DX: Personal history of other diseases of the digestive system: Z87.19

## 2015-04-18 HISTORY — DX: Other allergic rhinitis: J30.89

## 2015-04-18 HISTORY — DX: Presence of external hearing-aid: Z97.4

## 2015-04-18 HISTORY — DX: Tinnitus, unspecified ear: H93.19

## 2015-04-18 HISTORY — DX: Personal history of colonic polyps: Z86.010

## 2015-04-18 HISTORY — DX: Personal history of malignant melanoma of skin: Z85.820

## 2015-04-18 HISTORY — DX: Presence of spectacles and contact lenses: Z97.3

## 2015-04-18 HISTORY — DX: Other specified postprocedural states: Z98.890

## 2015-04-18 LAB — POCT I-STAT 4, (NA,K, GLUC, HGB,HCT)
Glucose, Bld: 88 mg/dL (ref 65–99)
HCT: 39 % (ref 36.0–46.0)
Hemoglobin: 13.3 g/dL (ref 12.0–15.0)
Potassium: 3.5 mmol/L (ref 3.5–5.1)
SODIUM: 141 mmol/L (ref 135–145)

## 2015-04-18 SURGERY — HEMORRHOIDECTOMY
Anesthesia: Monitor Anesthesia Care | Site: Anus

## 2015-04-18 MED ORDER — SODIUM CHLORIDE 0.9% FLUSH
3.0000 mL | INTRAVENOUS | Status: DC | PRN
  Filled 2015-04-18: qty 3

## 2015-04-18 MED ORDER — DEXAMETHASONE SODIUM PHOSPHATE 4 MG/ML IJ SOLN
INTRAMUSCULAR | Status: DC | PRN
  Administered 2015-04-18: 10 mg via INTRAVENOUS

## 2015-04-18 MED ORDER — SODIUM CHLORIDE 0.9% FLUSH
3.0000 mL | Freq: Two times a day (BID) | INTRAVENOUS | Status: DC
Start: 2015-04-18 — End: 2015-04-18
  Filled 2015-04-18: qty 3

## 2015-04-18 MED ORDER — LIDOCAINE 5 % EX OINT
TOPICAL_OINTMENT | CUTANEOUS | Status: DC | PRN
  Administered 2015-04-18: 1 via TOPICAL

## 2015-04-18 MED ORDER — DEXAMETHASONE SODIUM PHOSPHATE 10 MG/ML IJ SOLN
INTRAMUSCULAR | Status: AC
  Filled 2015-04-18: qty 1

## 2015-04-18 MED ORDER — ONDANSETRON HCL 4 MG/2ML IJ SOLN
INTRAMUSCULAR | Status: AC
  Filled 2015-04-18: qty 2

## 2015-04-18 MED ORDER — SODIUM CHLORIDE 0.9 % IV SOLN
250.0000 mL | INTRAVENOUS | Status: DC | PRN
  Filled 2015-04-18: qty 250

## 2015-04-18 MED ORDER — ACETAMINOPHEN 650 MG RE SUPP
650.0000 mg | RECTAL | Status: DC | PRN
  Filled 2015-04-18: qty 1

## 2015-04-18 MED ORDER — KETAMINE HCL 10 MG/ML IJ SOLN
INTRAMUSCULAR | Status: AC
  Filled 2015-04-18: qty 1

## 2015-04-18 MED ORDER — PROPOFOL 500 MG/50ML IV EMUL
INTRAVENOUS | Status: DC | PRN
  Administered 2015-04-18: 150 ug/kg/min via INTRAVENOUS

## 2015-04-18 MED ORDER — ONDANSETRON HCL 4 MG/2ML IJ SOLN
INTRAMUSCULAR | Status: DC | PRN
  Administered 2015-04-18: 4 mg via INTRAVENOUS

## 2015-04-18 MED ORDER — MIDAZOLAM HCL 2 MG/2ML IJ SOLN
INTRAMUSCULAR | Status: AC
  Filled 2015-04-18: qty 2

## 2015-04-18 MED ORDER — SODIUM CHLORIDE 0.9 % IV SOLN
250.0000 mg | INTRAVENOUS | Status: DC | PRN
  Administered 2015-04-18: 5 ug/kg/min via INTRAVENOUS

## 2015-04-18 MED ORDER — TRAMADOL HCL 50 MG PO TABS: 50.0000 mg | ORAL_TABLET | Freq: Four times a day (QID) | ORAL | Status: DC | PRN

## 2015-04-18 MED ORDER — LIDOCAINE HCL (CARDIAC) 20 MG/ML IV SOLN
INTRAVENOUS | Status: DC | PRN
  Administered 2015-04-18: 50 mg via INTRAVENOUS

## 2015-04-18 MED ORDER — FENTANYL CITRATE (PF) 100 MCG/2ML IJ SOLN
INTRAMUSCULAR | Status: DC | PRN
  Administered 2015-04-18: 25 ug via INTRAVENOUS

## 2015-04-18 MED ORDER — FENTANYL CITRATE (PF) 100 MCG/2ML IJ SOLN
INTRAMUSCULAR | Status: AC
Start: 2015-04-18 — End: 2015-04-18
  Filled 2015-04-18: qty 2

## 2015-04-18 MED ORDER — MIDAZOLAM HCL 5 MG/5ML IJ SOLN
INTRAMUSCULAR | Status: DC | PRN
  Administered 2015-04-18: 1 mg via INTRAVENOUS

## 2015-04-18 MED ORDER — FENTANYL CITRATE (PF) 100 MCG/2ML IJ SOLN
25.0000 ug | INTRAMUSCULAR | Status: DC | PRN
  Filled 2015-04-18: qty 1

## 2015-04-18 MED ORDER — PROPOFOL 500 MG/50ML IV EMUL
INTRAVENOUS | Status: AC
  Filled 2015-04-18: qty 50

## 2015-04-18 MED ORDER — LACTATED RINGERS IV SOLN
INTRAVENOUS | Status: DC
  Administered 2015-04-18: 08:00:00 via INTRAVENOUS
  Filled 2015-04-18: qty 1000

## 2015-04-18 MED ORDER — ACETAMINOPHEN 325 MG PO TABS
650.0000 mg | ORAL_TABLET | ORAL | Status: DC | PRN
  Filled 2015-04-18: qty 2

## 2015-04-18 MED ORDER — BUPIVACAINE-EPINEPHRINE 0.5% -1:200000 IJ SOLN
INTRAMUSCULAR | Status: DC | PRN
  Administered 2015-04-18: 30 mL

## 2015-04-18 SURGICAL SUPPLY — 31 items
BLADE SURG 10 STRL SS (BLADE) ×2 IMPLANT
BRIEF STRETCH FOR OB PAD LRG (UNDERPADS AND DIAPERS) IMPLANT
COVER BACK TABLE 60X90IN (DRAPES) ×2 IMPLANT
COVER MAYO STAND STRL (DRAPES) ×2 IMPLANT
DRAPE LAPAROTOMY 100X72 PEDS (DRAPES) ×2 IMPLANT
DRAPE UTILITY XL STRL (DRAPES) ×2 IMPLANT
ELECT BLADE 6.5 .24CM SHAFT (ELECTRODE) ×2 IMPLANT
ELECT REM PT RETURN 9FT ADLT (ELECTROSURGICAL) ×2
ELECTRODE REM PT RTRN 9FT ADLT (ELECTROSURGICAL) ×1 IMPLANT
GLOVE BIO SURGEON STRL SZ 6.5 (GLOVE) ×2 IMPLANT
GLOVE INDICATOR 7.0 STRL GRN (GLOVE) ×2 IMPLANT
GOWN STRL REUS W/TWL 2XL LVL3 (GOWN DISPOSABLE) ×4 IMPLANT
KIT ROOM TURNOVER WOR (KITS) ×2 IMPLANT
NDL HYPO 25X1 1.5 SAFETY (NEEDLE) ×1 IMPLANT
NEEDLE HYPO 25X1 1.5 SAFETY (NEEDLE) ×2 IMPLANT
NS IRRIG 500ML POUR BTL (IV SOLUTION) ×2 IMPLANT
PACK BASIN DAY SURGERY FS (CUSTOM PROCEDURE TRAY) ×2 IMPLANT
PAD ABD 8X10 STRL (GAUZE/BANDAGES/DRESSINGS) ×2 IMPLANT
PAD ARMBOARD 7.5X6 YLW CONV (MISCELLANEOUS) IMPLANT
PENCIL BUTTON HOLSTER BLD 10FT (ELECTRODE) ×2 IMPLANT
SPONGE GAUZE 4X4 12PLY STER LF (GAUZE/BANDAGES/DRESSINGS) ×1 IMPLANT
SPONGE SURGIFOAM ABS GEL 100 (HEMOSTASIS) IMPLANT
SPONGE SURGIFOAM ABS GEL 12-7 (HEMOSTASIS) IMPLANT
STAPLER VISISTAT 35W (STAPLE) ×1 IMPLANT
SUT CHROMIC 2 0 SH (SUTURE) ×1 IMPLANT
SUT CHROMIC 3 0 SH 27 (SUTURE) ×1 IMPLANT
SYR CONTROL 10ML LL (SYRINGE) ×2 IMPLANT
TRAY DSU PREP LF (CUSTOM PROCEDURE TRAY) ×2 IMPLANT
TUBE CONNECTING 12X1/4 (SUCTIONS) ×2 IMPLANT
WATER STERILE IRR 500ML POUR (IV SOLUTION) IMPLANT
YANKAUER SUCT BULB TIP NO VENT (SUCTIONS) ×2 IMPLANT

## 2015-04-18 NOTE — H&P (Signed)
  The patient is a 80 year old female who presents with hemorrhoids. 80 year old female who presents to the office with worsening anal pain for approximately 9 months. This initially resolved with prescription hemorrhoid medication but she is now having more difficulties with it. She describes mostly pain and no bleeding. She denies any constipation and only endorses occasional straining. She denies any diarrhea. She has never had any hemorrhoid procedures in the past.   Other Problems Marjean Donna, [place]; 03/05/2015 10:23 AM) Hemorrhoids High blood pressure Hypercholesterolemia Melanoma  Past Surgical History Marjean Donna, CMA; 03/05/2015 10:23 AM) Colon Polyp Removal - Colonoscopy Spinal Surgery - Lower Back  Diagnostic Studies History Marjean Donna, CMA; 03/05/2015 10:23 AM) Mammogram 1-3 years ago Pap Smear >5 years ago  Allergies Marjean Donna, CMA; 03/05/2015 10:24 AM) Codeine Phosphate *ANALGESICS - OPIOID* Norvasc *CALCIUM CHANNEL BLOCKERS* Vasotec *ANTIHYPERTENSIVES*  Medication History (Sonya Bynum, CMA; 03/05/2015 10:25 AM) Losartan Potassium-HCTZ (100-12.5MG  Tablet, Oral) Active. Hydrocortisone (2.5% Cream, Rectal) Active. Medications Reconciled  Social History Marjean Donna, CMA; 03/05/2015 10:23 AM) Alcohol use Moderate alcohol use. Caffeine use Coffee. No drug use Tobacco use Former smoker.  Family History Marjean Donna, [place]; 03/05/2015 10:23 AM) Melanoma Father.  Pregnancy / Birth History Marjean Donna, [place]; 03/05/2015 10:23 AM) Age at menarche 86 years. Age of menopause 62-50 Gravida 2 Maternal age 28-25 Para 2  BP 171/72 mmHg  Pulse 82  Temp(Src) 98 F (36.7 C) (Oral)  Resp 14  Ht 5\' 1"  (1.549 m)  Wt 48.308 kg (106 lb 8 oz)  BMI 20.13 kg/m2  SpO2 100%    Physical Exam   General Mental Status-Alert. General Appearance-Not in acute distress. Build & Nutrition-Well nourished. Posture-Normal  posture. Gait-Normal.  Head and Neck Head-normocephalic, atraumatic with no lesions or palpable masses. Trachea-midline.  Chest and Lung Exam Chest and lung exam reveals -on auscultation, normal breath sounds, no adventitious sounds and normal vocal resonance.  Cardiovascular Cardiovascular examination reveals -normal heart sounds, regular rate and rhythm with no murmurs.  Abdomen Inspection Inspection of the abdomen reveals - No Hernias. Palpation/Percussion Palpation and Percussion of the abdomen reveal - Soft, Non Tender, No Rigidity (guarding), No hepatosplenomegaly and No Palpable abdominal masses.  Rectal Anorectal Exam External - Note: Edematous external hemorrhoids, grade 4 prolapsing internal hemorrhoid noted in at least 2 locations.  Neurologic Neurologic evaluation reveals -alert and oriented x 3 with no impairment of recent or remote memory, normal attention span and ability to concentrate, normal sensation and normal coordination.  Musculoskeletal Normal Exam - Bilateral-Upper Extremity Strength Normal and Lower Extremity Strength Normal.    Assessment & Plan  PROLAPSED INTERNAL HEMORRHOIDS, GRADE 4 (K64.3) Impression: 80 year old female with grade 4 internal hemorrhoids. I have recommended surgical excision. We have discussed this in the office and she has agreed to proceed. Risks include bleeding, infection, pain, stenosis and recurrence.

## 2015-04-18 NOTE — Op Note (Signed)
04/18/2015  9:13 AM  PATIENT:  Vicki Garcia  80 y.o. female  Patient Care Team: Hendricks Limes, MD as PCP - General  PRE-OPERATIVE DIAGNOSIS:  Grade 4 internal hemorrhoid  POST-OPERATIVE DIAGNOSIS:  Grade 3 internal hemorrhoids  PROCEDURE:  SINGLE COLUMN HEMORRHOIDECTOMY    Surgeon(s): Leighton Ruff, MD  ASSISTANT: none   ANESTHESIA:   local and MAC  SPECIMEN:  Source of Specimen:  Left posteriolateral hemorrhoid  DISPOSITION OF SPECIMEN:  PATHOLOGY  COUNTS:  YES  PLAN OF CARE: Discharge to home after PACU  PATIENT DISPOSITION:  PACU - hemodynamically stable.  INDICATION: 80 y.o. F with rectal bleeding.  She presented to my office with a prolapsing grade 4 hemorrhoid.  I recommended excision   OR FINDINGS: grade 3 L posterolateral hemorrhoid  DESCRIPTION: the patient was identified in the preoperative holding area and taken to the OR where they were laid on the operating room table.  MAC anesthesia was induced without difficulty. The patient was then positioned in prone jackknife position with buttocks gently taped apart.  The patient was then prepped and draped in usual sterile fashion.  SCDs were noted to be in place prior to the initiation of anesthesia. A surgical timeout was performed indicating the correct patient, procedure, positioning and need for preoperative antibiotics.  A rectal block was performed using Marcaine with epinephrine.    I began with a digital rectal exam.  There were no masses.  Rectal tone was reduced but adequate.  I then placed a Hill-Ferguson anoscope into the anal canal and evaluated this completely.  There was a left sided hemorrhoid complex in the posterolateral position.  This seemed to be the biggest area.  There was a small external component.  There was a hypertrophied anal papilla in this area as well.  The right side seemed ok.  I resected the posterior complex using scissors.  The defect was closed with a running 2-0 Chromic suture and  a running 3-0 Chromic suture for the external component.  I removed the papilla as well and closed this with a 3-0 Chromic suture.  Hemostasis was good.  Additional marcaine was injected.  A dressing was applied.  The patient was then awakened from anesthesia and sent to the PACU in stable condition.  All counts were correct per OR staff.

## 2015-04-18 NOTE — Discharge Instructions (Addendum)

## 2015-04-18 NOTE — Anesthesia Postprocedure Evaluation (Signed)
Anesthesia Post Note  Patient: Anitra Sedgwick  Procedure(s) Performed: Procedure(s) (LRB): HEMORRHOIDECTOMY (N/A)  Patient location during evaluation: PACU Anesthesia Type: General Level of consciousness: awake Pain management: pain level controlled Vital Signs Assessment: post-procedure vital signs reviewed and stable Respiratory status: spontaneous breathing Cardiovascular status: stable Anesthetic complications: no    Last Vitals:  Filed Vitals:   04/18/15 0930 04/18/15 0945  BP: 133/61 137/65  Pulse: 72 66  Temp:    Resp: 15 14    Last Pain: There were no vitals filed for this visit.               EDWARDS,Patricia Perales

## 2015-04-18 NOTE — Anesthesia Procedure Notes (Signed)
Procedure Name: MAC Date/Time: 04/18/2015 8:42 AM Performed by: Wanita Chamberlain Pre-anesthesia Checklist: Patient identified, Timeout performed, Emergency Drugs available, Suction available and Patient being monitored Patient Re-evaluated:Patient Re-evaluated prior to inductionOxygen Delivery Method: Nasal cannula Intubation Type: IV induction Placement Confirmation: positive ETCO2

## 2015-04-18 NOTE — Anesthesia Preprocedure Evaluation (Addendum)
Anesthesia Evaluation  Patient identified by MRN, date of birth, ID band Patient awake    Reviewed: Allergy & Precautions, NPO status   Airway Mallampati: II  TM Distance: >3 FB Neck ROM: Full    Dental  (+) Dental Advisory Given, Teeth Intact,    Pulmonary shortness of breath, former smoker,    breath sounds clear to auscultation       Cardiovascular hypertension, Pt. on medications  Rhythm:Regular Rate:Normal     Neuro/Psych    GI/Hepatic Neg liver ROS, GERD  Medicated and Controlled,  Endo/Other  negative endocrine ROS  Renal/GU negative Renal ROS     Musculoskeletal   Abdominal   Peds  Hematology   Anesthesia Other Findings   Reproductive/Obstetrics                           Anesthesia Physical Anesthesia Plan  ASA: III  Anesthesia Plan: MAC   Post-op Pain Management:    Induction: Intravenous  Airway Management Planned: LMA and Simple Face Mask  Additional Equipment:   Intra-op Plan:   Post-operative Plan:   Informed Consent:   Dental advisory given  Plan Discussed with: CRNA and Anesthesiologist  Anesthesia Plan Comments:         Anesthesia Quick Evaluation

## 2015-04-18 NOTE — Transfer of Care (Signed)
Immediate Anesthesia Transfer of Care Note  Patient: Vicki Garcia  Procedure(s) Performed: Procedure(s): HEMORRHOIDECTOMY (N/A)  Patient Location: PACU  Anesthesia Type:MAC  Level of Consciousness: awake, alert , oriented and patient cooperative  Airway & Oxygen Therapy: Patient Spontanous Breathing and Patient connected to nasal cannula oxygen  Post-op Assessment: Report given to RN and Post -op Vital signs reviewed and stable  Post vital signs: Reviewed and stable  Last Vitals:  Filed Vitals:   04/18/15 0725  BP: 171/72  Pulse: 82  Temp: 36.7 C  Resp: 14    Complications: No apparent anesthesia complications

## 2015-04-19 ENCOUNTER — Encounter (HOSPITAL_BASED_OUTPATIENT_CLINIC_OR_DEPARTMENT_OTHER): Payer: Self-pay | Admitting: General Surgery

## 2015-05-08 ENCOUNTER — Telehealth: Payer: Self-pay

## 2015-05-08 NOTE — Telephone Encounter (Signed)
Spoke to pt - pt is not sure who to transfer to.

## 2015-05-13 ENCOUNTER — Telehealth: Payer: Self-pay

## 2015-05-24 ENCOUNTER — Telehealth: Payer: Self-pay | Admitting: Internal Medicine

## 2015-05-24 NOTE — Telephone Encounter (Signed)
Pt request to transfer from Dr. Linna Darner you Dr. Ronnald Ramp. Please advise.

## 2015-05-28 DIAGNOSIS — H2513 Age-related nuclear cataract, bilateral: Secondary | ICD-10-CM | POA: Diagnosis not present

## 2015-05-28 DIAGNOSIS — H40052 Ocular hypertension, left eye: Secondary | ICD-10-CM | POA: Diagnosis not present

## 2015-05-28 DIAGNOSIS — H40013 Open angle with borderline findings, low risk, bilateral: Secondary | ICD-10-CM | POA: Diagnosis not present

## 2015-05-28 DIAGNOSIS — H40051 Ocular hypertension, right eye: Secondary | ICD-10-CM | POA: Diagnosis not present

## 2015-05-28 NOTE — Telephone Encounter (Signed)
error 

## 2015-06-01 ENCOUNTER — Other Ambulatory Visit: Payer: Self-pay | Admitting: Internal Medicine

## 2015-06-02 NOTE — Telephone Encounter (Signed)
Ok with me 

## 2015-06-05 ENCOUNTER — Ambulatory Visit: Payer: Medicare Other | Admitting: Internal Medicine

## 2015-06-06 ENCOUNTER — Telehealth: Payer: Self-pay

## 2015-06-06 ENCOUNTER — Ambulatory Visit (INDEPENDENT_AMBULATORY_CARE_PROVIDER_SITE_OTHER): Payer: Medicare Other | Admitting: Internal Medicine

## 2015-06-06 ENCOUNTER — Encounter: Payer: Self-pay | Admitting: Internal Medicine

## 2015-06-06 VITALS — BP 126/70 | HR 77 | Temp 98.7°F | Resp 16 | Ht 61.0 in | Wt 106.0 lb

## 2015-06-06 DIAGNOSIS — F068 Other specified mental disorders due to known physiological condition: Secondary | ICD-10-CM

## 2015-06-06 DIAGNOSIS — K648 Other hemorrhoids: Secondary | ICD-10-CM | POA: Insufficient documentation

## 2015-06-06 DIAGNOSIS — F0390 Unspecified dementia without behavioral disturbance: Secondary | ICD-10-CM

## 2015-06-06 MED ORDER — HYDROCORTISONE ACE-PRAMOXINE 1-1 % RE FOAM: 1.0000 | Freq: Two times a day (BID) | RECTAL | Status: DC

## 2015-06-06 MED ORDER — MEMANTINE HCL-DONEPEZIL HCL 7 & 14 & 21 &28 -10 MG PO C4PK: 1.0000 | EXTENDED_RELEASE_CAPSULE | Freq: Every day | ORAL | Status: DC

## 2015-06-06 NOTE — Telephone Encounter (Signed)
Received fax stating proctofoam is not covered. Alternative?

## 2015-06-06 NOTE — Progress Notes (Signed)
Pre visit review using our clinic review tool, if applicable. No additional management support is needed unless otherwise documented below in the visit note. 

## 2015-06-06 NOTE — Patient Instructions (Signed)

## 2015-06-06 NOTE — Progress Notes (Signed)
Subjective:  Patient ID: Vicki Garcia, female    DOB: 1935-12-28  Age: 80 y.o. MRN: [id number]  CC: Rectal Pain   HPI Galena Logie presents for concerns about anal irritation and declining memory. She underwent internal hemorrhoidectomy about 2 months ago but since then complains of persistent anal irritation. She has not treated this.  She denies anal or rectal bleeding and she denies constipation, diarrhea, or straining.  Her husband also complains about her declining memory. He said she is very forgetful and is starting to have trouble with her activities of daily living. He has noticed a general decline over the last year or 2 but says there has been nothing abrupt that is occurred in the last few months.  Outpatient Prescriptions Prior to Visit  Medication Sig Dispense Refill  . latanoprost (XALATAN) 0.005 % ophthalmic solution Place 1 drop into both eyes at bedtime.     Marland Kitchen losartan-hydrochlorothiazide (HYZAAR) 100-12.5 MG tablet TAKE 1 TABLET ONCE DAILY. 90 tablet 0  . traMADol (ULTRAM) 50 MG tablet Take 1-2 tablets (50-100 mg total) by mouth every 6 (six) hours as needed. 30 tablet 0   No facility-administered medications prior to visit.    ROS Review of Systems  Constitutional: Negative.   HENT: Negative.   Eyes: Negative.   Respiratory: Negative.  Negative for cough, choking, chest tightness, shortness of breath and stridor.   Cardiovascular: Negative.  Negative for chest pain, palpitations and leg swelling.  Gastrointestinal: Negative.  Negative for nausea, vomiting, abdominal pain, diarrhea and constipation.  Endocrine: Negative.   Genitourinary: Negative.   Musculoskeletal: Negative.  Negative for myalgias, back pain, joint swelling and arthralgias.  Skin: Negative.  Negative for color change and rash.  Allergic/Immunologic: Negative.   Neurological: Negative for dizziness, weakness and numbness.  Hematological: Negative.  Negative for adenopathy. Does not bruise/bleed  easily.  Psychiatric/Behavioral: Positive for confusion and decreased concentration. Negative for suicidal ideas, hallucinations, behavioral problems, sleep disturbance, self-injury, dysphoric mood and agitation. The patient is not nervous/anxious and is not hyperactive.     Objective:  BP 126/70 mmHg  Pulse 77  Temp(Src) 98.7 F (37.1 C) (Oral)  Resp 16  Ht 5' 1"  (1.549 m)  Wt 106 lb (48.081 kg)  BMI 20.04 kg/m2  SpO2 97%  BP Readings from Last 3 Encounters:  06/07/15 168/82  06/06/15 126/70  04/18/15 145/64    Wt Readings from Last 3 Encounters:  06/07/15 106 lb (48.081 kg)  06/06/15 106 lb (48.081 kg)  04/18/15 106 lb 8 oz (48.308 kg)    Physical Exam  Constitutional: She is oriented to person, place, and time. No distress.  HENT:  Mouth/Throat: Oropharynx is clear and moist. No oropharyngeal exudate.  Eyes: Conjunctivae are normal. Right eye exhibits no discharge. Left eye exhibits no discharge. No scleral icterus.  Neck: Normal range of motion. Neck supple. No JVD present. No tracheal deviation present. No thyromegaly present.  Cardiovascular: Normal rate, regular rhythm, normal heart sounds and intact distal pulses.  Exam reveals no gallop and no friction rub.   No murmur heard. Pulmonary/Chest: Effort normal and breath sounds normal. No stridor. No respiratory distress. She has no wheezes. She has no rales. She exhibits no tenderness.  Abdominal: Soft. Bowel sounds are normal. She exhibits no distension and no mass. There is no tenderness. There is no rebound and no guarding.  Genitourinary: Rectal exam shows external hemorrhoid and internal hemorrhoid. Rectal exam shows no fissure, no mass, no tenderness and anal tone normal. Guaiac  negative stool.  There are mild, uncomplicated internal and external anal hemorrhoids with no thrombosis, mass, or bleeding. There is also no tenderness.  Musculoskeletal: Normal range of motion. She exhibits no edema or tenderness.    Lymphadenopathy:    She has no cervical adenopathy.  Neurological: She is oriented to person, place, and time.  Skin: Skin is warm and dry. No rash noted. She is not diaphoretic. No erythema. No pallor.  Psychiatric: She has a normal mood and affect. Judgment and thought content normal. Her mood appears not anxious. Her affect is not angry. Her speech is delayed and tangential. Her speech is not rapid and/or pressured and not slurred. She is slowed and withdrawn. She is not agitated, not aggressive and not combative. Cognition and memory are normal. She does not exhibit a depressed mood. She expresses no homicidal and no suicidal ideation. She expresses no suicidal plans and no homicidal plans. She is communicative. She is inattentive.  Vitals reviewed.   Lab Results  Component Value Date   WBC 5.7 02/25/2015   HGB 13.3 04/18/2015   HCT 39.0 04/18/2015   PLT 305.0 02/25/2015   GLUCOSE 88 04/18/2015   CHOL 157 07/30/2014   TRIG 61.0 07/30/2014   HDL 70.10 07/30/2014   LDLCALC 75 07/30/2014   ALT 11 02/25/2015   AST 16 02/25/2015   NA 141 04/18/2015   K 3.5 04/18/2015   CL 98 02/25/2015   CREATININE 0.78 02/25/2015   BUN 19 02/25/2015   CO2 32 02/25/2015   TSH 2.11 02/25/2015   HGBA1C 5.8 02/25/2015    No results found.  Assessment & Plan:   Roselind was seen today for rectal pain.  Diagnoses and all orders for this visit:  Dementia arising in the senium and presenium- I think she would benefit from a combination of memantine and donepezil, I've given her samples of Namzaric and have shown she and her husband how to take the sample kit. -     Memantine HCl-Donepezil HCl (NAMZARIC) 7 & 14 & 21 &28 -10 MG C4PK; Take 1 capsule by mouth daily.  Hemorrhoids, internal- I do not see any complications at this time, I think she is symptomatic from internal anal hemorrhoids and of asked her to use the topical combination of hydrocortisone and pramoxine -     Discontinue:  hydrocortisone-pramoxine (PROCTOFOAM-HC) rectal foam; Place 1 applicator rectally 2 (two) times daily.   I am having Ms. Kassing start on Memantine HCl-Donepezil HCl. I am also having her maintain her latanoprost, traMADol, and losartan-hydrochlorothiazide.  Meds ordered this encounter  Medications  . DISCONTD: hydrocortisone-pramoxine (PROCTOFOAM-HC) rectal foam    Sig: Place 1 applicator rectally 2 (two) times daily.    Dispense:  10 g    Refill:  2  . Memantine HCl-Donepezil HCl (NAMZARIC) 7 & 14 & 21 &28 -10 MG C4PK    Sig: Take 1 capsule by mouth daily.    Dispense:  28 each    Refill:  0     Follow-up: Return in about 4 weeks (around 07/04/2015).  Scarlette Calico, MD

## 2015-06-07 ENCOUNTER — Ambulatory Visit (INDEPENDENT_AMBULATORY_CARE_PROVIDER_SITE_OTHER): Payer: Medicare Other | Admitting: Adult Health

## 2015-06-07 ENCOUNTER — Encounter: Payer: Self-pay | Admitting: Adult Health

## 2015-06-07 ENCOUNTER — Telehealth: Payer: Self-pay | Admitting: Internal Medicine

## 2015-06-07 ENCOUNTER — Telehealth: Payer: Self-pay

## 2015-06-07 ENCOUNTER — Ambulatory Visit: Payer: Self-pay | Admitting: Adult Health

## 2015-06-07 VITALS — BP 168/82 | Temp 97.6°F | Ht 61.0 in | Wt 106.0 lb

## 2015-06-07 DIAGNOSIS — R110 Nausea: Secondary | ICD-10-CM | POA: Diagnosis not present

## 2015-06-07 MED ORDER — ONDANSETRON HCL 4 MG/2ML IJ SOLN
4.0000 mg | Freq: Once | INTRAMUSCULAR | Status: AC
  Administered 2015-06-07: 4 mg via INTRAMUSCULAR

## 2015-06-07 NOTE — Progress Notes (Signed)
Subjective:    Patient ID: Vicki Garcia, female    DOB: Jun 12, 1935, 80 y.o.   MRN: [id number]  HPI  80 year old female who presents to the office today for possible reaction to medication. She was prescribed Namzaric yesterday by her PCP. After she took it this morning around 10 am , she started to feel nauseated and has continued to feel this way throughout the day.   Denies fevers, vomiting, diarrhea.    Review of Systems  Constitutional: Negative.   Respiratory: Negative.   Cardiovascular: Negative.   Gastrointestinal: Positive for nausea, vomiting and rectal pain. Negative for abdominal pain, diarrhea and abdominal distention.  Neurological: Negative.   Hematological: Negative.   All other systems reviewed and are negative.  Past Medical History  Diagnosis Date  . Hypertension   . Diverticulosis of colon     AVM @ colonoscopy 07/2008  . GERD (gastroesophageal reflux disease)   . History of adenomatous polyp of colon     2010  . Hyperlipidemia   . History of melanoma excision     1987  right upper arm  . History of esophagitis     07-/ 2014  . History of esophageal dilatation     07/ 2014  . Prolapsed internal hemorrhoids, grade 4   . Seasonal and perennial allergic rhinitis   . Disorder of left rotator cuff   . Tinnitus     chronic  . Wears glasses   . Wears hearing aid     bilateral    Social History   Social History  . Marital Status: Married    Spouse Name: N/A  . Number of Children: N/A  . Years of Education: N/A   Occupational History  . Not on file.   Social History Main Topics  . Smoking status: Former Smoker -- 10 years    Types: Cigarettes    Quit date: 01/19/1969  . Smokeless tobacco: Never Used  . Alcohol Use: 3.0 oz/week    5 Glasses of wine per week     Comment: occasional  . Drug Use: No  . Sexual Activity: Not on file     Comment: G1  P1  M1   Other Topics Concern  . Not on file   Social History Narrative   Regular exercise:  Strength Training & walking w/o symptoms          Past Surgical History  Procedure Laterality Date  . Colonoscopy  last one 07-28-2012  . Lumbar fusion  11-19-2006    L4 - L5  . Upper gastrointestinal endoscopy  07-28-2012  . Hemorrhoid surgery N/A 04/18/2015    Procedure: HEMORRHOIDECTOMY;  Surgeon: Leighton Ruff, MD;  Location: [facility];  Service: General;  Laterality: N/A;    Family History  Problem Relation Age of Onset  . Coronary artery disease Father   . Hypertension Brother   . Skin cancer Brother     also spinal stenosis  . Kidney cancer Brother   . Stomach cancer Brother   . Prostate cancer Brother   . Breast cancer Paternal Aunt   . Heart attack Paternal Uncle 53  . Lymphoma Mother     NHL  . Hemolytic uremic syndrome      granddaughter  . Lung cancer Brother     liver mets  . Colon cancer Maternal Uncle   . Ulcers Daughter   . Stroke Neg Hx     Allergies  Allergen Reactions  . Codeine  Other (See Comments)    REACTION: violently ill with N&V  . Norvasc [Amlodipine Besylate] Nausea And Vomiting  . Vasotec Other (See Comments)    cough    Current Outpatient Prescriptions on File Prior to Visit  Medication Sig Dispense Refill  . hydrocortisone-pramoxine (PROCTOFOAM-HC) rectal foam Place 1 applicator rectally 2 (two) times daily. 10 g 2  . latanoprost (XALATAN) 0.005 % ophthalmic solution Place 1 drop into both eyes at bedtime.     Marland Kitchen losartan-hydrochlorothiazide (HYZAAR) 100-12.5 MG tablet TAKE 1 TABLET ONCE DAILY. 90 tablet 0  . Memantine HCl-Donepezil HCl (NAMZARIC) 7 & 14 & 21 &28 -10 MG C4PK Take 1 capsule by mouth daily. 28 each 0  . traMADol (ULTRAM) 50 MG tablet Take 1-2 tablets (50-100 mg total) by mouth every 6 (six) hours as needed. 30 tablet 0   No current facility-administered medications on file prior to visit.    BP 168/82 mmHg  Temp(Src) 97.6 F (36.4 C) (Oral)  Ht 5\' 1"  (1.549 m)  Wt 106 lb (48.081 kg)  BMI 20.04  kg/m2       Objective:   Physical Exam  Constitutional: She is oriented to person, place, and time. She appears well-developed and well-nourished. No distress.  Cardiovascular: Normal rate, regular rhythm, normal heart sounds and intact distal pulses.  Exam reveals no gallop.   No murmur heard. Pulmonary/Chest: Effort normal and breath sounds normal. No respiratory distress. She has no wheezes. She has no rales. She exhibits no tenderness.  Abdominal: Soft. Bowel sounds are normal. She exhibits no distension and no mass. There is tenderness. There is no rebound and no guarding.  Musculoskeletal: Normal range of motion. She exhibits no edema or tenderness.  Neurological: She is alert and oriented to person, place, and time.  Skin: Skin is warm and dry. No rash noted. She is not diaphoretic. No erythema. No pallor.  Psychiatric: She has a normal mood and affect. Her behavior is normal. Judgment and thought content normal.  Nursing note and vitals reviewed.     Assessment & Plan:  1. Nausea without vomiting - Nausea may be from medication or possibly viral GI issue. Advised not to take Namzaric and follow up with Dr. Ronnald Ramp next week to discuss this.  - ondansetron (ZOFRAN) injection 4 mg; Inject 2 mLs (4 mg total) into the muscle once.   She has not taken her blood pressure medication today. Advised to take when she gets home.   Dorothyann Peng, NP

## 2015-06-07 NOTE — Telephone Encounter (Signed)
procto-med. (Hydrocortisone cream) BID 30 grams 2.5% cream gave verbal. Ins will cover 6.00 co pay. Per pharmacist

## 2015-06-07 NOTE — Telephone Encounter (Signed)
Patient Name: Vicki Garcia  DOB: 22-Jan-1935    Initial Comment Caller states she's on medication for her memory. She's having a lot of anxiety, jittery.    Nurse Assessment  Nurse: Mallie Mussel, RN, Alveta Heimlich Date/Time Eilene Ghazi Time): 06/07/2015 11:19:04 AM  Confirm and document reason for call. If symptomatic, describe symptoms. You must click the next button to save text entered. ---Caller states that she was prescribed a new medication yesterday for her memory. The medication is Namazeric 7/10mg . She states she is dizzy, light headed and feels like she will vomit. She has only taken the one dose this morning. Per [employer]'s site, this could be a serious side effect. https://www.drugs.com/sfx/namzaric-side-effects.html  Has the patient traveled out of the country within the last 30 days? ---No  Does the patient have any new or worsening symptoms? ---Yes  Will a triage be completed? ---Yes  Related visit to physician within the last 2 weeks? ---Yes  Does the PT have any chronic conditions? (i.e. diabetes, asthma, etc.) ---Yes  List chronic conditions. ---Memory Problems, HTN,  Is this a behavioral health or substance abuse call? ---No     Guidelines    Guideline Title Affirmed Question Affirmed Notes  Dizziness - Lightheadedness Patient sounds very sick or weak to the triager    Final Disposition User   Go to ED Now (or PCP triage) Mallie Mussel, RN, Alveta Heimlich    Comments  There were no appointments available at [facility]. I scheduled her to be seen by Dorothyann Peng NP at [facility] for 2:30pm today. Husband did state that if she gets better, he might call and cancel the appointment.   Referrals  REFERRED TO PCP OFFICE   Disagree/Comply: Comply

## 2015-06-07 NOTE — Patient Instructions (Signed)
It was great meeting you today.   Refrain from taking Namzaric until you follow up with Dr. Ronnald Ramp.   Take blood pressure medication when you get home.

## 2015-06-07 NOTE — Telephone Encounter (Signed)
Her insurance does not cover topical products for hemorrhoids Her best option is to pay for this out of pocket

## 2015-06-07 NOTE — Telephone Encounter (Signed)
Patient called and was having a reacting to a medication Dr. Ronnald Ramp gave her yesterday. Got advise to send her to [facility].

## 2015-06-09 MED ORDER — HYDROCORTISONE 2.5 % RE CREA: 1.0000 "application " | TOPICAL_CREAM | Freq: Two times a day (BID) | RECTAL | Status: DC

## 2015-06-09 NOTE — Telephone Encounter (Signed)
changed

## 2015-06-10 ENCOUNTER — Telehealth: Payer: Self-pay | Admitting: Internal Medicine

## 2015-06-10 NOTE — Telephone Encounter (Signed)
Pt reports side effects w med. Nausea and diarhea wanted to inform pcp. Will d/c

## 2015-06-10 NOTE — Telephone Encounter (Signed)
Has questions about a medication.  Did not want to tell me anymore than that.

## 2015-07-18 ENCOUNTER — Encounter: Payer: Self-pay | Admitting: Family

## 2015-07-18 ENCOUNTER — Ambulatory Visit (INDEPENDENT_AMBULATORY_CARE_PROVIDER_SITE_OTHER): Payer: Medicare Other | Admitting: Family

## 2015-07-18 VITALS — BP 140/86 | HR 80 | Temp 99.2°F | Ht 61.0 in | Wt 106.0 lb

## 2015-07-18 DIAGNOSIS — J029 Acute pharyngitis, unspecified: Secondary | ICD-10-CM | POA: Diagnosis not present

## 2015-07-18 LAB — POCT RAPID STREP A (OFFICE): RAPID STREP A SCREEN: NEGATIVE

## 2015-07-18 MED ORDER — LIDOCAINE VISCOUS 2 % MT SOLN: 15.0000 mL | OROMUCOSAL | Status: DC | PRN

## 2015-07-18 NOTE — Progress Notes (Signed)
Pre visit review using our clinic review tool, if applicable. No additional management support is needed unless otherwise documented below in the visit note. 

## 2015-07-18 NOTE — Patient Instructions (Signed)
Salt water gargles.  If there is no improvement in your symptoms, or if there is any worsening of symptoms, or if you have any additional concerns, please return for re-evaluation; or, if we are closed, consider going to the Emergency Room for evaluation if symptoms urgent.

## 2015-07-18 NOTE — Progress Notes (Signed)
Subjective:    Patient ID: Vicki Garcia, female    DOB: 16-Mar-1935, 80 y.o.   MRN: [id number]   Vicki Garcia is a 80 y.o. female who presents today for an acute visit.    HPI Comments: Patient here for evaluation of sore throat 2 days ago, worsening. Endorses nasal congestion, sinus congestion. No cough, wheezing. Took ASA for mild relief.   Past Medical History  Diagnosis Date  . Hypertension   . Diverticulosis of colon     AVM @ colonoscopy 07/2008  . GERD (gastroesophageal reflux disease)   . History of adenomatous polyp of colon     2010  . Hyperlipidemia   . History of melanoma excision     1987  right upper arm  . History of esophagitis     07-/ 2014  . History of esophageal dilatation     07/ 2014  . Prolapsed internal hemorrhoids, grade 4   . Seasonal and perennial allergic rhinitis   . Disorder of left rotator cuff   . Tinnitus     chronic  . Wears glasses   . Wears hearing aid     bilateral   Allergies: Codeine; Namzaric; Norvasc; and Vasotec Current Outpatient Prescriptions on File Prior to Visit  Medication Sig Dispense Refill  . hydrocortisone (PROCTO-MED HC) 2.5 % rectal cream Place 1 application rectally 2 (two) times daily. 30 g 11  . latanoprost (XALATAN) 0.005 % ophthalmic solution Place 1 drop into both eyes at bedtime.     Marland Kitchen losartan-hydrochlorothiazide (HYZAAR) 100-12.5 MG tablet TAKE 1 TABLET ONCE DAILY. 90 tablet 0  . traMADol (ULTRAM) 50 MG tablet Take 1-2 tablets (50-100 mg total) by mouth every 6 (six) hours as needed. 30 tablet 0   No current facility-administered medications on file prior to visit.    Social History  Substance Use Topics  . Smoking status: Former Smoker -- 10 years    Types: Cigarettes    Quit date: 01/19/1969  . Smokeless tobacco: Never Used  . Alcohol Use: 3.0 oz/week    5 Glasses of wine per week     Comment: occasional    Review of Systems  Constitutional: Negative for fever and chills.  HENT: Positive for  congestion, rhinorrhea and sore throat. Negative for sinus pressure.   Respiratory: Negative for cough, shortness of breath and wheezing.   Cardiovascular: Negative for chest pain and palpitations.  Gastrointestinal: Negative for nausea and vomiting.      Objective:    BP 140/86 mmHg  Pulse 80  Temp(Src) 99.2 F (37.3 C) (Oral)  Ht 5\' 1"  (1.549 m)  Wt 106 lb (48.081 kg)  BMI 20.04 kg/m2  SpO2 98%   Physical Exam  Constitutional: She appears well-developed and well-nourished.  HENT:  Head: Normocephalic and atraumatic.  Right Ear: Hearing, tympanic membrane, external ear and ear canal normal. No drainage, swelling or tenderness. No foreign bodies. Tympanic membrane is not erythematous and not bulging. No middle ear effusion. No decreased hearing is noted.  Left Ear: Hearing, tympanic membrane, external ear and ear canal normal. No drainage, swelling or tenderness. No foreign bodies. Tympanic membrane is not erythematous and not bulging.  No middle ear effusion. No decreased hearing is noted.  Nose: Nose normal. No rhinorrhea. Right sinus exhibits no maxillary sinus tenderness and no frontal sinus tenderness. Left sinus exhibits no maxillary sinus tenderness and no frontal sinus tenderness.  Mouth/Throat: Uvula is midline and mucous membranes are normal. Posterior oropharyngeal erythema present. No oropharyngeal  exudate, posterior oropharyngeal edema or tonsillar abscesses.  Eyes: Conjunctivae are normal.  Cardiovascular: Regular rhythm, normal heart sounds and normal pulses.   Pulmonary/Chest: Effort normal and breath sounds normal. She has no wheezes. She has no rhonchi. She has no rales.  Lymphadenopathy:       Head (right side): No submental, no submandibular, no tonsillar, no preauricular, no posterior auricular and no occipital adenopathy present.       Head (left side): No submental, no submandibular, no tonsillar, no preauricular, no posterior auricular and no occipital  adenopathy present.    She has no cervical adenopathy.  Neurological: She is alert.  Skin: Skin is warm and dry.  Psychiatric: She has a normal mood and affect. Her speech is normal and behavior is normal. Thought content normal.  Vitals reviewed.      Assessment & Plan:   1. Sore throat Viral. Strep negative. Patient agreed on conservative treatment at this time.  - lidocaine (XYLOCAINE) 2 % solution; Use as directed 15 mLs in the mouth or throat every 3 (three) hours as needed for mouth pain (gargle; may spit or swallow).  Dispense: 100 mL; Refill: 0    I am having Vicki Garcia maintain her latanoprost, traMADol, losartan-hydrochlorothiazide, and hydrocortisone.   No orders of the defined types were placed in this encounter.     Start medications as prescribed and explained to patient on After Visit Summary ( AVS). Risks, benefits, and alternatives of the medications and treatment plan prescribed today were discussed, and patient expressed understanding.   Education regarding symptom management and diagnosis given to patient.   Follow-up:Plan follow-up and return precautions given if any worsening symptoms or change in condition.   Continue to follow with Scarlette Calico, MD for routine health maintenance.   Vicki Garcia and I agreed with plan.   Mable Paris, FNP

## 2015-07-28 DIAGNOSIS — R0902 Hypoxemia: Secondary | ICD-10-CM | POA: Diagnosis not present

## 2015-07-28 DIAGNOSIS — R531 Weakness: Secondary | ICD-10-CM | POA: Diagnosis not present

## 2015-07-28 DIAGNOSIS — Z9989 Dependence on other enabling machines and devices: Secondary | ICD-10-CM | POA: Diagnosis not present

## 2015-07-28 DIAGNOSIS — I158 Other secondary hypertension: Secondary | ICD-10-CM | POA: Diagnosis not present

## 2015-07-28 DIAGNOSIS — R55 Syncope and collapse: Secondary | ICD-10-CM | POA: Diagnosis not present

## 2015-08-23 ENCOUNTER — Other Ambulatory Visit: Payer: Self-pay | Admitting: Internal Medicine

## 2015-10-31 ENCOUNTER — Ambulatory Visit: Payer: Medicare Other | Admitting: Nurse Practitioner

## 2015-11-05 ENCOUNTER — Ambulatory Visit: Payer: Medicare Other | Admitting: Internal Medicine

## 2015-11-11 ENCOUNTER — Encounter: Payer: Self-pay | Admitting: Internal Medicine

## 2015-11-11 ENCOUNTER — Other Ambulatory Visit (INDEPENDENT_AMBULATORY_CARE_PROVIDER_SITE_OTHER): Payer: Medicare Other

## 2015-11-11 ENCOUNTER — Ambulatory Visit (INDEPENDENT_AMBULATORY_CARE_PROVIDER_SITE_OTHER): Payer: Medicare Other | Admitting: Internal Medicine

## 2015-11-11 VITALS — BP 122/80 | HR 77 | Temp 98.3°F | Ht 61.0 in | Wt 103.0 lb

## 2015-11-11 DIAGNOSIS — E785 Hyperlipidemia, unspecified: Secondary | ICD-10-CM

## 2015-11-11 DIAGNOSIS — H8113 Benign paroxysmal vertigo, bilateral: Secondary | ICD-10-CM | POA: Insufficient documentation

## 2015-11-11 DIAGNOSIS — I10 Essential (primary) hypertension: Secondary | ICD-10-CM

## 2015-11-11 DIAGNOSIS — E2839 Other primary ovarian failure: Secondary | ICD-10-CM | POA: Diagnosis not present

## 2015-11-11 DIAGNOSIS — Z23 Encounter for immunization: Secondary | ICD-10-CM | POA: Diagnosis not present

## 2015-11-11 DIAGNOSIS — Z1231 Encounter for screening mammogram for malignant neoplasm of breast: Secondary | ICD-10-CM

## 2015-11-11 LAB — LIPID PANEL
CHOL/HDL RATIO: 3
Cholesterol: 207 mg/dL — ABNORMAL HIGH (ref 0–200)
HDL: 75.4 mg/dL (ref >39.00)
LDL CALC: 117 mg/dL — AB (ref 0–99)
NONHDL: 131.75
Triglycerides: 75 mg/dL (ref 0.0–149.0)
VLDL: 15 mg/dL (ref 0.0–40.0)

## 2015-11-11 LAB — COMPREHENSIVE METABOLIC PANEL
ALT: 15 U/L (ref 0–35)
AST: 22 U/L (ref 0–37)
Albumin: 4.5 g/dL (ref 3.5–5.2)
Alkaline Phosphatase: 52 U/L (ref 39–117)
BUN: 15 mg/dL (ref 6–23)
CHLORIDE: 100 meq/L (ref 96–112)
CO2: 33 meq/L — AB (ref 19–32)
Calcium: 10.1 mg/dL (ref 8.4–10.5)
Creatinine, Ser: 0.85 mg/dL (ref 0.40–1.20)
GFR: 68.63 mL/min (ref >60.00)
GLUCOSE: 104 mg/dL — AB (ref 70–99)
POTASSIUM: 4.4 meq/L (ref 3.5–5.1)
Sodium: 141 mEq/L (ref 135–145)
Total Bilirubin: 0.8 mg/dL (ref 0.2–1.2)
Total Protein: 7.1 g/dL (ref 6.0–8.3)

## 2015-11-11 LAB — CBC WITH DIFFERENTIAL/PLATELET
Basophils Absolute: 0 10*3/uL (ref 0.0–0.1)
Basophils Relative: 0.4 % (ref 0.0–3.0)
EOS PCT: 1 % (ref 0.0–5.0)
Eosinophils Absolute: 0 10*3/uL (ref 0.0–0.7)
HEMATOCRIT: 41.6 % (ref 36.0–46.0)
Hemoglobin: 14.1 g/dL (ref 12.0–15.0)
LYMPHS ABS: 1.2 10*3/uL (ref 0.7–4.0)
Lymphocytes Relative: 26 % (ref 12.0–46.0)
MCHC: 33.8 g/dL (ref 30.0–36.0)
MCV: 89.3 fl (ref 78.0–100.0)
MONOS PCT: 9.9 % (ref 3.0–12.0)
Monocytes Absolute: 0.5 10*3/uL (ref 0.1–1.0)
NEUTROS ABS: 2.9 10*3/uL (ref 1.4–7.7)
NEUTROS PCT: 62.7 % (ref 43.0–77.0)
PLATELETS: 247 10*3/uL (ref 150.0–400.0)
RBC: 4.66 Mil/uL (ref 3.87–5.11)
RDW: 12.6 % (ref 11.5–15.5)
WBC: 4.6 10*3/uL (ref 4.0–10.5)

## 2015-11-11 LAB — URINALYSIS, ROUTINE W REFLEX MICROSCOPIC
Bilirubin Urine: NEGATIVE
Hgb urine dipstick: NEGATIVE
Ketones, ur: NEGATIVE
Leukocytes, UA: NEGATIVE
Nitrite: NEGATIVE
RBC / HPF: NONE SEEN (ref 0–?)
SPECIFIC GRAVITY, URINE: 1.01 (ref 1.000–1.030)
Total Protein, Urine: NEGATIVE
Urine Glucose: NEGATIVE
Urobilinogen, UA: 0.2 (ref 0.0–1.0)
pH: 8.5 — AB (ref 5.0–8.0)

## 2015-11-11 LAB — TSH: TSH: 2.38 u[IU]/mL (ref 0.35–4.50)

## 2015-11-11 MED ORDER — ZOSTER VACCINE LIVE 19400 UNT/0.65ML ~~LOC~~ SUSR
0.6500 mL | Freq: Once | SUBCUTANEOUS | 0 refills | Status: AC
Start: 2015-11-11 — End: 2015-11-11

## 2015-11-11 MED ORDER — MECLIZINE HCL 25 MG PO TABS: 25.0000 mg | ORAL_TABLET | Freq: Three times a day (TID) | ORAL | 3 refills | Status: DC | PRN

## 2015-11-11 NOTE — Progress Notes (Signed)
Pre visit review using our clinic review tool, if applicable. No additional management support is needed unless otherwise documented below in the visit note. 

## 2015-11-11 NOTE — Progress Notes (Signed)
Subjective:  Patient ID: Vicki Garcia, female    DOB: 17-Jun-1935  Age: 80 y.o. MRN: [id number]  CC: Hypertension   HPI Vicki Garcia presents for a blood pressure check and follow-up on episodes of vertigo. For about 2 or 3 years now she has had rare but sudden onset of vertigo. She tells me the symptoms occur when she lays down in the bed and turns her head. The last episode was about a month ago. She was previously treated with meclizine and says it helps her symptoms quite a bit. She has had no recent episodes of headache, blurred vision, paresthesias, nausea, or vomiting.  Outpatient Medications Prior to Visit  Medication Sig Dispense Refill  . hydrocortisone (PROCTO-MED HC) 2.5 % rectal cream Place 1 application rectally 2 (two) times daily. 30 g 11  . latanoprost (XALATAN) 0.005 % ophthalmic solution Place 1 drop into both eyes at bedtime.     Marland Kitchen losartan-hydrochlorothiazide (HYZAAR) 100-12.5 MG tablet Take 1 tablet by mouth daily. Yearly physical due in Oct must see md for refills 90 tablet 0  . traMADol (ULTRAM) 50 MG tablet Take 1-2 tablets (50-100 mg total) by mouth every 6 (six) hours as needed. 30 tablet 0  . lidocaine (XYLOCAINE) 2 % solution Use as directed 15 mLs in the mouth or throat every 3 (three) hours as needed for mouth pain (gargle; may spit or swallow). 100 mL 0   No facility-administered medications prior to visit.     ROS Review of Systems  Constitutional: Negative.  Negative for appetite change, chills, diaphoresis, fatigue and fever.  HENT: Negative.  Negative for facial swelling, sinus pressure and trouble swallowing.   Eyes: Negative.  Negative for visual disturbance.  Respiratory: Negative.  Negative for cough, choking, chest tightness, shortness of breath and stridor.   Cardiovascular: Negative.  Negative for chest pain, palpitations and leg swelling.  Gastrointestinal: Negative for abdominal pain, blood in stool, constipation, diarrhea, nausea and vomiting.   Genitourinary: Negative.  Negative for difficulty urinating.  Musculoskeletal: Negative for arthralgias, back pain, joint swelling, myalgias and neck pain.  Skin: Negative.  Negative for color change and rash.  Neurological: Negative for dizziness, tremors, seizures, syncope, facial asymmetry, speech difficulty, weakness, light-headedness, numbness and headaches.  Hematological: Negative.  Negative for adenopathy. Does not bruise/bleed easily.  Psychiatric/Behavioral: Negative.  Negative for dysphoric mood and sleep disturbance. The patient is not nervous/anxious.     Objective:  BP 122/80 (BP Location: Left Arm, Patient Position: Sitting, Cuff Size: Normal)   Pulse 77   Temp 98.3 F (36.8 C) (Oral)   Ht 5\' 1"  (1.549 m)   Wt 103 lb (46.7 kg)   SpO2 94%   BMI 19.46 kg/m   BP Readings from Last 3 Encounters:  11/11/15 122/80  07/18/15 140/86  06/07/15 (!) 168/82    Wt Readings from Last 3 Encounters:  11/11/15 103 lb (46.7 kg)  07/18/15 106 lb (48.1 kg)  06/07/15 106 lb (48.1 kg)    Physical Exam  Constitutional: She is oriented to person, place, and time. No distress.  HENT:  Head: Normocephalic and atraumatic.  Right Ear: Hearing, tympanic membrane, external ear and ear canal normal.  Left Ear: Hearing, tympanic membrane, external ear and ear canal normal.  Mouth/Throat: Oropharynx is clear and moist. No oropharyngeal exudate.  Eyes: Conjunctivae and EOM are normal. Pupils are equal, round, and reactive to light. Right eye exhibits no discharge. Left eye exhibits no discharge. No scleral icterus.  Neck: Normal range  of motion. Neck supple. No JVD present. No tracheal deviation present. No thyromegaly present.  Cardiovascular: Normal rate, regular rhythm, normal heart sounds and intact distal pulses.  Exam reveals no gallop and no friction rub.   No murmur heard. Pulmonary/Chest: Effort normal and breath sounds normal. No stridor. No respiratory distress. She has no  wheezes. She has no rales. She exhibits no tenderness.  Abdominal: Soft. Bowel sounds are normal. She exhibits no distension and no mass. There is no tenderness. There is no rebound and no guarding.  Musculoskeletal: Normal range of motion. She exhibits no edema, tenderness or deformity.  Lymphadenopathy:    She has no cervical adenopathy.  Neurological: She is alert and oriented to person, place, and time. She has normal reflexes. She displays normal reflexes. No cranial nerve deficit. She exhibits normal muscle tone. Coordination normal.  Skin: Skin is warm and dry. No rash noted. She is not diaphoretic. No erythema. No pallor.  Psychiatric: She has a normal mood and affect. Her behavior is normal. Judgment and thought content normal.  Vitals reviewed.   Lab Results  Component Value Date   WBC 5.7 02/25/2015   HGB 13.3 04/18/2015   HCT 39.0 04/18/2015   PLT 305.0 02/25/2015   GLUCOSE 88 04/18/2015   CHOL 157 07/30/2014   TRIG 61.0 07/30/2014   HDL 70.10 07/30/2014   LDLCALC 75 07/30/2014   ALT 11 02/25/2015   AST 16 02/25/2015   NA 141 04/18/2015   K 3.5 04/18/2015   CL 98 02/25/2015   CREATININE 0.78 02/25/2015   BUN 19 02/25/2015   CO2 32 02/25/2015   TSH 2.11 02/25/2015   HGBA1C 5.8 02/25/2015    No results found.  Assessment & Plan:   Vicki Garcia was seen today for hypertension.  Diagnoses and all orders for this visit:  Visit for screening mammogram -     MM DIGITAL SCREENING BILATERAL; Future  Estrogen deficiency -     DG Bone Density; Future  BPPV (benign paroxysmal positional vertigo), bilateral- her symptoms are consistent with benign paroxysmal positional vertigo, she currently has no neurological symptoms and her neurologic exam is normal, will continued to treat these symptoms intermittently with meclizine. If this becomes a recurrent problem for her then I will refer her for vestibular rehabilitation -     meclizine (ANTIVERT) 25 MG tablet; Take 1 tablet (25  mg total) by mouth 3 (three) times daily as needed for dizziness.  Essential hypertension- her blood pressure is well-controlled, electrolytes and renal function are stable. -     Basic metabolic panel; Future  Other orders -     Zoster Vaccine Live, PF, (ZOSTAVAX) [id number] UNT/0.65ML injection; Inject 19,400 Units into the skin once.   I have discontinued Ms. Osier's lidocaine. I am also having her start on meclizine and Zoster Vaccine Live (PF). Additionally, I am having her maintain her latanoprost, traMADol, hydrocortisone, and losartan-hydrochlorothiazide.  Meds ordered this encounter  Medications  . meclizine (ANTIVERT) 25 MG tablet    Sig: Take 1 tablet (25 mg total) by mouth 3 (three) times daily as needed for dizziness.    Dispense:  30 tablet    Refill:  3  . Zoster Vaccine Live, PF, (ZOSTAVAX) [id number] UNT/0.65ML injection    Sig: Inject 19,400 Units into the skin once.    Dispense:  1 each    Refill:  0     Follow-up: No Follow-up on file.  Scarlette Calico, MD

## 2015-11-11 NOTE — Patient Instructions (Signed)

## 2015-11-30 ENCOUNTER — Other Ambulatory Visit: Payer: Self-pay | Admitting: Internal Medicine

## 2015-12-03 DIAGNOSIS — H40052 Ocular hypertension, left eye: Secondary | ICD-10-CM | POA: Diagnosis not present

## 2015-12-03 DIAGNOSIS — H40012 Open angle with borderline findings, low risk, left eye: Secondary | ICD-10-CM | POA: Diagnosis not present

## 2015-12-03 DIAGNOSIS — H40011 Open angle with borderline findings, low risk, right eye: Secondary | ICD-10-CM | POA: Diagnosis not present

## 2015-12-03 DIAGNOSIS — H40051 Ocular hypertension, right eye: Secondary | ICD-10-CM | POA: Diagnosis not present

## 2015-12-05 ENCOUNTER — Other Ambulatory Visit: Payer: Self-pay | Admitting: Internal Medicine

## 2015-12-05 DIAGNOSIS — Z1231 Encounter for screening mammogram for malignant neoplasm of breast: Secondary | ICD-10-CM

## 2016-02-12 DIAGNOSIS — L821 Other seborrheic keratosis: Secondary | ICD-10-CM | POA: Diagnosis not present

## 2016-02-12 DIAGNOSIS — D225 Melanocytic nevi of trunk: Secondary | ICD-10-CM | POA: Diagnosis not present

## 2016-02-12 DIAGNOSIS — Z85820 Personal history of malignant melanoma of skin: Secondary | ICD-10-CM | POA: Diagnosis not present

## 2016-03-06 ENCOUNTER — Ambulatory Visit (INDEPENDENT_AMBULATORY_CARE_PROVIDER_SITE_OTHER)
Admission: RE | Admit: 2016-03-06 | Discharge: 2016-03-06 | Disposition: A | Discharge status: Home / Self Care | Payer: Medicare Other | Source: Ambulatory Visit | Attending: Internal Medicine | Admitting: Internal Medicine

## 2016-03-06 ENCOUNTER — Encounter: Payer: Self-pay | Admitting: Internal Medicine

## 2016-03-06 ENCOUNTER — Ambulatory Visit (INDEPENDENT_AMBULATORY_CARE_PROVIDER_SITE_OTHER): Payer: Medicare Other | Admitting: Internal Medicine

## 2016-03-06 VITALS — BP 148/70 | HR 83 | Temp 98.2°F | Ht 61.0 in | Wt 103.5 lb

## 2016-03-06 DIAGNOSIS — R059 Cough, unspecified: Secondary | ICD-10-CM

## 2016-03-06 DIAGNOSIS — R05 Cough: Secondary | ICD-10-CM

## 2016-03-06 DIAGNOSIS — R053 Chronic cough: Secondary | ICD-10-CM | POA: Insufficient documentation

## 2016-03-06 NOTE — Progress Notes (Signed)
   Subjective:    Patient ID: Vicki Garcia, female    DOB: [birth year], 81 y.o.   MRN: [id number]  HPI The patient is a 81 YO female coming in for cough for about 6 weeks. She denies having a bad cold or flu during that time. Mostly the cough is intermittent and mild. Worse in the night time and morning. She has some nose drainage as well during this time. She has not tried anything for the cough in the 6 weeks. Gets slightly hoarse with talking a lot. No fevers or chills. Not SOB with exertion and maintaining her normal activities.   Review of Systems  Constitutional: Negative for activity change, appetite change, chills, fatigue, fever and unexpected weight change.  HENT: Positive for congestion and postnasal drip. Negative for ear discharge, ear pain, rhinorrhea, sinus pain, sinus pressure, sore throat and trouble swallowing.   Eyes: Negative.   Respiratory: Positive for cough. Negative for chest tightness, shortness of breath and wheezing.   Cardiovascular: Negative.   Gastrointestinal: Negative.   Musculoskeletal: Negative.       Objective:   Physical Exam  Constitutional: She appears well-developed and well-nourished.  HENT:  Head: Normocephalic and atraumatic.  Right Ear: External ear normal.  Left Ear: External ear normal.  Oropharynx with redness and clear drainage, nose without crusting, no sinus pressure.   Eyes: EOM are normal.  Neck: Normal range of motion.  Cardiovascular: Normal rate and regular rhythm.    Vitals:   03/06/16 0942  BP: (!) 148/70  Pulse: 83  Temp: 98.2 F (36.8 C)  TempSrc: Oral  SpO2: 99%  Weight: 103 lb 8 oz (46.9 kg)  Height: 5\' 1"  (1.549 m)      Assessment & Plan:

## 2016-03-06 NOTE — Assessment & Plan Note (Signed)
Suspect PND, checking CXR because of time course and no recent CXR. Advised to start taking zyrtec over the counter. Not on ACE-I.

## 2016-03-06 NOTE — Patient Instructions (Signed)
We will have you start taking the zyrtec (cetirizine) 1 pill a day for the next 1-2 weeks to clear the cough.   We are checking the chest x-ray today and will call you back with the results

## 2016-03-06 NOTE — Progress Notes (Signed)
Pre visit review using our clinic review tool, if applicable. No additional management support is needed unless otherwise documented below in the visit note. 

## 2016-05-05 ENCOUNTER — Encounter: Payer: Self-pay | Admitting: Internal Medicine

## 2016-05-05 ENCOUNTER — Ambulatory Visit (INDEPENDENT_AMBULATORY_CARE_PROVIDER_SITE_OTHER)
Admission: RE | Admit: 2016-05-05 | Discharge: 2016-05-05 | Disposition: A | Discharge status: Home / Self Care | Payer: Medicare Other | Source: Ambulatory Visit | Attending: Internal Medicine | Admitting: Internal Medicine

## 2016-05-05 ENCOUNTER — Ambulatory Visit (INDEPENDENT_AMBULATORY_CARE_PROVIDER_SITE_OTHER): Payer: Medicare Other | Admitting: Internal Medicine

## 2016-05-05 VITALS — BP 128/70 | HR 67 | Temp 98.2°F | Resp 16 | Ht 61.0 in | Wt 103.5 lb

## 2016-05-05 DIAGNOSIS — J069 Acute upper respiratory infection, unspecified: Secondary | ICD-10-CM

## 2016-05-05 DIAGNOSIS — R059 Cough, unspecified: Secondary | ICD-10-CM

## 2016-05-05 DIAGNOSIS — R05 Cough: Secondary | ICD-10-CM

## 2016-05-05 DIAGNOSIS — B9789 Other viral agents as the cause of diseases classified elsewhere: Secondary | ICD-10-CM | POA: Diagnosis not present

## 2016-05-05 DIAGNOSIS — F0390 Unspecified dementia without behavioral disturbance: Secondary | ICD-10-CM | POA: Diagnosis not present

## 2016-05-05 MED ORDER — PROMETHAZINE-DM 6.25-15 MG/5ML PO SYRP: 5.0000 mL | ORAL_SOLUTION | Freq: Four times a day (QID) | ORAL | 0 refills | Status: DC | PRN

## 2016-05-05 NOTE — Patient Instructions (Signed)
Cough, Adult Coughing is a reflex that clears your throat and your airways. Coughing helps to heal and protect your lungs. It is normal to cough occasionally, but a cough that happens with other symptoms or lasts a long time may be a sign of a condition that needs treatment. A cough may last only 2-3 weeks (acute), or it may last longer than 8 weeks (chronic). What are the causes? Coughing is commonly caused by:  Breathing in substances that irritate your lungs.  A viral or bacterial respiratory infection.  Allergies.  Asthma.  Postnasal drip.  Smoking.  Acid backing up from the stomach into the esophagus (gastroesophageal reflux).  Certain medicines.  Chronic lung problems, including COPD (or rarely, lung cancer).  Other medical conditions such as heart failure.  Follow these instructions at home: Pay attention to any changes in your symptoms. Take these actions to help with your discomfort:  Take medicines only as told by your health care provider. ? If you were prescribed an antibiotic medicine, take it as told by your health care provider. Do not stop taking the antibiotic even if you start to feel better. ? Talk with your health care provider before you take a cough suppressant medicine.  Drink enough fluid to keep your urine clear or pale yellow.  If the air is dry, use a cold steam vaporizer or humidifier in your bedroom or your home to help loosen secretions.  Avoid anything that causes you to cough at work or at home.  If your cough is worse at night, try sleeping in a semi-upright position.  Avoid cigarette smoke. If you smoke, quit smoking. If you need help quitting, ask your health care provider.  Avoid caffeine.  Avoid alcohol.  Rest as needed.  Contact a health care provider if:  You have new symptoms.  You cough up pus.  Your cough does not get better after 2-3 weeks, or your cough gets worse.  You cannot control your cough with suppressant  medicines and you are losing sleep.  You develop pain that is getting worse or pain that is not controlled with pain medicines.  You have a fever.  You have unexplained weight loss.  You have night sweats. Get help right away if:  You cough up blood.  You have difficulty breathing.  Your heartbeat is very fast. This information is not intended to replace advice given to you by your health care provider. Make sure you discuss any questions you have with your health care provider. Document Released: 07/04/2010 Document Revised: 06/13/2015 Document Reviewed: 03/14/2014 Elsevier Interactive Patient Education  2017 [employer]  

## 2016-05-05 NOTE — Progress Notes (Signed)
Pre visit review using our clinic review tool, if applicable. No additional management support is needed unless otherwise documented below in the visit note. 

## 2016-05-05 NOTE — Progress Notes (Signed)
Subjective:  Patient ID: Vicki Garcia, female    DOB: Feb 28, 1935  Age: 81 y.o. MRN: [id number]  CC: Cough   HPI Ernestene Coover presents for a 2 week history of nonproductive cough and sore throat. She denies fever, chills, shortness of breath, wheezing, night sweats, chest pain, or hemoptysis.  Her husband remains concerned about her memory and wants to see if there are any medications she can take to improve her memory. She previously tried [place] and Aricept but had side effects and refused to continue taking it.  Outpatient Medications Prior to Visit  Medication Sig Dispense Refill  . hydrocortisone (PROCTO-MED HC) 2.5 % rectal cream Place 1 application rectally 2 (two) times daily. 30 g 11  . latanoprost (XALATAN) 0.005 % ophthalmic solution Place 1 drop into both eyes at bedtime.     Marland Kitchen losartan-hydrochlorothiazide (HYZAAR) 100-12.5 MG tablet TAKE 1 TABLET ONCE DAILY. 90 tablet 1  . meclizine (ANTIVERT) 25 MG tablet Take 1 tablet (25 mg total) by mouth 3 (three) times daily as needed for dizziness. 30 tablet 3  . traMADol (ULTRAM) 50 MG tablet Take 1-2 tablets (50-100 mg total) by mouth every 6 (six) hours as needed. 30 tablet 0   No facility-administered medications prior to visit.     ROS Review of Systems  Constitutional: Negative for appetite change, chills, fatigue and fever.  HENT: Positive for sore throat. Negative for facial swelling, sinus pressure and trouble swallowing.   Eyes: Negative.   Respiratory: Positive for cough. Negative for chest tightness, shortness of breath and wheezing.   Cardiovascular: Negative.  Negative for chest pain, palpitations and leg swelling.  Gastrointestinal: Negative for abdominal pain, constipation, diarrhea, nausea and vomiting.  Endocrine: Negative.   Genitourinary: Negative.  Negative for difficulty urinating.  Musculoskeletal: Negative.  Negative for back pain and myalgias.  Skin: Negative.  Negative for color change and rash.    Allergic/Immunologic: Negative.   Neurological: Negative.   Hematological: Negative for adenopathy. Does not bruise/bleed easily.  Psychiatric/Behavioral: Positive for confusion and decreased concentration. Negative for agitation, behavioral problems and sleep disturbance. The patient is not nervous/anxious.     Objective:  BP 128/70 (BP Location: Left Arm, Patient Position: Sitting, Cuff Size: Normal)   Pulse 67   Temp 98.2 F (36.8 C) (Oral)   Resp 16   Ht 5\' 1"  (1.549 m)   Wt 103 lb 8 oz (46.9 kg)   SpO2 98%   BMI 19.56 kg/m   BP Readings from Last 3 Encounters:  05/05/16 128/70  03/06/16 (!) 148/70  11/11/15 122/80    Wt Readings from Last 3 Encounters:  05/05/16 103 lb 8 oz (46.9 kg)  03/06/16 103 lb 8 oz (46.9 kg)  11/11/15 103 lb (46.7 kg)    Physical Exam  Constitutional: She is oriented to person, place, and time.  Non-toxic appearance. She does not have a sickly appearance. She does not appear ill. No distress.  HENT:  Mouth/Throat: Oropharynx is clear and moist. No oropharyngeal exudate.  Eyes: Conjunctivae are normal. Right eye exhibits no discharge. Left eye exhibits no discharge. No scleral icterus.  Neck: Normal range of motion. Neck supple. No JVD present. No tracheal deviation present. No thyromegaly present.  Cardiovascular: Normal rate, regular rhythm, normal heart sounds and intact distal pulses.  Exam reveals no gallop and no friction rub.   No murmur heard. Pulmonary/Chest: Effort normal and breath sounds normal. No stridor. No respiratory distress. She has no wheezes. She has no rales. She  exhibits no tenderness.  Abdominal: Soft. Bowel sounds are normal. She exhibits no distension and no mass. There is no tenderness. There is no rebound and no guarding.  Musculoskeletal: Normal range of motion. She exhibits no edema, tenderness or deformity.  Lymphadenopathy:    She has no cervical adenopathy.  Neurological: She is oriented to person, place, and  time.  Skin: Skin is warm and dry. No rash noted. She is not diaphoretic. No erythema. No pallor.  Vitals reviewed.   Lab Results  Component Value Date   WBC 4.6 11/11/2015   HGB 14.1 11/11/2015   HCT 41.6 11/11/2015   PLT 247.0 11/11/2015   GLUCOSE 104 (H) 11/11/2015   CHOL 207 (H) 11/11/2015   TRIG 75.0 11/11/2015   HDL 75.40 11/11/2015   LDLCALC 117 (H) 11/11/2015   ALT 15 11/11/2015   AST 22 11/11/2015   NA 141 11/11/2015   K 4.4 11/11/2015   CL 100 11/11/2015   CREATININE 0.85 11/11/2015   BUN 15 11/11/2015   CO2 33 (H) 11/11/2015   TSH 2.38 11/11/2015   HGBA1C 5.8 02/25/2015    Dg Chest 2 View  Result Date: 03/06/2016 CLINICAL DATA:  Cough.  Nasal congestion.  Symptoms for 4 days. EXAM: CHEST  2 VIEW COMPARISON:  None. FINDINGS: The cardiac silhouette is normal in size. No mediastinal or hilar masses. No evidence of adenopathy. Lungs are mildly hyperexpanded. Mild prominence of the interstitial markings is noted at the peripheral lung bases. There is mild scarring at the lung apices. Lungs are otherwise clear with no evidence of pneumonia or pulmonary edema. No pleural effusion.  No pneumothorax. Skeletal structures are demineralized but intact. IMPRESSION: No active cardiopulmonary disease. Electronically Signed   By: Lajean Manes M.D.   On: 03/06/2016 10:16   Dg Chest 2 View  Result Date: 05/05/2016 CLINICAL DATA:  81 year old with current history of hypertension, presenting with a 7 day history of cough and sore throat. EXAM: CHEST  2 VIEW COMPARISON:  03/06/2016. FINDINGS: Cardiac silhouette normal in size, unchanged. Thoracic aorta mildly tortuous and atherosclerotic, unchanged. Hilar and mediastinal contours otherwise unremarkable. Mild central peribronchial thickening, more so than on the prior examination. Lungs otherwise clear. No localized airspace consolidation. No pleural effusions. No pneumothorax. Normal pulmonary vascularity. Thoracic dextroscoliosis and  thoracolumbar levoscoliosis with degenerative changes throughout the thoracic and visualized upper lumbar spine. Atherosclerosis involving the visualized upper abdominal aorta. IMPRESSION: 1. Mild changes of acute bronchitis and/or asthma without focal airspace pneumonia. 2. Thoracic and upper abdominal aortic atherosclerosis. Electronically Signed   By: Evangeline Dakin M.D.   On: 05/05/2016 09:35     Assessment & Plan:   Arria was seen today for cough.  Diagnoses and all orders for this visit:  Dementia arising in the senium and presenium -     Ambulatory referral to Neurology  Cough- chest x-ray is consistent with acute bronchitis, there is no mass, effusion, or infiltrate. -     DG Chest 2 View; Future  Viral URI with cough- antibiotics are not indicated, will offer symptom relief -     promethazine-dextromethorphan (PROMETHAZINE-DM) 6.25-15 MG/5ML syrup; Take 5 mLs by mouth 4 (four) times daily as needed for cough.   I am having Ms. Strahle start on promethazine-dextromethorphan. I am also having her maintain her latanoprost, traMADol, hydrocortisone, meclizine, and losartan-hydrochlorothiazide.  Meds ordered this encounter  Medications  . promethazine-dextromethorphan (PROMETHAZINE-DM) 6.25-15 MG/5ML syrup    Sig: Take 5 mLs by mouth 4 (four) times daily  as needed for cough.    Dispense:  118 mL    Refill:  0     Follow-up: Return in about 3 weeks (around 05/26/2016).  Scarlette Calico, MD

## 2016-05-06 ENCOUNTER — Encounter: Payer: Self-pay | Admitting: Neurology

## 2016-05-23 DIAGNOSIS — I10 Essential (primary) hypertension: Secondary | ICD-10-CM | POA: Diagnosis not present

## 2016-05-23 DIAGNOSIS — S299XXA Unspecified injury of thorax, initial encounter: Secondary | ICD-10-CM | POA: Diagnosis not present

## 2016-05-23 DIAGNOSIS — S3690XA Unspecified injury of unspecified intra-abdominal organ, initial encounter: Secondary | ICD-10-CM | POA: Diagnosis not present

## 2016-05-23 DIAGNOSIS — S20212A Contusion of left front wall of thorax, initial encounter: Secondary | ICD-10-CM | POA: Diagnosis not present

## 2016-05-23 DIAGNOSIS — R10819 Abdominal tenderness, unspecified site: Secondary | ICD-10-CM | POA: Diagnosis not present

## 2016-05-23 DIAGNOSIS — I771 Stricture of artery: Secondary | ICD-10-CM | POA: Diagnosis not present

## 2016-05-26 ENCOUNTER — Inpatient Hospital Stay: Admission: RE | Admit: 2016-05-26 | Payer: Medicare Other | Source: Ambulatory Visit

## 2016-05-26 ENCOUNTER — Encounter: Payer: Self-pay | Admitting: Internal Medicine

## 2016-05-26 ENCOUNTER — Ambulatory Visit (INDEPENDENT_AMBULATORY_CARE_PROVIDER_SITE_OTHER): Payer: Medicare Other | Admitting: Internal Medicine

## 2016-05-26 VITALS — BP 118/78 | HR 88 | Temp 97.8°F | Resp 16 | Ht 61.0 in | Wt 101.0 lb

## 2016-05-26 DIAGNOSIS — S20212S Contusion of left front wall of thorax, sequela: Secondary | ICD-10-CM | POA: Insufficient documentation

## 2016-05-26 DIAGNOSIS — R0789 Other chest pain: Secondary | ICD-10-CM | POA: Diagnosis not present

## 2016-05-26 MED ORDER — IBUPROFEN 600 MG PO TABS: 600.0000 mg | ORAL_TABLET | Freq: Three times a day (TID) | ORAL | 0 refills | Status: DC | PRN

## 2016-05-26 NOTE — Progress Notes (Signed)
Pre visit review using our clinic review tool, if applicable. No additional management support is needed unless otherwise documented below in the visit note. 

## 2016-05-26 NOTE — Progress Notes (Signed)
Subjective:  Patient ID: Vicki Garcia, female    DOB: [birth year]  Age: 81 y.o. MRN: [id number]  CC: Chest Pain   HPI Vicki Garcia presents for the complaint of pain over her left anterior chest wall after she sustained a fall 3 days ago while she was in [place]. She was seen at an ED there and she tells me that an xray of her chest was normal (no records are available). She was given flexeril for the pain and has tried tramadol as well but complains of persistent pain in the area when she takes a deep breath.  Outpatient Medications Prior to Visit  Medication Sig Dispense Refill  . hydrocortisone (PROCTO-MED HC) 2.5 % rectal cream Place 1 application rectally 2 (two) times daily. 30 g 11  . latanoprost (XALATAN) 0.005 % ophthalmic solution Place 1 drop into both eyes at bedtime.     Marland Kitchen losartan-hydrochlorothiazide (HYZAAR) 100-12.5 MG tablet TAKE 1 TABLET ONCE DAILY. 90 tablet 1  . meclizine (ANTIVERT) 25 MG tablet Take 1 tablet (25 mg total) by mouth 3 (three) times daily as needed for dizziness. 30 tablet 3  . traMADol (ULTRAM) 50 MG tablet Take 1-2 tablets (50-100 mg total) by mouth every 6 (six) hours as needed. 30 tablet 0  . promethazine-dextromethorphan (PROMETHAZINE-DM) 6.25-15 MG/5ML syrup Take 5 mLs by mouth 4 (four) times daily as needed for cough. 118 mL 0   No facility-administered medications prior to visit.     ROS Review of Systems  Constitutional: Negative.  Negative for chills, diaphoresis, fatigue and fever.  HENT: Negative.  Negative for sinus pressure and trouble swallowing.   Eyes: Negative for visual disturbance.  Respiratory: Negative for cough, chest tightness, shortness of breath, wheezing and stridor.   Cardiovascular: Positive for chest pain. Negative for palpitations and leg swelling.  Gastrointestinal: Negative for abdominal pain, constipation, diarrhea, nausea and vomiting.  Endocrine: Negative.   Genitourinary: Negative.  Negative for difficulty  urinating.  Musculoskeletal: Negative.  Negative for arthralgias, back pain, myalgias and neck pain.  Skin: Negative.  Negative for wound.  Allergic/Immunologic: Negative.   Neurological: Negative.  Negative for dizziness, syncope and weakness.  Hematological: Does not bruise/bleed easily.  Psychiatric/Behavioral: Negative.     Objective:  BP 118/78 (BP Location: Left Arm, Patient Position: Sitting, Cuff Size: Normal)   Pulse 88   Temp 97.8 F (36.6 C) (Oral)   Resp 16   Ht 5\' 1"  (1.549 m)   Wt 101 lb (45.8 kg)   SpO2 98%   BMI 19.08 kg/m   BP Readings from Last 3 Encounters:  05/26/16 118/78  05/05/16 128/70  03/06/16 (!) 148/70    Wt Readings from Last 3 Encounters:  05/26/16 101 lb (45.8 kg)  05/05/16 103 lb 8 oz (46.9 kg)  03/06/16 103 lb 8 oz (46.9 kg)    Physical Exam  Constitutional: She is oriented to person, place, and time.  Non-toxic appearance. She does not have a sickly appearance. She does not appear ill. No distress.  HENT:  Mouth/Throat: Oropharynx is clear and moist. No oropharyngeal exudate.  Eyes: Conjunctivae are normal. Right eye exhibits no discharge. Left eye exhibits no discharge. No scleral icterus.  Neck: Normal range of motion. Neck supple. No JVD present. No tracheal deviation present. No thyromegaly present.  Cardiovascular: Normal rate, regular rhythm, normal heart sounds and intact distal pulses.  Exam reveals no gallop and no friction rub.   No murmur heard. Pulmonary/Chest: Effort normal and breath sounds normal. No  accessory muscle usage or stridor. No tachypnea. No respiratory distress. She has no decreased breath sounds. She has no wheezes. She has no rhonchi. She has no rales. She exhibits tenderness and bony tenderness. She exhibits no crepitus, no edema, no deformity and no swelling.    Abdominal: Soft. Bowel sounds are normal. She exhibits no distension and no mass. There is no tenderness. There is no rebound and no guarding.    Musculoskeletal: Normal range of motion. She exhibits no edema, tenderness or deformity.  Lymphadenopathy:    She has no cervical adenopathy.  Neurological: She is oriented to person, place, and time.  Skin: Skin is warm and dry. No rash noted. She is not diaphoretic. No erythema.  Vitals reviewed.   Lab Results  Component Value Date   WBC 4.6 11/11/2015   HGB 14.1 11/11/2015   HCT 41.6 11/11/2015   PLT 247.0 11/11/2015   GLUCOSE 104 (H) 11/11/2015   CHOL 207 (H) 11/11/2015   TRIG 75.0 11/11/2015   HDL 75.40 11/11/2015   LDLCALC 117 (H) 11/11/2015   ALT 15 11/11/2015   AST 22 11/11/2015   NA 141 11/11/2015   K 4.4 11/11/2015   CL 100 11/11/2015   CREATININE 0.85 11/11/2015   BUN 15 11/11/2015   CO2 33 (H) 11/11/2015   TSH 2.38 11/11/2015   HGBA1C 5.8 02/25/2015    Dg Chest 2 View  Result Date: 05/05/2016 CLINICAL DATA:  81 year old with current history of hypertension, presenting with a 7 day history of cough and sore throat. EXAM: CHEST  2 VIEW COMPARISON:  03/06/2016. FINDINGS: Cardiac silhouette normal in size, unchanged. Thoracic aorta mildly tortuous and atherosclerotic, unchanged. Hilar and mediastinal contours otherwise unremarkable. Mild central peribronchial thickening, more so than on the prior examination. Lungs otherwise clear. No localized airspace consolidation. No pleural effusions. No pneumothorax. Normal pulmonary vascularity. Thoracic dextroscoliosis and thoracolumbar levoscoliosis with degenerative changes throughout the thoracic and visualized upper lumbar spine. Atherosclerosis involving the visualized upper abdominal aorta. IMPRESSION: 1. Mild changes of acute bronchitis and/or asthma without focal airspace pneumonia. 2. Thoracic and upper abdominal aortic atherosclerosis. Electronically Signed   By: Evangeline Dakin M.D.   On: 05/05/2016 09:35    Assessment & Plan:   Vicki Garcia was seen today for chest pain.  Diagnoses and all orders for this  visit:  Chest wall contusion, left, sequela- she was not willing to do another chest x-ray today, she assures me that the one done 3 days ago in [place] was normal, will add ibuprofen to her current regimen to control the pain. -     ibuprofen (ADVIL,MOTRIN) 600 MG tablet; Take 1 tablet (600 mg total) by mouth every 8 (eight) hours as needed.  Left-sided chest wall pain- Flexeril has not helped her much so asked her to stop taking it, she will continue tramadol as needed, will add an anti-inflammatory for additional symptom relief. -     ibuprofen (ADVIL,MOTRIN) 600 MG tablet; Take 1 tablet (600 mg total) by mouth every 8 (eight) hours as needed.   I have discontinued Ms. Dishman's promethazine-dextromethorphan. I am also having her start on ibuprofen. Additionally, I am having her maintain her latanoprost, traMADol, hydrocortisone, meclizine, and losartan-hydrochlorothiazide.  Meds ordered this encounter  Medications  . ibuprofen (ADVIL,MOTRIN) 600 MG tablet    Sig: Take 1 tablet (600 mg total) by mouth every 8 (eight) hours as needed.    Dispense:  30 tablet    Refill:  0     Follow-up: Return  if symptoms worsen or fail to improve.  Scarlette Calico, MD

## 2016-05-26 NOTE — Patient Instructions (Signed)
Chest Wall Pain °Chest wall pain is pain in or around the bones and muscles of your chest. Sometimes, an injury causes this pain. Sometimes, the cause may not be known. This pain may take several weeks or longer to get better. °Follow these instructions at home: °Pay attention to any changes in your symptoms. Take these actions to help with your pain: °· Rest as told by your health care provider. °· Avoid activities that cause pain. These include any activities that use your chest muscles or your abdominal and side muscles to lift heavy items. °· If directed, apply ice to the painful area: °¨ Put ice in a plastic bag. °¨ Place a towel between your skin and the bag. °¨ Leave the ice on for 20 minutes, 2-3 times per day. °· Take over-the-counter and prescription medicines only as told by your health care provider. °· Do not use tobacco products, including cigarettes, chewing tobacco, and e-cigarettes. If you need help quitting, ask your health care provider. °· Keep all follow-up visits as told by your health care provider. This is important. °Contact a health care provider if: °· You have a fever. °· Your chest pain becomes worse. °· You have new symptoms. °Get help right away if: °· You have nausea or vomiting. °· You feel sweaty or light-headed. °· You have a cough with phlegm (sputum) or you cough up blood. °· You develop shortness of breath. °This information is not intended to replace advice given to you by your health care provider. Make sure you discuss any questions you have with your health care provider. °Document Released: 01/05/2005 Document Revised: 05/16/2015 Document Reviewed: 04/02/2014 °Elsevier Interactive Patient Education © 2017 [employer] ° °

## 2016-06-30 ENCOUNTER — Ambulatory Visit: Payer: Medicare Other | Admitting: Neurology

## 2016-07-02 DIAGNOSIS — H903 Sensorineural hearing loss, bilateral: Secondary | ICD-10-CM | POA: Diagnosis not present

## 2016-07-02 DIAGNOSIS — H6123 Impacted cerumen, bilateral: Secondary | ICD-10-CM | POA: Diagnosis not present

## 2016-07-14 ENCOUNTER — Ambulatory Visit: Payer: Medicare Other | Admitting: Internal Medicine

## 2016-07-15 ENCOUNTER — Encounter: Payer: Self-pay | Admitting: Internal Medicine

## 2016-07-15 ENCOUNTER — Ambulatory Visit (INDEPENDENT_AMBULATORY_CARE_PROVIDER_SITE_OTHER): Payer: Medicare Other | Admitting: Internal Medicine

## 2016-07-15 VITALS — BP 136/84 | HR 64 | Ht 61.0 in | Wt 100.0 lb

## 2016-07-15 DIAGNOSIS — R42 Dizziness and giddiness: Secondary | ICD-10-CM | POA: Diagnosis not present

## 2016-07-15 DIAGNOSIS — M21612 Bunion of left foot: Secondary | ICD-10-CM | POA: Insufficient documentation

## 2016-07-15 DIAGNOSIS — J309 Allergic rhinitis, unspecified: Secondary | ICD-10-CM | POA: Diagnosis not present

## 2016-07-15 DIAGNOSIS — H8113 Benign paroxysmal vertigo, bilateral: Secondary | ICD-10-CM

## 2016-07-15 MED ORDER — PREDNISONE 10 MG PO TABS: ORAL_TABLET | ORAL | 0 refills | Status: DC

## 2016-07-15 MED ORDER — MECLIZINE HCL 25 MG PO TABS: 25.0000 mg | ORAL_TABLET | Freq: Three times a day (TID) | ORAL | 3 refills | Status: DC | PRN

## 2016-07-15 MED ORDER — CETIRIZINE HCL 10 MG PO TABS: 10.0000 mg | ORAL_TABLET | Freq: Every day | ORAL | 11 refills | Status: DC

## 2016-07-15 MED ORDER — TRIAMCINOLONE ACETONIDE 55 MCG/ACT NA AERO: 2.0000 | INHALATION_SPRAY | Freq: Every day | NASAL | 12 refills | Status: DC

## 2016-07-15 MED ORDER — METHYLPREDNISOLONE ACETATE 80 MG/ML IJ SUSP
80.0000 mg | Freq: Once | INTRAMUSCULAR | Status: AC
  Administered 2016-07-15: 80 mg via INTRAMUSCULAR

## 2016-07-15 NOTE — Patient Instructions (Signed)
You had the steroid shot today  Please take all new medication as prescribed - the prednisone for 5 days only, as well as zyrtec and nasacort for allergies  Please take all new medication as prescribed - the meclizine for dizziness  You will be contacted regarding the referral for: Podiatry  Please continue all other medications as before, and refills have been done if requested.  Please have the pharmacy call with any other refills you may need.  Please keep your appointments with your specialists as you may have planned

## 2016-07-15 NOTE — Progress Notes (Signed)
Subjective:    Patient ID: Vicki Garcia, female    DOB: 1935/04/07, 81 y.o.   MRN: [id number]  HPI  Here to f/u with husband; Does have several wks ongoing nasal allergy symptoms with clearish congestion, itch and sneezing, and cough with post nasal gtt, but without fever, pain, ST, swelling or wheezing. Also with vertigo on lying down or sitting up for several days, on falls  Also has recent 2 wks wiorsening Left foot bunion pain acute on chronic x 20 yrs, no falls.  Pt denies new neurological symptoms such as new headache, or facial or extremity weakness or numbness   Pt denies polydipsia, polyuria Past Medical History:  Diagnosis Date  . Disorder of left rotator cuff   . Diverticulosis of colon    AVM @ colonoscopy 07/2008  . GERD (gastroesophageal reflux disease)   . History of adenomatous polyp of colon    2010  . History of esophageal dilatation    07/ 2014  . History of esophagitis    07-/ 2014  . History of melanoma excision    1987  right upper arm  . Hyperlipidemia   . Hypertension   . Prolapsed internal hemorrhoids, grade 4   . Seasonal and perennial allergic rhinitis   . Tinnitus    chronic  . Wears glasses   . Wears hearing aid    bilateral   Past Surgical History:  Procedure Laterality Date  . COLONOSCOPY  last one 07-28-2012  . HEMORRHOID SURGERY N/A 04/18/2015   Procedure: HEMORRHOIDECTOMY;  Surgeon: Leighton Ruff, MD;  Location: [facility];  Service: General;  Laterality: N/A;  . LUMBAR FUSION  11-19-2006   L4 - L5  . UPPER GASTROINTESTINAL ENDOSCOPY  07-28-2012    reports that she quit smoking about 47 years ago. Her smoking use included Cigarettes. She quit after 10.00 years of use. She has never used smokeless tobacco. She reports that she drinks about 3.0 oz of alcohol per week . She reports that she does not use drugs. family history includes Breast cancer in her paternal aunt; Colon cancer in her maternal uncle; Coronary artery disease in  her father; Heart attack (age of onset: 10) in her paternal uncle; Hypertension in her brother; Kidney cancer in her brother; Lung cancer in her brother; Lymphoma in her mother; Prostate cancer in her brother; Skin cancer in her brother; Stomach cancer in her brother; Ulcers in her daughter. Allergies  Allergen Reactions  . Codeine Other (See Comments)    REACTION: violently ill with N&V  . Namzaric [Memantine Hcl-Donepezil Hcl] Nausea And Vomiting    Pt reports illness. .  Josem Kaufmann [Amlodipine Besylate] Nausea And Vomiting  . Vasotec Other (See Comments)    cough   Current Outpatient Prescriptions on File Prior to Visit  Medication Sig Dispense Refill  . hydrocortisone (PROCTO-MED HC) 2.5 % rectal cream Place 1 application rectally 2 (two) times daily. 30 g 11  . ibuprofen (ADVIL,MOTRIN) 600 MG tablet Take 1 tablet (600 mg total) by mouth every 8 (eight) hours as needed. 30 tablet 0  . latanoprost (XALATAN) 0.005 % ophthalmic solution Place 1 drop into both eyes at bedtime.     Marland Kitchen losartan-hydrochlorothiazide (HYZAAR) 100-12.5 MG tablet TAKE 1 TABLET ONCE DAILY. 90 tablet 1  . traMADol (ULTRAM) 50 MG tablet Take 1-2 tablets (50-100 mg total) by mouth every 6 (six) hours as needed. 30 tablet 0   No current facility-administered medications on file prior to visit.  Review of Systems  Constitutional: Negative for other unusual diaphoresis or sweats HENT: Negative for ear discharge or swelling Eyes: Negative for other worsening visual disturbances Respiratory: Negative for stridor or other swelling  Gastrointestinal: Negative for worsening distension or other blood Genitourinary: Negative for retention or other urinary change Musculoskeletal: Negative for other MSK pain or swelling Skin: Negative for color change or other new lesions Neurological: Negative for worsening tremors and other numbness  Psychiatric/Behavioral: Negative for worsening agitation or other fatigue All other  system neg per pt    Objective:   Physical Exam BP 136/84   Pulse 64   Ht 5\' 1"  (1.549 m)   Wt 100 lb (45.4 kg)   SpO2 98%   BMI 18.89 kg/m  VS noted,  Constitutional: Pt appears in NAD HENT: Head: NCAT.  Right Ear: External ear normal.  Left Ear: External ear normal.  Eyes: . Pupils are equal, round, and reactive to light. Conjunctivae and EOM are normal Nose: without d/c or deformity Neck: Neck supple. Gross normal ROM Cardiovascular: Normal rate and regular rhythm.   Pulmonary/Chest: Effort normal and breath sounds without rales or wheezing.  Bilat tm's with mild erythema.  Max sinus areas non tender.  Pharynx with mild erythema, no exudate Left foot first MTP with 2-3+ bony degnerative change, mil dtender  Neurological: Pt is alert. At baseline orientation, motor grossly intact, cn 2-12 intact Skin: Skin is warm. No rashes, other new lesions, no LE edema Psychiatric: Pt behavior is normal without agitation  No other exam findings    Assessment & Plan:

## 2016-07-18 NOTE — Assessment & Plan Note (Signed)
Mild but persistent, for meclizine prn,  to f/u any worsening symptoms or concerns

## 2016-07-18 NOTE — Assessment & Plan Note (Signed)
Mild to mod, for depomedrol IM 80, predpac asd, zyrtec prn, nasacort asd,  to f/u any worsening symptoms or concerns

## 2016-07-18 NOTE — Assessment & Plan Note (Signed)
Worsening symptomatic chronic issue, for podiatry referral

## 2016-08-13 ENCOUNTER — Other Ambulatory Visit: Payer: Self-pay | Admitting: Internal Medicine

## 2016-09-01 ENCOUNTER — Encounter: Payer: Self-pay | Admitting: Internal Medicine

## 2016-09-01 ENCOUNTER — Ambulatory Visit (INDEPENDENT_AMBULATORY_CARE_PROVIDER_SITE_OTHER): Payer: Medicare Other | Admitting: Internal Medicine

## 2016-09-01 VITALS — BP 124/80 | HR 63 | Temp 98.0°F | Resp 16 | Ht 61.0 in | Wt 98.0 lb

## 2016-09-01 DIAGNOSIS — B9789 Other viral agents as the cause of diseases classified elsewhere: Secondary | ICD-10-CM | POA: Diagnosis not present

## 2016-09-01 DIAGNOSIS — I10 Essential (primary) hypertension: Secondary | ICD-10-CM | POA: Diagnosis not present

## 2016-09-01 DIAGNOSIS — J069 Acute upper respiratory infection, unspecified: Secondary | ICD-10-CM | POA: Diagnosis not present

## 2016-09-01 MED ORDER — PROMETHAZINE-DM 6.25-15 MG/5ML PO SYRP: 5.0000 mL | ORAL_SOLUTION | Freq: Four times a day (QID) | ORAL | 0 refills | Status: DC | PRN

## 2016-09-01 NOTE — Progress Notes (Signed)
Subjective:  Patient ID: Vicki Garcia, female    DOB: December 03, 1935  Age: 81 y.o. MRN: [id number]  CC: URI   HPI Ynez Eugenio presents for a 3 day history of sore throat and nonproductive cough.  Outpatient Medications Prior to Visit  Medication Sig Dispense Refill  . losartan-hydrochlorothiazide (HYZAAR) 100-12.5 MG tablet TAKE 1 TABLET ONCE DAILY. 90 tablet 0  . latanoprost (XALATAN) 0.005 % ophthalmic solution Place 1 drop into both eyes at bedtime.     . cetirizine (ZYRTEC) 10 MG tablet Take 1 tablet (10 mg total) by mouth daily. (Patient not taking: Reported on 09/01/2016) 30 tablet 11  . hydrocortisone (PROCTO-MED HC) 2.5 % rectal cream Place 1 application rectally 2 (two) times daily. (Patient not taking: Reported on 09/01/2016) 30 g 11  . ibuprofen (ADVIL,MOTRIN) 600 MG tablet Take 1 tablet (600 mg total) by mouth every 8 (eight) hours as needed. (Patient not taking: Reported on 09/01/2016) 30 tablet 0  . meclizine (ANTIVERT) 25 MG tablet Take 1 tablet (25 mg total) by mouth 3 (three) times daily as needed for dizziness. (Patient not taking: Reported on 09/01/2016) 30 tablet 3  . predniSONE (DELTASONE) 10 MG tablet 2 tabs by mouth per day for 5 days (Patient not taking: Reported on 09/01/2016) 10 tablet 0  . traMADol (ULTRAM) 50 MG tablet Take 1-2 tablets (50-100 mg total) by mouth every 6 (six) hours as needed. (Patient not taking: Reported on 09/01/2016) 30 tablet 0  . triamcinolone (NASACORT) 55 MCG/ACT AERO nasal inhaler Place 2 sprays into the nose daily. (Patient not taking: Reported on 09/01/2016) 1 Inhaler 12   No facility-administered medications prior to visit.     ROS Review of Systems  Constitutional: Negative.  Negative for chills, diaphoresis, fatigue, fever and unexpected weight change.  HENT: Positive for sore throat. Negative for congestion, facial swelling, sinus pain, sinus pressure, trouble swallowing and voice change.   Eyes: Negative.   Respiratory: Positive for  cough. Negative for chest tightness, shortness of breath, wheezing and stridor.   Cardiovascular: Negative for chest pain, palpitations and leg swelling.  Gastrointestinal: Negative for abdominal pain, constipation, diarrhea, nausea and vomiting.  Endocrine: Negative.   Genitourinary: Negative.  Negative for difficulty urinating.  Musculoskeletal: Negative.  Negative for myalgias.  Skin: Negative.  Negative for color change and rash.  Allergic/Immunologic: Negative.   Neurological: Negative.  Negative for dizziness, weakness and light-headedness.  Hematological: Negative.  Negative for adenopathy. Does not bruise/bleed easily.  Psychiatric/Behavioral: Negative.     Objective:  BP 124/80 (BP Location: Left Arm, Patient Position: Sitting, Cuff Size: Normal)   Pulse 63   Temp 98 F (36.7 C) (Oral)   Resp 16   Ht 5\' 1"  (1.549 m)   Wt 98 lb (44.5 kg)   SpO2 99%   BMI 18.52 kg/m   BP Readings from Last 3 Encounters:  09/01/16 124/80  07/15/16 136/84  05/26/16 118/78    Wt Readings from Last 3 Encounters:  09/01/16 98 lb (44.5 kg)  07/15/16 100 lb (45.4 kg)  05/26/16 101 lb (45.8 kg)    Physical Exam  Constitutional: She is oriented to person, place, and time.  Non-toxic appearance. She does not have a sickly appearance. She does not appear ill. No distress.  HENT:  Mouth/Throat: Oropharynx is clear and moist and mucous membranes are normal. Mucous membranes are not pale, not dry and not cyanotic. No oral lesions. No trismus in the jaw. No uvula swelling. No oropharyngeal exudate or tonsillar  abscesses.  Eyes: Conjunctivae are normal. Right eye exhibits no discharge. Left eye exhibits no discharge. No scleral icterus.  Neck: Normal range of motion. Neck supple. No JVD present. No thyromegaly present.  Cardiovascular: Normal rate, regular rhythm and intact distal pulses.  Exam reveals no gallop and no friction rub.   No murmur heard. Pulmonary/Chest: Effort normal and breath  sounds normal. No respiratory distress. She has no wheezes. She has no rales. She exhibits no tenderness.  Abdominal: Soft. Bowel sounds are normal. She exhibits no distension and no mass. There is no tenderness. There is no rebound and no guarding.  Musculoskeletal: Normal range of motion. She exhibits no edema or tenderness.  Lymphadenopathy:       Head (right side): No submandibular and no occipital adenopathy present.       Head (left side): No submandibular and no occipital adenopathy present.    She has no cervical adenopathy.    She has no axillary adenopathy.       Right: No supraclavicular adenopathy present.       Left: No supraclavicular adenopathy present.  Neurological: She is alert and oriented to person, place, and time.  Skin: Skin is warm and dry. No rash noted. She is not diaphoretic. No erythema. No pallor.  Vitals reviewed.   Lab Results  Component Value Date   WBC 4.6 11/11/2015   HGB 14.1 11/11/2015   HCT 41.6 11/11/2015   PLT 247.0 11/11/2015   GLUCOSE 104 (H) 11/11/2015   CHOL 207 (H) 11/11/2015   TRIG 75.0 11/11/2015   HDL 75.40 11/11/2015   LDLCALC 117 (H) 11/11/2015   ALT 15 11/11/2015   AST 22 11/11/2015   NA 141 11/11/2015   K 4.4 11/11/2015   CL 100 11/11/2015   CREATININE 0.85 11/11/2015   BUN 15 11/11/2015   CO2 33 (H) 11/11/2015   TSH 2.38 11/11/2015   HGBA1C 5.8 02/25/2015    Dg Chest 2 View  Result Date: 05/05/2016 CLINICAL DATA:  81 year old with current history of hypertension, presenting with a 7 day history of cough and sore throat. EXAM: CHEST  2 VIEW COMPARISON:  03/06/2016. FINDINGS: Cardiac silhouette normal in size, unchanged. Thoracic aorta mildly tortuous and atherosclerotic, unchanged. Hilar and mediastinal contours otherwise unremarkable. Mild central peribronchial thickening, more so than on the prior examination. Lungs otherwise clear. No localized airspace consolidation. No pleural effusions. No pneumothorax. Normal pulmonary  vascularity. Thoracic dextroscoliosis and thoracolumbar levoscoliosis with degenerative changes throughout the thoracic and visualized upper lumbar spine. Atherosclerosis involving the visualized upper abdominal aorta. IMPRESSION: 1. Mild changes of acute bronchitis and/or asthma without focal airspace pneumonia. 2. Thoracic and upper abdominal aortic atherosclerosis. Electronically Signed   By: Evangeline Dakin M.D.   On: 05/05/2016 09:35    Assessment & Plan:   Cheryll was seen today for uri.  Diagnoses and all orders for this visit:  Viral upper respiratory tract infection with cough- this is a viral illness. Will offer symptomatic relief. -     promethazine-dextromethorphan (PROMETHAZINE-DM) 6.25-15 MG/5ML syrup; Take 5 mLs by mouth 4 (four) times daily as needed for cough.  Essential hypertension- her blood pressure is adequately well controlled.   I have discontinued Ms. Courville's traMADol, hydrocortisone, ibuprofen, cetirizine, triamcinolone, predniSONE, and meclizine. I am also having her start on promethazine-dextromethorphan. Additionally, I am having her maintain her latanoprost and losartan-hydrochlorothiazide.  Meds ordered this encounter  Medications  . promethazine-dextromethorphan (PROMETHAZINE-DM) 6.25-15 MG/5ML syrup    Sig: Take 5 mLs by mouth  4 (four) times daily as needed for cough.    Dispense:  118 mL    Refill:  0     Follow-up: Return if symptoms worsen or fail to improve.  Scarlette Calico, MD

## 2016-09-01 NOTE — Patient Instructions (Signed)
Upper Respiratory Infection, Adult Most upper respiratory infections (URIs) are caused by a virus. A URI affects the nose, throat, and upper air passages. The most common type of URI is often called "the common cold." Follow these instructions at home:  Take medicines only as told by your doctor.  Gargle warm saltwater or take cough drops to comfort your throat as told by your doctor.  Use a warm mist humidifier or inhale steam from a shower to increase air moisture. This may make it easier to breathe.  Drink enough fluid to keep your pee (urine) clear or pale yellow.  Eat soups and other clear broths.  Have a healthy diet.  Rest as needed.  Go back to work when your fever is gone or your doctor says it is okay. ? You may need to stay home longer to avoid giving your URI to others. ? You can also wear a face mask and wash your hands often to prevent spread of the virus.  Use your inhaler more if you have asthma.  Do not use any tobacco products, including cigarettes, chewing tobacco, or electronic cigarettes. If you need help quitting, ask your doctor. Contact a doctor if:  You are getting worse, not better.  Your symptoms are not helped by medicine.  You have chills.  You are getting more short of breath.  You have brown or red mucus.  You have yellow or brown discharge from your nose.  You have pain in your face, especially when you bend forward.  You have a fever.  You have puffy (swollen) neck glands.  You have pain while swallowing.  You have white areas in the back of your throat. Get help right away if:  You have very bad or constant: ? Headache. ? Ear pain. ? Pain in your forehead, behind your eyes, and over your cheekbones (sinus pain). ? Chest pain.  You have long-lasting (chronic) lung disease and any of the following: ? Wheezing. ? Long-lasting cough. ? Coughing up blood. ? A change in your usual mucus.  You have a stiff neck.  You have  changes in your: ? Vision. ? Hearing. ? Thinking. ? Mood. This information is not intended to replace advice given to you by your health care provider. Make sure you discuss any questions you have with your health care provider. Document Released: 06/24/2007 Document Revised: 09/08/2015 Document Reviewed: 04/12/2013 Elsevier Interactive Patient Education  2018 [employer] Inc[employer]  

## 2016-09-15 ENCOUNTER — Ambulatory Visit: Payer: Medicare Other | Admitting: Internal Medicine

## 2016-09-22 ENCOUNTER — Ambulatory Visit: Payer: Medicare Other | Admitting: Internal Medicine

## 2016-10-01 ENCOUNTER — Ambulatory Visit: Payer: Medicare Other | Admitting: Internal Medicine

## 2016-10-03 ENCOUNTER — Other Ambulatory Visit: Payer: Self-pay | Admitting: Internal Medicine

## 2016-10-03 DIAGNOSIS — B9789 Other viral agents as the cause of diseases classified elsewhere: Principal | ICD-10-CM

## 2016-10-03 DIAGNOSIS — J069 Acute upper respiratory infection, unspecified: Secondary | ICD-10-CM

## 2016-10-08 ENCOUNTER — Ambulatory Visit: Payer: Medicare Other | Admitting: Internal Medicine

## 2016-10-12 ENCOUNTER — Encounter: Payer: Self-pay | Admitting: Internal Medicine

## 2016-10-13 ENCOUNTER — Ambulatory Visit (INDEPENDENT_AMBULATORY_CARE_PROVIDER_SITE_OTHER): Payer: Medicare Other | Admitting: Internal Medicine

## 2016-10-13 ENCOUNTER — Encounter: Payer: Self-pay | Admitting: Internal Medicine

## 2016-10-13 ENCOUNTER — Ambulatory Visit (INDEPENDENT_AMBULATORY_CARE_PROVIDER_SITE_OTHER)
Admission: RE | Admit: 2016-10-13 | Discharge: 2016-10-13 | Disposition: A | Discharge status: Home / Self Care | Payer: Medicare Other | Source: Ambulatory Visit | Attending: Internal Medicine | Admitting: Internal Medicine

## 2016-10-13 VITALS — BP 140/78 | HR 74 | Temp 99.2°F | Resp 16 | Ht 61.0 in | Wt 96.0 lb

## 2016-10-13 DIAGNOSIS — J069 Acute upper respiratory infection, unspecified: Secondary | ICD-10-CM | POA: Diagnosis not present

## 2016-10-13 DIAGNOSIS — B9789 Other viral agents as the cause of diseases classified elsewhere: Secondary | ICD-10-CM

## 2016-10-13 DIAGNOSIS — R05 Cough: Secondary | ICD-10-CM | POA: Diagnosis not present

## 2016-10-13 DIAGNOSIS — F0390 Unspecified dementia without behavioral disturbance: Secondary | ICD-10-CM | POA: Diagnosis not present

## 2016-10-13 DIAGNOSIS — Z23 Encounter for immunization: Secondary | ICD-10-CM

## 2016-10-13 DIAGNOSIS — R059 Cough, unspecified: Secondary | ICD-10-CM

## 2016-10-13 NOTE — Patient Instructions (Signed)
Cough, Adult Coughing is a reflex that clears your throat and your airways. Coughing helps to heal and protect your lungs. It is normal to cough occasionally, but a cough that happens with other symptoms or lasts a long time may be a sign of a condition that needs treatment. A cough may last only 2-3 weeks (acute), or it may last longer than 8 weeks (chronic). What are the causes? Coughing is commonly caused by:  Breathing in substances that irritate your lungs.  A viral or bacterial respiratory infection.  Allergies.  Asthma.  Postnasal drip.  Smoking.  Acid backing up from the stomach into the esophagus (gastroesophageal reflux).  Certain medicines.  Chronic lung problems, including COPD (or rarely, lung cancer).  Other medical conditions such as heart failure.  Follow these instructions at home: Pay attention to any changes in your symptoms. Take these actions to help with your discomfort:  Take medicines only as told by your health care provider. ? If you were prescribed an antibiotic medicine, take it as told by your health care provider. Do not stop taking the antibiotic even if you start to feel better. ? Talk with your health care provider before you take a cough suppressant medicine.  Drink enough fluid to keep your urine clear or pale yellow.  If the air is dry, use a cold steam vaporizer or humidifier in your bedroom or your home to help loosen secretions.  Avoid anything that causes you to cough at work or at home.  If your cough is worse at night, try sleeping in a semi-upright position.  Avoid cigarette smoke. If you smoke, quit smoking. If you need help quitting, ask your health care provider.  Avoid caffeine.  Avoid alcohol.  Rest as needed.  Contact a health care provider if:  You have new symptoms.  You cough up pus.  Your cough does not get better after 2-3 weeks, or your cough gets worse.  You cannot control your cough with suppressant  medicines and you are losing sleep.  You develop pain that is getting worse or pain that is not controlled with pain medicines.  You have a fever.  You have unexplained weight loss.  You have night sweats. Get help right away if:  You cough up blood.  You have difficulty breathing.  Your heartbeat is very fast. This information is not intended to replace advice given to you by your health care provider. Make sure you discuss any questions you have with your health care provider. Document Released: 07/04/2010 Document Revised: 06/13/2015 Document Reviewed: 03/14/2014 Elsevier Interactive Patient Education  2017 [employer]  

## 2016-10-13 NOTE — Progress Notes (Signed)
Subjective:  Patient ID: Vicki Garcia, female    DOB: 18-Oct-1935  Age: 81 y.o. MRN: [id number]  CC: Cough   HPI Vicki Garcia presents for a several week history of sore throat, nonproductive cough, runny nose, and nasal congestion. Her family continues to be concerned about her memory loss and wants her to see a neurologist. She was referred to neurologist about 6 months ago but she refused to go be seen.  Outpatient Medications Prior to Visit  Medication Sig Dispense Refill  . losartan-hydrochlorothiazide (HYZAAR) 100-12.5 MG tablet TAKE 1 TABLET ONCE DAILY. 90 tablet 0  . latanoprost (XALATAN) 0.005 % ophthalmic solution Place 1 drop into both eyes at bedtime.     . promethazine-dextromethorphan (PROMETHAZINE-DM) 6.25-15 MG/5ML syrup Take 5 mLs by mouth 4 (four) times daily as needed for cough. 118 mL 0   No facility-administered medications prior to visit.     ROS Review of Systems  Constitutional: Negative.  Negative for appetite change, chills, diaphoresis, fatigue and fever.  HENT: Positive for sore throat. Negative for congestion, facial swelling, rhinorrhea, sinus pain, sinus pressure, trouble swallowing and voice change.   Eyes: Negative.   Respiratory: Positive for cough. Negative for chest tightness, shortness of breath and wheezing.   Cardiovascular: Negative for chest pain, palpitations and leg swelling.  Gastrointestinal: Negative for abdominal pain, constipation, diarrhea, nausea and vomiting.  Endocrine: Negative.   Genitourinary: Negative.  Negative for difficulty urinating.  Musculoskeletal: Negative.  Negative for myalgias.  Skin: Negative.  Negative for color change and rash.  Allergic/Immunologic: Negative.   Neurological: Negative.  Negative for dizziness.  Hematological: Negative for adenopathy. Does not bruise/bleed easily.  Psychiatric/Behavioral: Positive for confusion and decreased concentration. Negative for dysphoric mood, sleep disturbance and suicidal  ideas. The patient is not nervous/anxious.     Objective:  BP 140/78 (BP Location: Left Arm, Patient Position: Sitting, Cuff Size: Normal)   Pulse 74   Temp 99.2 F (37.3 C) (Oral)   Resp 16   Ht 5\' 1"  (1.549 m)   Wt 96 lb (43.5 kg)   SpO2 99%   BMI 18.14 kg/m   BP Readings from Last 3 Encounters:  10/13/16 140/78  09/01/16 124/80  07/15/16 136/84    Wt Readings from Last 3 Encounters:  10/13/16 96 lb (43.5 kg)  09/01/16 98 lb (44.5 kg)  07/15/16 100 lb (45.4 kg)    Physical Exam  Constitutional:  Non-toxic appearance. She does not have a sickly appearance. She does not appear ill. No distress.  HENT:  Nose: No mucosal edema or rhinorrhea. Right sinus exhibits no maxillary sinus tenderness and no frontal sinus tenderness. Left sinus exhibits no maxillary sinus tenderness and no frontal sinus tenderness.  Mouth/Throat: Oropharynx is clear and moist and mucous membranes are normal. Mucous membranes are not pale and not cyanotic. No oral lesions. No trismus in the jaw. No uvula swelling. No oropharyngeal exudate, posterior oropharyngeal edema, posterior oropharyngeal erythema or tonsillar abscesses.  Eyes: Conjunctivae are normal. Right eye exhibits no discharge. Left eye exhibits no discharge. No scleral icterus.  Neck: Normal range of motion. Neck supple. No JVD present. No thyromegaly present.  Cardiovascular: Normal rate, regular rhythm and intact distal pulses.  Exam reveals no gallop and no friction rub.   No murmur heard. Pulmonary/Chest: Effort normal and breath sounds normal. No respiratory distress. She has no wheezes. She has no rales. She exhibits no tenderness.  Abdominal: Soft. Bowel sounds are normal. She exhibits no distension and no mass.  There is no tenderness. There is no rebound and no guarding.  Musculoskeletal: Normal range of motion. She exhibits no edema, tenderness or deformity.  Lymphadenopathy:    She has no cervical adenopathy.  Neurological: She is  alert.  Skin: Skin is warm and dry. No rash noted. She is not diaphoretic. No erythema. No pallor.  Vitals reviewed.   Lab Results  Component Value Date   WBC 4.6 11/11/2015   HGB 14.1 11/11/2015   HCT 41.6 11/11/2015   PLT 247.0 11/11/2015   GLUCOSE 104 (H) 11/11/2015   CHOL 207 (H) 11/11/2015   TRIG 75.0 11/11/2015   HDL 75.40 11/11/2015   LDLCALC 117 (H) 11/11/2015   ALT 15 11/11/2015   AST 22 11/11/2015   NA 141 11/11/2015   K 4.4 11/11/2015   CL 100 11/11/2015   CREATININE 0.85 11/11/2015   BUN 15 11/11/2015   CO2 33 (H) 11/11/2015   TSH 2.38 11/11/2015   HGBA1C 5.8 02/25/2015    Dg Chest 2 View  Result Date: 05/05/2016 CLINICAL DATA:  81 year old with current history of hypertension, presenting with a 7 day history of cough and sore throat. EXAM: CHEST  2 VIEW COMPARISON:  03/06/2016. FINDINGS: Cardiac silhouette normal in size, unchanged. Thoracic aorta mildly tortuous and atherosclerotic, unchanged. Hilar and mediastinal contours otherwise unremarkable. Mild central peribronchial thickening, more so than on the prior examination. Lungs otherwise clear. No localized airspace consolidation. No pleural effusions. No pneumothorax. Normal pulmonary vascularity. Thoracic dextroscoliosis and thoracolumbar levoscoliosis with degenerative changes throughout the thoracic and visualized upper lumbar spine. Atherosclerosis involving the visualized upper abdominal aorta. IMPRESSION: 1. Mild changes of acute bronchitis and/or asthma without focal airspace pneumonia. 2. Thoracic and upper abdominal aortic atherosclerosis. Electronically Signed   By: Evangeline Dakin M.D.   On: 05/05/2016 09:35    Assessment & Plan:   Vicki Garcia was seen today for cough.  Diagnoses and all orders for this visit:  Dementia arising in the senium and presenium- will refer to neurology again -     Ambulatory referral to Neurology  Cough- her CXR is normal -     DG Chest 2 View; Future  Need for  prophylactic vaccination against Streptococcus pneumoniae (pneumococcus) -     Pneumococcal conjugate vaccine 13-valent  Need for influenza vaccination -     Flu vaccine HIGH DOSE PF (Fluzone High dose)  Viral URI with cough- her sx's and exam are c/w viral causes, antibiotics are not indicated   I have discontinued Vicki Garcia's latanoprost and promethazine-dextromethorphan. I am also having her maintain her losartan-hydrochlorothiazide.  No orders of the defined types were placed in this encounter.    Follow-up: Return in about 4 weeks (around 11/10/2016).  Scarlette Calico, MD

## 2016-10-14 ENCOUNTER — Encounter: Payer: Self-pay | Admitting: Neurology

## 2016-10-14 ENCOUNTER — Encounter: Payer: Self-pay | Admitting: Internal Medicine

## 2016-10-26 ENCOUNTER — Encounter: Payer: Self-pay | Admitting: Podiatry

## 2016-11-12 NOTE — Progress Notes (Signed)
This encounter was created in error - please disregard.

## 2016-11-25 ENCOUNTER — Encounter: Payer: Self-pay | Admitting: Internal Medicine

## 2016-11-25 ENCOUNTER — Other Ambulatory Visit: Payer: Self-pay | Admitting: Internal Medicine

## 2016-11-25 ENCOUNTER — Ambulatory Visit: Payer: Medicare Other | Admitting: Internal Medicine

## 2016-11-25 VITALS — BP 162/84 | HR 87 | Temp 98.3°F | Resp 16 | Wt 100.0 lb

## 2016-11-25 DIAGNOSIS — R05 Cough: Secondary | ICD-10-CM | POA: Diagnosis not present

## 2016-11-25 DIAGNOSIS — R059 Cough, unspecified: Secondary | ICD-10-CM

## 2016-11-25 NOTE — Assessment & Plan Note (Addendum)
She does not feel it is related to the losartan. She denies GERD History somewhat unreliable Possibly allergies, which she has been diagnosed with in the past Start claritin 10 mg daily -  If cough persists we may need to change the losartan  Follow up if no improvement

## 2016-11-25 NOTE — Progress Notes (Signed)
Subjective:    Patient ID: Vicki Garcia, female    DOB: 05/04/1935, 81 y.o.   MRN: [id number]  HPI She is here for an acute visit.  Her husband is with her today.   She has some dementia so the history is unreliable.  She has had a intermittent dry cough for months.  She has been seen for this several times.  The cough has been worse in the past two weeks ago.  She also has a sore throat, which also started about two weeks ago.    She denies SOB, wheeze, fever, nasal congestion, PND, GERD and headaches.      She is not taking anything for the cough.    Medications and allergies reviewed with patient and updated if appropriate.  Patient Active Problem List   Diagnosis Date Noted  . Viral URI with cough 05/05/2016  . Cough 03/06/2016  . Visit for screening mammogram 11/11/2015  . Estrogen deficiency 11/11/2015  . Hemorrhoids, internal 06/06/2015  . Dementia arising in the senium and presenium 10/24/2014  . Benign esophageal stricture 07/30/2014  . Other abnormal glucose 07/02/2013  . Insomnia 11/02/2012  . Seasonal and perennial allergic rhinitis 10/29/2008  . MELANOMA, UPPER ARM 04/04/2008  . Essential hypertension 02/22/2007  . Hyperlipidemia LDL goal <160 02/18/2007    Current Outpatient Medications on File Prior to Visit  Medication Sig Dispense Refill  . losartan-hydrochlorothiazide (HYZAAR) 100-12.5 MG tablet TAKE 1 TABLET ONCE DAILY. 90 tablet 0   No current facility-administered medications on file prior to visit.     Past Medical History:  Diagnosis Date  . Disorder of left rotator cuff   . Diverticulosis of colon    AVM @ colonoscopy 07/2008  . GERD (gastroesophageal reflux disease)   . History of adenomatous polyp of colon    2010  . History of esophageal dilatation    07/ 2014  . History of esophagitis    07-/ 2014  . History of melanoma excision    1987  right upper arm  . Hyperlipidemia   . Hypertension   . Prolapsed internal hemorrhoids, grade 4    . Seasonal and perennial allergic rhinitis   . Tinnitus    chronic  . Wears glasses   . Wears hearing aid    bilateral    Past Surgical History:  Procedure Laterality Date  . COLONOSCOPY  last one 07-28-2012  . LUMBAR FUSION  11-19-2006   L4 - L5  . UPPER GASTROINTESTINAL ENDOSCOPY  07-28-2012    Social History   Socioeconomic History  . Marital status: Married    Spouse name: None  . Number of children: None  . Years of education: None  . Highest education level: None  Social Needs  . Financial resource strain: None  . Food insecurity - worry: None  . Food insecurity - inability: None  . Transportation needs - medical: None  . Transportation needs - non-medical: None  Occupational History  . None  Tobacco Use  . Smoking status: Former Smoker    Years: 10.00    Types: Cigarettes    Last attempt to quit: 01/19/1969    Years since quitting: 47.8  . Smokeless tobacco: Never Used  Substance and Sexual Activity  . Alcohol use: Yes    Alcohol/week: 3.0 oz    Types: 5 Glasses of wine per week    Comment: occasional  . Drug use: No  . Sexual activity: None    Comment: G1  P1  M1  Other Topics Concern  . 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 Coronary artery disease Father   . Hypertension Brother   . Skin cancer Brother        also spinal stenosis  . Kidney cancer Brother   . Stomach cancer Brother   . Prostate cancer Brother   . Breast cancer Paternal Aunt   . Heart attack Paternal Uncle 45  . Lymphoma Mother        NHL  . Hemolytic uremic syndrome Unknown        granddaughter  . Lung cancer Brother        liver mets  . Colon cancer Maternal Uncle   . Ulcers Daughter   . Stroke Neg Hx     Review of Systems  Constitutional: Negative for chills and fever.  HENT: Positive for sore throat. Negative for congestion, ear pain, postnasal drip  and sinus pressure.   Respiratory: Positive for cough (dry). Negative for shortness of breath and wheezing.   Gastrointestinal: Negative for nausea.       No gerd  Neurological: Negative for headaches.       Objective:   Vitals:   11/25/16 1458  BP: (!) 162/84  Pulse: 87  Resp: 16  Temp: 98.3 F (36.8 C)  SpO2: 99%   Filed Weights   11/25/16 1458  Weight: 100 lb (45.4 kg)   Body mass index is 18.89 kg/m.  Wt Readings from Last 3 Encounters:  11/25/16 100 lb (45.4 kg)  10/13/16 96 lb (43.5 kg)  09/01/16 98 lb (44.5 kg)     Physical Exam GENERAL APPEARANCE: Appears stated age, well appearing, NAD EYES: conjunctiva clear, no icterus HEENT: bilateral tympanic membranes and ear canals normal, oropharynx with no erythema, no thyromegaly, trachea midline, no cervical or supraclavicular lymphadenopathy LUNGS: Clear to auscultation without wheeze or crackles, unlabored breathing, good air entry bilaterally HEART: Normal S1,S2 without murmurs EXTREMITIES: Without clubbing, cyanosis, or edema        Assessment & Plan:   See Problem List for Assessment and Plan of chronic medical problems.

## 2016-11-25 NOTE — Patient Instructions (Signed)
Start claritin daily.    If the cough does not improve we can consider changing your blood pressure medication.  Follow up if the cough does not improve.     Allergic Rhinitis Allergic rhinitis is when the mucous membranes in the nose respond to allergens. Allergens are particles in the air that cause your body to have an allergic reaction. This causes you to release allergic antibodies. Through a chain of events, these eventually cause you to release histamine into the blood stream. Although meant to protect the body, it is this release of histamine that causes your discomfort, such as frequent sneezing, congestion, and an itchy, runny nose. What are the causes? Seasonal allergic rhinitis (hay fever) is caused by pollen allergens that may come from grasses, trees, and weeds. Year-round allergic rhinitis (perennial allergic rhinitis) is caused by allergens such as house dust mites, pet dander, and mold spores. What are the signs or symptoms?  Nasal stuffiness (congestion).  Itchy, runny nose with sneezing and tearing of the eyes. How is this diagnosed? Your health care provider can help you determine the allergen or allergens that trigger your symptoms. If you and your health care provider are unable to determine the allergen, skin or blood testing may be used. Your health care provider will diagnose your condition after taking your health history and performing a physical exam. Your health care provider may assess you for other related conditions, such as asthma, pink eye, or an ear infection. How is this treated? Allergic rhinitis does not have a cure, but it can be controlled by:  Medicines that block allergy symptoms. These may include allergy shots, nasal sprays, and oral antihistamines.  Avoiding the allergen.  Hay fever may often be treated with antihistamines in pill or nasal spray forms. Antihistamines block the effects of histamine. There are over-the-counter medicines that may  help with nasal congestion and swelling around the eyes. Check with your health care provider before taking or giving this medicine. If avoiding the allergen or the medicine prescribed do not work, there are many new medicines your health care provider can prescribe. Stronger medicine may be used if initial measures are ineffective. Desensitizing injections can be used if medicine and avoidance does not work. Desensitization is when a patient is given ongoing shots until the body becomes less sensitive to the allergen. Make sure you follow up with your health care provider if problems continue. Follow these instructions at home: It is not possible to completely avoid allergens, but you can reduce your symptoms by taking steps to limit your exposure to them. It helps to know exactly what you are allergic to so that you can avoid your specific triggers. Contact a health care provider if:  You have a fever.  You develop a cough that does not stop easily (persistent).  You have shortness of breath.  You start wheezing.  Symptoms interfere with normal daily activities. This information is not intended to replace advice given to you by your health care provider. Make sure you discuss any questions you have with your health care provider. Document Released: 09/30/2000 Document Revised: 09/06/2015 Document Reviewed: 09/12/2012 Elsevier Interactive Patient Education  2017 [employer].

## 2016-12-07 DIAGNOSIS — H524 Presbyopia: Secondary | ICD-10-CM | POA: Diagnosis not present

## 2016-12-23 ENCOUNTER — Ambulatory Visit: Payer: Medicare Other | Admitting: Internal Medicine

## 2016-12-23 ENCOUNTER — Encounter: Payer: Self-pay | Admitting: Internal Medicine

## 2016-12-23 VITALS — BP 140/80 | HR 66 | Temp 97.7°F | Resp 16 | Ht 61.0 in | Wt 96.5 lb

## 2016-12-23 DIAGNOSIS — F0391 Unspecified dementia with behavioral disturbance: Secondary | ICD-10-CM

## 2016-12-23 DIAGNOSIS — I10 Essential (primary) hypertension: Secondary | ICD-10-CM | POA: Diagnosis not present

## 2016-12-23 DIAGNOSIS — F03918 Unspecified dementia, unspecified severity, with other behavioral disturbance: Secondary | ICD-10-CM

## 2016-12-23 MED ORDER — CITALOPRAM HYDROBROMIDE 10 MG PO TABS: 10.0000 mg | ORAL_TABLET | Freq: Every day | ORAL | 1 refills | Status: DC

## 2016-12-23 NOTE — Progress Notes (Signed)
Subjective:  Patient ID: Vicki Garcia, female    DOB: [birth year]  Age: 81 y.o. MRN: [id number]  CC: Hypertension   HPI Ansley Mangiapane presents for a BP check - her husband is with her today and tells me that he is concerned about her thoughts and behavior.  When I last saw her we were concerned about dementia so I referred her to neurology.  He tells me she has not seen a neurologist yet because she called and canceled the appointment.  She does not think she needs any help.  He has noticed that she is sundowning.  At dusk she starts complaining that she is in the wrong house and she needs to go home and she tells him that he is not her husband anymore and she needs to find her husband.  He is very upset about this.  Outpatient Medications Prior to Visit  Medication Sig Dispense Refill  . losartan-hydrochlorothiazide (HYZAAR) 100-12.5 MG tablet TAKE 1 TABLET ONCE DAILY. 90 tablet 1   No facility-administered medications prior to visit.     ROS Review of Systems  Constitutional: Negative.  Negative for diaphoresis and fatigue.  HENT: Negative.   Eyes: Negative.   Respiratory: Negative for cough, chest tightness, shortness of breath and wheezing.   Cardiovascular: Negative for chest pain.  Gastrointestinal: Negative for abdominal pain, constipation, diarrhea and vomiting.  Endocrine: Negative.   Genitourinary: Negative.   Musculoskeletal: Negative.   Skin: Negative.   Allergic/Immunologic: Negative.   Neurological: Negative.  Negative for dizziness, syncope and weakness.  Hematological: Negative for adenopathy. Does not bruise/bleed easily.  Psychiatric/Behavioral: Positive for behavioral problems, confusion and decreased concentration. Negative for dysphoric mood, sleep disturbance and suicidal ideas. The patient is not nervous/anxious.     Objective:  BP 140/80 (BP Location: Left Arm, Patient Position: Sitting, Cuff Size: Normal)   Pulse 66   Temp 97.7 F (36.5 C) (Oral)   Resp  16   Ht 5\' 1"  (1.549 m)   Wt 96 lb 8 oz (43.8 kg)   SpO2 97%   BMI 18.23 kg/m   BP Readings from Last 3 Encounters:  12/23/16 140/80  11/25/16 (!) 162/84  10/13/16 140/78    Wt Readings from Last 3 Encounters:  12/23/16 96 lb 8 oz (43.8 kg)  11/25/16 100 lb (45.4 kg)  10/13/16 96 lb (43.5 kg)    Physical Exam  Constitutional: She is oriented to person, place, and time. No distress.  HENT:  Mouth/Throat: Oropharynx is clear and moist. No oropharyngeal exudate.  Eyes: Conjunctivae are normal. No scleral icterus.  Neck: Normal range of motion. Neck supple. No thyromegaly present.  Cardiovascular: Normal rate, regular rhythm and normal heart sounds.  No murmur heard. Pulmonary/Chest: Effort normal and breath sounds normal. No respiratory distress. She has no wheezes.  Abdominal: Soft. Bowel sounds are normal. She exhibits no mass. There is no tenderness. There is no guarding.  Musculoskeletal: Normal range of motion. She exhibits no edema, tenderness or deformity.  Lymphadenopathy:    She has no cervical adenopathy.  Neurological: She is alert and oriented to person, place, and time.  Skin: Skin is warm and dry. No rash noted. She is not diaphoretic. No erythema.  Psychiatric: Judgment normal. Her mood appears anxious. Her affect is not angry. Her speech is tangential. Her speech is not rapid and/or pressured and not delayed. She is not agitated, not aggressive, not hyperactive, not slowed, not withdrawn, not actively hallucinating and not combative. Thought content is not  paranoid. Cognition and memory are impaired. She does not exhibit a depressed mood. She expresses no homicidal and no suicidal ideation. She exhibits abnormal recent memory and abnormal remote memory.  She was wrong on the date, month, and year. She was aware that it is Christmas time. She could not recall who the president is. She is inattentive.  Vitals reviewed.   Lab Results  Component Value Date   WBC  4.6 11/11/2015   HGB 14.1 11/11/2015   HCT 41.6 11/11/2015   PLT 247.0 11/11/2015   GLUCOSE 104 (H) 11/11/2015   CHOL 207 (H) 11/11/2015   TRIG 75.0 11/11/2015   HDL 75.40 11/11/2015   LDLCALC 117 (H) 11/11/2015   ALT 15 11/11/2015   AST 22 11/11/2015   NA 141 11/11/2015   K 4.4 11/11/2015   CL 100 11/11/2015   CREATININE 0.85 11/11/2015   BUN 15 11/11/2015   CO2 33 (H) 11/11/2015   TSH 2.38 11/11/2015   HGBA1C 5.8 02/25/2015    Dg Chest 2 View  Result Date: 10/14/2016 CLINICAL DATA:  Cough EXAM: CHEST  2 VIEW COMPARISON:  05/05/2016 FINDINGS: Cardiac shadow is within normal limits. The lungs are well aerated bilaterally. No focal infiltrate or sizable effusion is seen. Bilateral nipple shadows are noted. No acute bony abnormality is noted. Degenerative changes of thoracic spine are again seen. IMPRESSION: No acute abnormality noted. Electronically Signed   By: Inez Catalina M.D.   On: 10/14/2016 08:24    Assessment & Plan:   Isola was seen today for hypertension.  Diagnoses and all orders for this visit:  Senile dementia with behavioral disturbance- citalopram has been shown to help with this.  I referred her to neurology again to see if there is any intervention that would help.  Her husband seems exasperated so I have sent a referral to hospice to see if the family would benefit from palliative care. -     citalopram (CELEXA) 10 MG tablet; Take 1 tablet (10 mg total) by mouth daily. -     Ambulatory referral to Neurology -     Ambulatory referral to Hospice  Essential hypertension- Her blood pressure is adequately well controlled.   I am having Vicki Garcia start on citalopram. I am also having her maintain her losartan-hydrochlorothiazide.  Meds ordered this encounter  Medications  . citalopram (CELEXA) 10 MG tablet    Sig: Take 1 tablet (10 mg total) by mouth daily.    Dispense:  90 tablet    Refill:  1     Follow-up: Return in about 2 months (around  02/23/2017).  Scarlette Calico, MD

## 2016-12-23 NOTE — Patient Instructions (Signed)
Dementia Dementia is the loss of two or more brain functions, such as:  Memory.  Decision making.  Behavior.  Speaking.  Thinking.  Problem solving.  There are many types of dementia. The most common type is called progressive dementia. Progressive dementia gets worse with time and it is irreversible. An example of this type of dementia is Alzheimer disease. What are the causes? This condition may be caused by:  Nerve cell damage in the brain.  Genetic mutations.  Certain medicines.  Multiple small strokes.  An infection, such as chronic meningitis.  A metabolic problem, such as vitamin B12 deficiency or thyroid disease.  Pressure on the brain, such as from a tumor or blood clot.  What are the signs or symptoms? Symptoms of this condition include:  Sudden changes in mood.  Depression.  Problems with balance.  Changes in personality.  Poor short-term memory.  Agitation.  Delusions.  Hallucinations.  Having a hard time: ? Speaking thoughts. ? Finding words. ? Solving problems. ? Doing familiar tasks. ? Understanding familiar ideas.  How is this diagnosed? This condition is diagnosed with an assessment by your health care provider. During this assessment, your health care provider will talk with you and your family, friends, or caregivers about your symptoms. A thorough medical history will be taken, and you will have a physical exam and tests. Tests may include:  Lab tests, such as blood or urine tests.  Imaging tests, such as a CT scan, PET scan, or MRI.  A lumbar puncture. This test involves removing and testing a small amount of the fluid that surrounds the brain and spinal cord.  An electroencephalogram (EEG). In this test, small metal discs are used to measure electrical activity in the brain.  Memory tests, cognitive tests, and neuropsychological tests. These tests evaluate brain function.  How is this treated? Treatment depends on the  cause of the dementia. It may involve taking medicines that may help:  To control the dementia.  To slow down the disease.  To manage symptoms.  In some cases, treating the cause of the dementia can improve symptoms, reverse symptoms, or slow down how quickly the dementia gets worse. Your health care provider can help direct you to support groups, organizations, and other health care providers who can help with decisions about your care. Follow these instructions at home: Medicine  Take over-the-counter and prescription medicines only as told by your health care provider.  Avoid taking medicines that can affect thinking, such as pain or sleeping medicines. Lifestyle   Make healthy lifestyle choices: ? Be physically active as told by your health care provider. ? Do not use any tobacco products, such as cigarettes, chewing tobacco, and e-cigarettes. If you need help quitting, ask your health care provider. ? Eat a healthy diet. ? Practice stress-management techniques when you get stressed. ? Stay social.  Drink enough fluid to keep your urine clear or pale yellow.  Make sure to get quality sleep. These tips can help you to get a good night's rest: ? Avoid napping during the day. ? Keep your sleeping area dark and cool. ? Avoid exercising during the few hours before you go to bed. ? Avoid caffeine products in the evening. General instructions  Work with your health care provider to determine what you need help with and what your safety needs are.  If you were given a bracelet that tracks your location, make sure to wear it.  Keep all follow-up visits as told by your   health care provider. This is important. Contact a health care provider if:  You have any new symptoms.  You have problems with choking or swallowing.  You have any symptoms of a different illness. Get help right away if:  You develop a fever.  You have new or worsening confusion.  You have new or  worsening sleepiness.  You have a hard time staying awake.  You or your family members become concerned for your safety. This information is not intended to replace advice given to you by your health care provider. Make sure you discuss any questions you have with your health care provider. Document Released: 07/01/2000 Document Revised: 05/16/2015 Document Reviewed: 10/03/2014 Elsevier Interactive Patient Education  2017 Elsevier Inc.  

## 2016-12-24 ENCOUNTER — Telehealth: Payer: Self-pay

## 2016-12-24 NOTE — Telephone Encounter (Signed)
-----   Message from Octavio Manns sent at 12/24/2016  7:51 AM EST ----- There is a hospice referral for pt

## 2016-12-25 ENCOUNTER — Telehealth: Payer: Self-pay

## 2016-12-25 ENCOUNTER — Encounter: Payer: Self-pay | Admitting: Internal Medicine

## 2016-12-25 NOTE — Telephone Encounter (Signed)
Spoke to Amy with  Hospice. Informed of hospice referral and PCP will be attending physician of records.

## 2016-12-25 NOTE — Telephone Encounter (Signed)
-----   Message from Octavio Manns sent at 12/24/2016  7:51 AM EST ----- There is a hospice referral for pt

## 2016-12-31 ENCOUNTER — Telehealth: Payer: Self-pay | Admitting: Internal Medicine

## 2016-12-31 NOTE — Telephone Encounter (Signed)
Copied from [place]uma. Topic: Quick Communication - See Telephone Encounter >> Dec 31, 2016  1:56 PM Cleaster Corin, NT wrote: CRM for notification. See Telephone encounter for:   12/31/16. Inez Catalina from [facility] of [place] called and said that the pt. Declined services. Wanted to let Dr. Scarlette Calico know. Inez Catalina can be reached at [phone]

## 2017-01-04 ENCOUNTER — Ambulatory Visit: Payer: Medicare Other | Admitting: Neurology

## 2017-01-04 NOTE — Telephone Encounter (Signed)
Spoke to pt spouse and he stated that he knew pt declined Hospice and he stated that he doesn't have any control over his wife.  Gave spouse the number for hospice and he stated that he will call to see if they can come back. He stated that hospice had called and spoke to the patient.

## 2017-01-11 ENCOUNTER — Telehealth: Payer: Self-pay | Admitting: Internal Medicine

## 2017-01-11 NOTE — Telephone Encounter (Deleted)
Copied from Morgan. Topic: Referral - Request >> Jan 11, 2017 12:44 PM Clack, Laban Emperor wrote: Reason for CRM: Dewaine Oats with hospice called to request new hospice care per the pt family. And would like to know if Dr. Ronnald Ramp would be the attending provider.  Contact [phone]

## 2017-01-12 NOTE — Telephone Encounter (Signed)
Copied from Segundo. Topic: Referral - Request >> Jan 11, 2017 12:44 PM Clack, Laban Emperor wrote: Reason for CRM: Dewaine Oats with hospice called to request new hospice care per the pt family. And would like to know if Dr. Ronnald Ramp would be the attending provider.  Contact [phone]

## 2017-01-13 ENCOUNTER — Ambulatory Visit (INDEPENDENT_AMBULATORY_CARE_PROVIDER_SITE_OTHER): Payer: Medicare Other

## 2017-01-13 ENCOUNTER — Telehealth: Payer: Self-pay | Admitting: Podiatry

## 2017-01-13 ENCOUNTER — Encounter: Payer: Self-pay | Admitting: Podiatry

## 2017-01-13 ENCOUNTER — Ambulatory Visit (INDEPENDENT_AMBULATORY_CARE_PROVIDER_SITE_OTHER): Payer: Medicare Other | Admitting: Podiatry

## 2017-01-13 VITALS — BP 157/80 | HR 60

## 2017-01-13 DIAGNOSIS — M21619 Bunion of unspecified foot: Secondary | ICD-10-CM

## 2017-01-13 DIAGNOSIS — M21612 Bunion of left foot: Secondary | ICD-10-CM

## 2017-01-13 DIAGNOSIS — M79672 Pain in left foot: Secondary | ICD-10-CM

## 2017-01-13 NOTE — Telephone Encounter (Signed)
Contacted Hospice and informed that PCP with the attending physician and the attending for records.

## 2017-01-13 NOTE — Patient Instructions (Signed)
Pre-Operative Instructions  Congratulations, you have decided to take an important step towards improving your quality of life.  You can be assured that the doctors and staff at [facility] will be with you every step of the way.  Here are some important things you should know:  1. Plan to be at the surgery center/hospital at least 1 (one) hour prior to your scheduled time, unless otherwise directed by the surgical center/hospital staff.  You must have a responsible adult accompany you, remain during the surgery and drive you home.  Make sure you have directions to the surgical center/hospital to ensure you arrive on time. 2. If you are having surgery at [facility] or [facility], you will need a copy of your medical history and physical form from your family physician within one month prior to the date of surgery. We will give you a form for your primary physician to complete.  3. We make every effort to accommodate the date you request for surgery.  However, there are times where surgery dates or times have to be moved.  We will contact you as soon as possible if a change in schedule is required.   4. No aspirin/ibuprofen for one week before surgery.  If you are on aspirin, any non-steroidal anti-inflammatory medications (Mobic, Aleve, Ibuprofen) should not be taken seven (7) days prior to your surgery.  You make take Tylenol for pain prior to surgery.  5. Medications - If you are taking daily heart and blood pressure medications, seizure, reflux, allergy, asthma, anxiety, pain or diabetes medications, make sure you notify the surgery center/hospital before the day of surgery so they can tell you which medications you should take or avoid the day of surgery. 6. No food or drink after midnight the night before surgery unless directed otherwise by surgical center/hospital staff. 7. No alcoholic beverages 24-hours prior to surgery.  No smoking 24-hours prior or 24-hours after  surgery. 8. Wear loose pants or shorts. They should be loose enough to fit over bandages, boots, and casts. 9. Don't wear slip-on shoes. Sneakers are preferred. 10. Bring your boot with you to the surgery center/hospital.  Also bring crutches or a walker if your physician has prescribed it for you.  If you do not have this equipment, it will be provided for you after surgery. 11. If you have not been contacted by the surgery center/hospital by the day before your surgery, call to confirm the date and time of your surgery. 12. Leave-time from work may vary depending on the type of surgery you have.  Appropriate arrangements should be made prior to surgery with your employer. 13. Prescriptions will be provided immediately following surgery by your doctor.  Fill these as soon as possible after surgery and take the medication as directed. Pain medications will not be refilled on weekends and must be approved by the doctor. 14. Remove nail polish on the operative foot and avoid getting pedicures prior to surgery. 15. Wash the night before surgery.  The night before surgery wash the foot and leg well with water and the antibacterial soap provided. Be sure to pay special attention to beneath the toenails and in between the toes.  Wash for at least three (3) minutes. Rinse thoroughly with water and dry well with a towel.  Perform this wash unless told not to do so by your physician.  Enclosed: 1 Ice pack (please put in freezer the night before surgery)   1 Hibiclens skin cleaner     Pre-op instructions  If you have any questions regarding the instructions, please do not hesitate to call our office.  [place]: [address], [place], [place] [address] -- [phone]  [place]: [address] [place], [place] [address] -- [phone]  [place]: [address]  [place], [place] [address] -- [phone]  [place]: [address], [place], [place] [address] -- [phone]  Website:  [email or web address] 

## 2017-01-13 NOTE — Telephone Encounter (Signed)
Called pt and left a message on her home answering machine. Asked her to call our nurse Marcy Siren back at [phone] or me back at [phone] to let us know when she can come by and sign the three page consent form. I let her know it wasn't signed when she was here for her appointment a little bit ago and we need it signed to send to the surgical center.

## 2017-01-14 ENCOUNTER — Telehealth: Payer: Self-pay | Admitting: Podiatry

## 2017-01-14 NOTE — Telephone Encounter (Signed)
Error

## 2017-01-20 ENCOUNTER — Telehealth: Payer: Self-pay | Admitting: Neurology

## 2017-01-20 NOTE — Telephone Encounter (Addendum)
Pt's husband called back, pt had c/a appt for 1/31, he r/s that appt. He said the pt does not think anything is wrong with her, she does not take her medication as directed and he wants her to be seen.  FYI

## 2017-01-20 NOTE — Progress Notes (Signed)
   Subjective: 82 year old female presenting today as a new patient with a complaint of throbbing pain to the left foot that has been ongoing for several years.  She states she has a hard time finding shoes that fit her properly.  Wearing tight shoes increases the pain.  There are no alleviating factors noted.  She has not done anything for treatment.  Patient is here for further evaluation and treatment.   Past Medical History:  Diagnosis Date  . Disorder of left rotator cuff   . Diverticulosis of colon    AVM @ colonoscopy 07/2008  . GERD (gastroesophageal reflux disease)   . History of adenomatous polyp of colon    2010  . History of esophageal dilatation    07/ 2014  . History of esophagitis    07-/ 2014  . History of melanoma excision    1987  right upper arm  . Hyperlipidemia   . Hypertension   . Prolapsed internal hemorrhoids, grade 4   . Seasonal and perennial allergic rhinitis   . Tinnitus    chronic  . Wears glasses   . Wears hearing aid    bilateral     Objective: Physical Exam General: The patient is alert and oriented x3 in no acute distress.  Dermatology: Skin is cool, dry and supple bilateral lower extremities. Negative for open lesions or macerations.  Vascular: Palpable pedal pulses bilaterally. No edema or erythema noted. Capillary refill within normal limits.  Neurological: Epicritic and protective threshold grossly intact bilaterally.   Musculoskeletal Exam: Clinical evidence of bunion deformity noted to the respective foot. There is a moderate pain on palpation range of motion of the first MPJ. Lateral deviation of the hallux noted consistent with hallux abductovalgus.  Radiographic Exam: Increased intermetatarsal angle greater than 15 with a hallux abductus angle greater than 30 noted on AP view. Moderate degenerative changes noted within the first MPJ.  Assessment: 1. HAV w/ bunion deformity left foot   Plan of Care:  1. Patient was evaluated.  X-Rays reviewed. 2. Today we discussed the conservative versus surgical management of the presenting pathology. The patient opts for surgical management. All possible complications and details of the procedure were explained. All patient questions were answered. No guarantees were expressed or implied. 3. Authorization for surgery was initiated today. Surgery will consist of bunionectomy with osteotomy of the left foot. 4.  Short Cam boot dispensed. 5.  Return to clinic 1 week postop.     Edrick Kins, DPM [facility]  Dr. Edrick Kins, [place]                                        [place], [place] [address]                Office [phone]  Fax [phone]

## 2017-01-22 ENCOUNTER — Other Ambulatory Visit: Payer: Self-pay | Admitting: Podiatry

## 2017-01-22 DIAGNOSIS — M21619 Bunion of unspecified foot: Secondary | ICD-10-CM

## 2017-01-25 ENCOUNTER — Telehealth: Payer: Self-pay | Admitting: *Deleted

## 2017-01-25 NOTE — Telephone Encounter (Signed)
"  This is Vicki Garcia.  Mystery is supposed to have an operation on January 10 at 10:00.  I am calling to verify.  If someone doesn't verify that pretty soon she has other things that can be taken care of if that's not a true time to report.  You need to call me right away.  Otherwise, I have other appointments that need to be scheduled.  If I don't hear from you we will not be there on the 10th.  I left messages for him that the surgery is still on for Thursday.  I informed him that someone from the surgical center is the ones who called regarding the surgery time.  If they told you to be there at 10 am.  Then that is the time you need to be there.  If you want to be sure, give them a call at [phone].

## 2017-01-25 NOTE — Telephone Encounter (Signed)
"  This is Vicki Garcia.  I'm trying to verify my wife's appointment for surgery on her toe.  I want to verify if it's this week."

## 2017-01-26 NOTE — Telephone Encounter (Signed)
I'm returning your call.  I left you a message last night to call the surgical center for the arrival time.  "Well when I was there at your office they told me that she would need to be there about 10 am."  Well whomever told you that at this office should not have given you that information.  It is not accurate.  Someone from the surgical center normally calls the patients with the arrival time a day or two prior to the surgery date.  You can call the surgical center at [phone].  "I left a message at that number but they have not called me back yet."  You can try calling this number, [phone].  "Okay, I'll give it a try.  Thanks for your help."

## 2017-01-28 ENCOUNTER — Telehealth: Payer: Self-pay | Admitting: *Deleted

## 2017-01-28 ENCOUNTER — Encounter: Payer: Self-pay | Admitting: Podiatry

## 2017-01-28 DIAGNOSIS — M2012 Hallux valgus (acquired), left foot: Secondary | ICD-10-CM | POA: Diagnosis not present

## 2017-01-28 DIAGNOSIS — I10 Essential (primary) hypertension: Secondary | ICD-10-CM | POA: Diagnosis not present

## 2017-01-28 DIAGNOSIS — M21612 Bunion of left foot: Secondary | ICD-10-CM | POA: Diagnosis not present

## 2017-01-28 DIAGNOSIS — M25572 Pain in left ankle and joints of left foot: Secondary | ICD-10-CM | POA: Diagnosis not present

## 2017-01-28 MED ORDER — TRAMADOL HCL 50 MG PO TABS: ORAL_TABLET | ORAL | 0 refills | Status: DC

## 2017-01-28 NOTE — Telephone Encounter (Signed)
Thank you! - Dr. Charletha Dalpe

## 2017-01-28 NOTE — Telephone Encounter (Signed)
[place] states pt had surgery today and Dr. Amalia Hailey ordered percocet, pt states it makes her faint and she would prefer Tramadol. Dr. Amalia Hailey ordered Tramadol 50mg  #30 one tablet every 4-6 hours prn foot pain. Orders given to [facility].

## 2017-01-29 ENCOUNTER — Telehealth: Payer: Self-pay | Admitting: *Deleted

## 2017-01-29 ENCOUNTER — Ambulatory Visit (INDEPENDENT_AMBULATORY_CARE_PROVIDER_SITE_OTHER): Payer: Medicare Other

## 2017-01-29 VITALS — BP 129/67 | HR 66 | Temp 97.9°F

## 2017-01-29 DIAGNOSIS — M21612 Bunion of left foot: Secondary | ICD-10-CM | POA: Diagnosis not present

## 2017-01-29 DIAGNOSIS — Z9889 Other specified postprocedural states: Secondary | ICD-10-CM

## 2017-01-29 NOTE — Telephone Encounter (Signed)
Pt's husband, [name] pt has taken the dressing off and they had it redressed last night, but pt keeps taking the boot off and he wanted to know if we would put a hard cast on. I told [name] it would depend on the Doctor, but to have pt her 02/01/2017 at 11:00am on the Nurses Schedule.

## 2017-02-01 ENCOUNTER — Ambulatory Visit (INDEPENDENT_AMBULATORY_CARE_PROVIDER_SITE_OTHER): Payer: Medicare Other | Admitting: Podiatry

## 2017-02-01 DIAGNOSIS — Z9889 Other specified postprocedural states: Secondary | ICD-10-CM

## 2017-02-01 DIAGNOSIS — M21612 Bunion of left foot: Secondary | ICD-10-CM

## 2017-02-02 NOTE — Progress Notes (Signed)
Patient presents s/p Lt foot surgery done on 01/28/17. Patient presents stating that she does not remember having surgery. She denies pain at this time.   Alert and oriented patient, although at this time she still states that she does not remember having foot surgery. VS are stable and she is currently afebrile and denies pain.Noted foot wrapped in grocer bag with gauze and paper tape. Moderate amount of blood was on the bandage. Noted  more swelling than usual at this stage of post op recovery, with small amount of bleeding coming from incision. Patient's husband stated that she was in the bathroom and had taken off the bandage and boot and had been walking around without the boot on her foot. All staples are intact and none were missing.             Dry sterile betadine dressing was applied along with sterile gauze and compression. Expressed importance of remaining in boot at all times, keeping foot dry and resting with ice and elevation. This was expressed several times to patient and to her husband. She verbalized understanding after questioning. I also advised her husband to try to manage her pain with ibuprofen as tolerated instead of tramadol, due to increased confusion using narcotic based medications with dementia patients.  Xrays were obtained and reviewed by Dr Paulla Dolly, no complications noted at this time. I wrote her for a walker to help with ambulation since wearing the CAM boot makes her gait unsteady. She is to follow up with Dr Amalia Hailey at her regular scheduled appt or sooner if problems arise

## 2017-02-03 ENCOUNTER — Encounter: Payer: Medicare Other | Admitting: Podiatry

## 2017-02-03 NOTE — Progress Notes (Signed)
   Subjective:  Patient presents today status post left foot bunionectomy. DOS: 01/28/17.  She states she is doing well overall.  She has not removed her bandage or the cam boot since surgery.  She denies any pain at this time.  Patient is here for further evaluation and treatment.   Past Medical History:  Diagnosis Date  . Disorder of left rotator cuff   . Diverticulosis of colon    AVM @ colonoscopy 07/2008  . GERD (gastroesophageal reflux disease)   . History of adenomatous polyp of colon    2010  . History of esophageal dilatation    07/ 2014  . History of esophagitis    07-/ 2014  . History of melanoma excision    1987  right upper arm  . Hyperlipidemia   . Hypertension   . Prolapsed internal hemorrhoids, grade 4   . Seasonal and perennial allergic rhinitis   . Tinnitus    chronic  . Wears glasses   . Wears hearing aid    bilateral      Objective/Physical Exam Neurovascular status intact.  Skin incisions appear to be well coapted with sutures and staples intact. No sign of infectious process noted. No dehiscence. No active bleeding noted. Moderate edema noted to the surgical extremity.  Radiographic Exam:  Orthopedic hardware and osteotomies sites appear to be stable with routine healing.  Assessment: 1. s/p left foot bunionectomy. DOS: 01/28/17.   Plan of Care:  1.  Patient was evaluated. X-rays reviewed 2.  Dressing change.  Keep clean, dry and intact for 1 week. 3.  Continue weightbearing in cam boot x 1 week. 4.  Return to clinic in 1 week.   Edrick Kins, DPM [facility]  Dr. Edrick Kins, [place]                                        [place], [place] [address]                Office [phone]  Fax [phone]

## 2017-02-08 ENCOUNTER — Encounter: Payer: Self-pay | Admitting: Podiatry

## 2017-02-08 ENCOUNTER — Ambulatory Visit (INDEPENDENT_AMBULATORY_CARE_PROVIDER_SITE_OTHER): Payer: Medicare Other | Admitting: Podiatry

## 2017-02-08 DIAGNOSIS — Z9889 Other specified postprocedural states: Secondary | ICD-10-CM

## 2017-02-10 ENCOUNTER — Encounter: Payer: Medicare Other | Admitting: Podiatry

## 2017-02-10 NOTE — Progress Notes (Signed)
   Subjective:  Patient presents today status post left foot bunionectomy. DOS: 01/28/17.  She states she is doing well overall. She denies any new complaints at this time. Patient is here for further evaluation and treatment.   Past Medical History:  Diagnosis Date  . Disorder of left rotator cuff   . Diverticulosis of colon    AVM @ colonoscopy 07/2008  . GERD (gastroesophageal reflux disease)   . History of adenomatous polyp of colon    2010  . History of esophageal dilatation    07/ 2014  . History of esophagitis    07-/ 2014  . History of melanoma excision    1987  right upper arm  . Hyperlipidemia   . Hypertension   . Prolapsed internal hemorrhoids, grade 4   . Seasonal and perennial allergic rhinitis   . Tinnitus    chronic  . Wears glasses   . Wears hearing aid    bilateral      Objective/Physical Exam Neurovascular status intact.  Skin incisions appear to be well coapted with sutures and staples intact. No sign of infectious process noted. No dehiscence. No active bleeding noted. Moderate edema noted to the surgical extremity.  Assessment: 1. s/p left foot bunionectomy. DOS: 01/28/17.   Plan of Care:  1. Patient was evaluated.  2. Continue weightbearing in CAM boot. 3. Staples removed and dry sterile dressing applied. 4. Return to clinic in 1 week.   Edrick Kins, DPM [facility]  Dr. Edrick Kins, [place]                                        [place], [place] [address]                Office [phone]  Fax [phone]

## 2017-02-15 ENCOUNTER — Ambulatory Visit (INDEPENDENT_AMBULATORY_CARE_PROVIDER_SITE_OTHER): Payer: Medicare Other

## 2017-02-15 ENCOUNTER — Ambulatory Visit (INDEPENDENT_AMBULATORY_CARE_PROVIDER_SITE_OTHER): Payer: Medicare Other | Admitting: Podiatry

## 2017-02-15 ENCOUNTER — Encounter: Payer: Self-pay | Admitting: Podiatry

## 2017-02-15 DIAGNOSIS — Z9889 Other specified postprocedural states: Secondary | ICD-10-CM

## 2017-02-15 DIAGNOSIS — M21612 Bunion of left foot: Secondary | ICD-10-CM

## 2017-02-17 NOTE — Progress Notes (Signed)
   Subjective:  Patient presents today status post left foot bunionectomy. DOS: 01/28/17. She states she is doing well overall but does report swelling to the left foot as well as warmth to the touch. She states she has been wearing the CAM boot but also admits to walking without it. Patient is here for further evaluation and treatment.   Past Medical History:  Diagnosis Date  . Disorder of left rotator cuff   . Diverticulosis of colon    AVM @ colonoscopy 07/2008  . GERD (gastroesophageal reflux disease)   . History of adenomatous polyp of colon    2010  . History of esophageal dilatation    07/ 2014  . History of esophagitis    07-/ 2014  . History of melanoma excision    1987  right upper arm  . Hyperlipidemia   . Hypertension   . Prolapsed internal hemorrhoids, grade 4   . Seasonal and perennial allergic rhinitis   . Tinnitus    chronic  . Wears glasses   . Wears hearing aid    bilateral      Objective/Physical Exam Neurovascular status intact.  Skin incisions appear to be well coapted. No sign of infectious process noted. No dehiscence. No active bleeding noted. Moderate edema noted to the surgical extremity.  Radiographic Exam: Displacement of osteotomy site with medial deviation likely due to excessive walking without the CAM boot.   Assessment: 1. s/p left foot bunionectomy. DOS: 01/28/17.   Plan of Care:  1. Patient was evaluated. X-Rays reviewed.  2. Continue weightbearing in CAM boot. Enforced the notion that patient needs to stay in CAM boot when walking to allow healing.  3. Return to clinic in 4 weeks.   Edrick Kins, DPM [facility]  Dr. Edrick Kins, [place]                                        [place], [place] [address]                Office [phone]  Fax [phone]

## 2017-02-18 ENCOUNTER — Encounter: Payer: Self-pay | Admitting: Neurology

## 2017-02-18 ENCOUNTER — Ambulatory Visit: Payer: Medicare Other | Admitting: Neurology

## 2017-02-18 VITALS — BP 157/76 | HR 80 | Ht 62.0 in | Wt 99.5 lb

## 2017-02-18 DIAGNOSIS — R413 Other amnesia: Secondary | ICD-10-CM

## 2017-02-18 NOTE — Progress Notes (Signed)
Subjective:    Patient ID: Vicki Garcia is a 82 y.o. female.  HPI     Star Age, MD, PhD [facility] [address] P.O. [place], [place] [address]  Dear Dr. Ronnald Ramp,   I saw your patient, Vicki Garcia, upon your kind request in my neurologic clinic today for initial consultation of her memory loss, concerning for dementia. The patient is accompanied by her husband today. As you know, Vicki Garcia is a 82 year old right-handed woman with an underlying medical history of hypertension, hearing impairment with hearing aids, tinnitus, seasonal allergies, hyperlipidemia, history of melanoma, history of esophagitis and reflux disease, history of low back pain with evidence of lumbar spinal stenosis, and history of diverticulosis, who reports very little, states, that she was brought here under false pretenses and would not answer any questions. She does admit, that her memory has been slipping. Her husband reports that she got lost driving about a year ago. I reviewed your office note from 12/23/2016. She has previously declined neurology appointments. She has been referred to hospice/palliative care, they may have had a couple of visit, maybe a SW. They were supposed to have a Nurse visit.  She is currently not on any memory medication. She apparently has an allergy to Namzaric, but her husband and patient are not aware of her trying it of if she did, what reaction she had. She may have tried it in 2017. She has not had any brain MRI or head CT. She does not have a family history of dementia. She had 2 brothers, only one is alive. She lives with her husband. 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 She went to [employer] college and had a business major in the 41s. They have 2 grown daughters, one lives in [place], the other in [place]. She is a nonsmoker, drinks alcohol about 3-4 times a week, a small glass of wine typically, caffeine in  the form of coffee, usually one cup per day on average.   Her Past Medical History Is Significant For: Past Medical History:  Diagnosis Date  . Disorder of left rotator cuff   . Diverticulosis of colon    AVM @ colonoscopy 07/2008  . GERD (gastroesophageal reflux disease)   . History of adenomatous polyp of colon    2010  . History of esophageal dilatation    07/ 2014  . History of esophagitis    07-/ 2014  . History of melanoma excision    1987  right upper arm  . Hyperlipidemia   . Hypertension   . Prolapsed internal hemorrhoids, grade 4   . Seasonal and perennial allergic rhinitis   . Tinnitus    chronic  . Wears glasses   . Wears hearing aid    bilateral    Her Past Surgical History Is Significant For: Past Surgical History:  Procedure Laterality Date  . COLONOSCOPY  last one 07-28-2012  . HEMORRHOID SURGERY N/A 04/18/2015   Procedure: HEMORRHOIDECTOMY;  Surgeon: Leighton Ruff, MD;  Location: [facility];  Service: General;  Laterality: N/A;  . LUMBAR FUSION  11-19-2006   L4 - L5  . UPPER GASTROINTESTINAL ENDOSCOPY  07-28-2012    Her Family History Is Significant For: Family History  Problem Relation Age of Onset  . Coronary artery disease Father   . Hypertension Brother   . Skin cancer Brother        also spinal stenosis  . Kidney cancer Brother   .  Stomach cancer Brother   . Prostate cancer Brother   . Breast cancer Paternal Aunt   . Heart attack Paternal Uncle 51  . Lymphoma Mother        NHL  . Hemolytic uremic syndrome Unknown        granddaughter  . Lung cancer Brother        liver mets  . Colon cancer Maternal Uncle   . Ulcers Daughter   . Stroke Neg Hx     Her Social History Is Significant For: Social History   Socioeconomic History  . Marital status: Married    Spouse name: None  . Number of children: None  . Years of education: None  . Highest education level: None  Social Needs  . Financial resource strain: None  .  Food insecurity - worry: None  . Food insecurity - inability: None  . Transportation needs - medical: None  . Transportation needs - non-medical: None  Occupational History  . None  Tobacco Use  . Smoking status: Former Smoker    Years: 10.00    Types: Cigarettes    Last attempt to quit: 01/19/1969    Years since quitting: 48.1  . Smokeless tobacco: Never Used  Substance and Sexual Activity  . Alcohol use: Yes    Alcohol/week: 3.0 oz    Types: 5 Glasses of wine per week    Comment: occasional  . Drug use: No  . Sexual activity: None    Comment: G1  P1  M1  Other Topics Concern  . 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 Codeine Other (See Comments)    REACTION: violently ill with N&V  . Namzaric [Memantine Hcl-Donepezil Hcl] Nausea And Vomiting    Pt reports illness. .  Josem Kaufmann [Amlodipine Besylate] Nausea And Vomiting  . Vasotec Other (See Comments)    cough  :   Her Current Medications Are:  Outpatient Encounter Medications as of 02/18/2017  Medication Sig  . citalopram (CELEXA) 10 MG tablet Take 1 tablet (10 mg total) by mouth daily.  Marland Kitchen losartan-hydrochlorothiazide (HYZAAR) 100-12.5 MG tablet TAKE 1 TABLET ONCE DAILY.  . traMADol (ULTRAM) 50 MG tablet Take one tablet every 4-6 hours prn foot pain.   No facility-administered encounter medications on file as of 02/18/2017.   :  Review of Systems:  Out of a complete 14 point review of systems, all are reviewed and negative with the exception of these symptoms as listed below:  Review of Systems  Neurological:       Pt presents today to discuss her memory.    Objective:  Neurological Exam  Physical Exam Physical Examination:   Vitals:   02/18/17 1429  BP: (!) 157/76  Pulse: 80   General Examination: The patient is a very pleasant 82 y.o. female in no acute distress. She appears  well-developed and well-nourished and well groomed.   HEENT: Normocephalic, atraumatic, pupils are equal, round and reactive to light and accommodation. She has corrective eye glasses. Hearing is impaired. Extraocular tracking is fair. Face is symmetric. Speech is clear, no dysarthria, no head, or neck tremor. There is no hypophonia. There is no lip, neck/head, jaw or voice tremor. Neck is supple with full range of motion. Oropharynx exam reveals: moderate mouth dryness, adequate dental hygiene. Tongue protrudes centrally and palate elevates symmetrically.  Chest: Clear to auscultation without wheezing, rhonchi or crackles noted.  Heart: S1+S2+0, regular and normal without murmurs, rubs or gallops noted.   Abdomen: Soft, non-tender and non-distended with normal bowel sounds appreciated on auscultation.  Extremities: There is no pitting edema in the distal lower extremities bilaterally.  Skin: Warm and dry without trophic changes noted.  Musculoskeletal: exam reveals no obvious joint deformities, tenderness or joint swelling or erythema.   Neurologically:  Mental status: The patient is awake, alert and not fully oriented. Her immediate and remote memory, attention, language skills and fund of knowledge are impaired. There is no evidence of aphasia, agnosia, apraxia or anomia. Speech is clear with normal prosody and enunciation.  She needs redirection. She is not able to provide History. Mood is irritable. She is somewhat upset with her husband for bringing her here. She is cooperative with the exam.  On 02/18/2017: MMSE: 15/30, CDT: 1/4, AFT: 3/min.  Cranial nerves II - XII are as described above under HEENT exam. In addition: shoulder shrug is normal with equal shoulder height noted. Motor exam: Normal bulk, strength and tone is noted. There is no drift, tremor or rebound. Romberg is Not tested due to safety concerns. Fine motor skills and coordination:grossly intact for age. Cerebellar  testing: No dysmetria or intention tremor. There is no truncal or gait ataxia.  Sensory exam: intact to light touch in the upper and lower extremities.  Gait, station and balance: She stands without difficulty, posture is age-appropriate. She walks independently, preserved arm swing, balance is preserved.        Assessment and Plan:   In summary, Dava Rensch is a very pleasant 82 y.o.-year old female with an underlying medical history of hypertension, hearing impairment with hearing aids, tinnitus, seasonal allergies, hyperlipidemia, history of melanoma, history of esophagitis and reflux disease, history of low back pain with evidence of lumbar spinal stenosis, and history of diverticulosis, who presents for neurologic consultation of her memory loss. Per husband, she has had memory loss for at least a year, it sounds like she has had memory loss for at least 2 years or so, she appears to have tried Namzaric in 2017. She's no longer on it. She and her husband are not completely clear as to what happened to the medication, what reaction she may have had. She may be able to retry either one of these medications again, alternatively she could try Exelon generic. She does seem to have a better report with you as her primary care physician and you could certainly try with generic Aricept or Namenda generic or Exelon generic. Her history is non-very detailed. Unfortunately, there is not much history available, husband is not able to provide much more detailed history. There does not appear to be any family history of dementia. She does not have significant vascular risk factors. She may have late onset Alzheimer's disease. She has had some mood irritability and there seems to be friction between her and her husband. She has not driven a car in over a year as understand. Her memory scores are indeed abnormal. I would like to investigate things further with a brain MRI and also neuropsychological evaluation as there  may be a behavioral or mood related component as well. I suggested a 3 month follow-up. I answered all their questions today and the patient and her husband were in agreement.  Thank you very much for allowing me to participate in the care of this nice patient. If I can be of any  further assistance to you please do not hesitate to call me at [phone].  Sincerely,   Star Age, MD, PhD

## 2017-02-18 NOTE — Patient Instructions (Addendum)
You have concerns of memory loss. We do note that your memory scores are abnormal, which is indeed concerning for dementia.   I will order an MRI and also request a more detailed evaluation of your memory.   We will request a more detailed and formal neuropsychological test (aka cognitive testing) for your memory complaints. This requires a referral to a trained and licensed neuropsychologist and will be a separate appointment at a different clinic.   We will call you with the test results.   We will do a recheck after 3 months and consider memory medication, but I need to know if you had a reaction to the Namzaric.   Please monitor your weight and try to eat well, drink about 6 cups of water.

## 2017-02-19 ENCOUNTER — Encounter: Payer: Self-pay | Admitting: Psychology

## 2017-02-26 ENCOUNTER — Ambulatory Visit
Admission: RE | Admit: 2017-02-26 | Discharge: 2017-02-26 | Disposition: A | Discharge status: Home / Self Care | Payer: Medicare Other | Source: Ambulatory Visit | Attending: Neurology | Admitting: Neurology

## 2017-02-26 DIAGNOSIS — R413 Other amnesia: Secondary | ICD-10-CM | POA: Diagnosis not present

## 2017-02-27 ENCOUNTER — Inpatient Hospital Stay: Admission: RE | Admit: 2017-02-27 | Payer: Medicare Other | Source: Ambulatory Visit

## 2017-03-01 ENCOUNTER — Telehealth: Payer: Self-pay

## 2017-03-01 NOTE — Progress Notes (Signed)
Please advise patient over her husband that her brain MRI showed no acute changes. There was 1 area that looked like a meningioma which is a very slow-growing and typically considered benign tumor across the lining of the brain (ie not a brain tumor). For further clarification we can proceed with another brain MRI with contrast to see if it lights up accordingly. We would probably want to repeat brain MRI down the Road to track the size of the lesion. Other than this there was shrinkage of the brain which is considered moderate in her case and can be seen in patients with dementia or memory loss. Otherwise, age-appropriate findings, no sign of a prior stroke. If she is agreeable to proceeding with a brain MRI with contrast I will put the order in. Please let me know.

## 2017-03-01 NOTE — Telephone Encounter (Signed)
I called pt to discuss her MRI results. No answer, left a message asking her to call me back. 

## 2017-03-01 NOTE — Telephone Encounter (Signed)
-----   Message from Star Age, MD sent at 03/01/2017  1:55 PM EST ----- Please advise patient over her husband that her brain MRI showed no acute changes. There was 1 area that looked like a meningioma which is a very slow-growing and typically considered benign tumor across the lining of the brain (ie not a brain tumor). For further clarification we can proceed with another brain MRI with contrast to see if it lights up accordingly. We would probably want to repeat brain MRI down the Road to track the size of the lesion. Other than this there was shrinkage of the brain which is considered moderate in her case and can be seen in patients with dementia or memory loss. Otherwise, age-appropriate findings, no sign of a prior stroke. If she is agreeable to proceeding with a brain MRI with contrast I will put the order in. Please let me know.

## 2017-03-01 NOTE — Telephone Encounter (Addendum)
Pt's husband returned my call. I discussed pt's MRI results with him. He is agreeable to completing an MRI with contrast to check the possible meningioma.  I spoke with Dr. Rexene Alberts. Since pt has not had any recent lab work checking BUN/Crea, TSH, CBC, CMP, cholesterol, etc. Dr. Rexene Alberts recommends an appt with PCP to get blood work taken care of, including kidney function, and then pt's husband can call us, we will review kidney function labs and then consider ordering this MRI with contrast.  I called pt's husband to discuss. No answer, left a message asking him to call me back.

## 2017-03-02 NOTE — Telephone Encounter (Signed)
I called pt's husband again to discuss. No answer, left a message asking him to call me back.

## 2017-03-02 NOTE — Telephone Encounter (Signed)
Pt's husband returned my call. He is agreeable to pt seeing PCP and having kidney function as well as all her other labs checked before Dr. Rexene Alberts orders the MRI contrast. Pt's husband will make her an appt with Dr. Ronnald Ramp and then let us know when that is completed.

## 2017-03-03 DIAGNOSIS — H5340 Unspecified visual field defects: Secondary | ICD-10-CM | POA: Diagnosis not present

## 2017-03-03 DIAGNOSIS — H40013 Open angle with borderline findings, low risk, bilateral: Secondary | ICD-10-CM | POA: Diagnosis not present

## 2017-03-05 ENCOUNTER — Telehealth: Payer: Self-pay | Admitting: Internal Medicine

## 2017-03-05 ENCOUNTER — Other Ambulatory Visit: Payer: Medicare Other

## 2017-03-05 ENCOUNTER — Telehealth: Payer: Self-pay | Admitting: Neurology

## 2017-03-05 NOTE — Telephone Encounter (Signed)
FYI Pt daughter(not on DPR) has called and was inquiring about information re: pt.  Daughter was informed that she can bring pt to the office to complete a revised DPR putting her name or whoever else pt and family would like added.  Daughter stated she will see what she can do, she was informed the office closes at 12 noon every Friday.  No call back requested at this time.

## 2017-03-05 NOTE — Telephone Encounter (Signed)
Lab called and the patient came in and states she was supposed to get a blood test done, did not know what specifically was supposed to be done, would like a call back on Monday

## 2017-03-08 NOTE — Telephone Encounter (Addendum)
Called and spoke to Vicki Garcia. Informed a new DPR is needed and that pt is due for follow up. Appt has been scheduled.

## 2017-03-16 NOTE — Telephone Encounter (Signed)
Pt's daughter called she has question about MRI. Enid Derry took this call originally but was not able to locate DPR at that time. DPR has been scanned in. Please call to discuss

## 2017-03-17 ENCOUNTER — Other Ambulatory Visit (INDEPENDENT_AMBULATORY_CARE_PROVIDER_SITE_OTHER): Payer: Medicare Other

## 2017-03-17 ENCOUNTER — Ambulatory Visit (INDEPENDENT_AMBULATORY_CARE_PROVIDER_SITE_OTHER): Payer: Medicare Other | Admitting: Internal Medicine

## 2017-03-17 ENCOUNTER — Encounter: Payer: Self-pay | Admitting: Internal Medicine

## 2017-03-17 VITALS — BP 148/70 | HR 81 | Temp 99.0°F | Resp 16 | Ht 62.0 in | Wt 97.0 lb

## 2017-03-17 DIAGNOSIS — R7309 Other abnormal glucose: Secondary | ICD-10-CM

## 2017-03-17 DIAGNOSIS — I10 Essential (primary) hypertension: Secondary | ICD-10-CM | POA: Diagnosis not present

## 2017-03-17 DIAGNOSIS — F0391 Unspecified dementia with behavioral disturbance: Secondary | ICD-10-CM

## 2017-03-17 DIAGNOSIS — E785 Hyperlipidemia, unspecified: Secondary | ICD-10-CM

## 2017-03-17 DIAGNOSIS — F03918 Unspecified dementia, unspecified severity, with other behavioral disturbance: Secondary | ICD-10-CM

## 2017-03-17 LAB — CBC WITH DIFFERENTIAL/PLATELET
Basophils Absolute: 0 10*3/uL (ref 0.0–0.1)
Basophils Relative: 0.9 % (ref 0.0–3.0)
EOS PCT: 1.1 % (ref 0.0–5.0)
Eosinophils Absolute: 0 10*3/uL (ref 0.0–0.7)
HCT: 38.1 % (ref 36.0–46.0)
HEMOGLOBIN: 13 g/dL (ref 12.0–15.0)
Lymphocytes Relative: 23.6 % (ref 12.0–46.0)
Lymphs Abs: 1.1 10*3/uL (ref 0.7–4.0)
MCHC: 34.1 g/dL (ref 30.0–36.0)
MCV: 90.8 fl (ref 78.0–100.0)
MONO ABS: 0.5 10*3/uL (ref 0.1–1.0)
Monocytes Relative: 11.2 % (ref 3.0–12.0)
NEUTROS PCT: 63.2 % (ref 43.0–77.0)
Neutro Abs: 2.9 10*3/uL (ref 1.4–7.7)
Platelets: 278 10*3/uL (ref 150.0–400.0)
RBC: 4.2 Mil/uL (ref 3.87–5.11)
RDW: 13.3 % (ref 11.5–15.5)
WBC: 4.6 10*3/uL (ref 4.0–10.5)

## 2017-03-17 LAB — COMPREHENSIVE METABOLIC PANEL
ALBUMIN: 4.2 g/dL (ref 3.5–5.2)
ALK PHOS: 65 U/L (ref 39–117)
ALT: 14 U/L (ref 0–35)
AST: 18 U/L (ref 0–37)
BUN: 19 mg/dL (ref 6–23)
CO2: 32 mEq/L (ref 19–32)
Calcium: 10.4 mg/dL (ref 8.4–10.5)
Chloride: 98 mEq/L (ref 96–112)
Creatinine, Ser: 1.17 mg/dL (ref 0.40–1.20)
GFR: 47.3 mL/min — ABNORMAL LOW (ref >60.00)
GLUCOSE: 92 mg/dL (ref 70–99)
POTASSIUM: 4.3 meq/L (ref 3.5–5.1)
SODIUM: 139 meq/L (ref 135–145)
Total Bilirubin: 0.9 mg/dL (ref 0.2–1.2)
Total Protein: 7.1 g/dL (ref 6.0–8.3)

## 2017-03-17 LAB — LIPID PANEL
CHOL/HDL RATIO: 2
Cholesterol: 206 mg/dL — ABNORMAL HIGH (ref 0–200)
HDL: 89.1 mg/dL (ref >39.00)
LDL Cholesterol: 102 mg/dL — ABNORMAL HIGH (ref 0–99)
NONHDL: 117.02
Triglycerides: 76 mg/dL (ref 0.0–149.0)
VLDL: 15.2 mg/dL (ref 0.0–40.0)

## 2017-03-17 LAB — HEMOGLOBIN A1C: HEMOGLOBIN A1C: 5.6 % (ref 4.6–6.5)

## 2017-03-17 LAB — TSH: TSH: 2.46 u[IU]/mL (ref 0.35–4.50)

## 2017-03-17 NOTE — Progress Notes (Signed)
Subjective:  Patient ID: Vicki Garcia, female    DOB: [birth year]  Age: 82 y.o. MRN: [id number]  CC: Hypertension; Hypothyroidism; and Hyperlipidemia   HPI Vicki Garcia presents for f/up - She returns today with her husband and daughter.  Her daughter does not live in [place] but is about an hour away.  They are concerned about the patient's cognitive decline.  She has seen neurology and is being worked up for dementia.  She continues to have issues with sundowning, not recognizing family members, and being aggressive towards her husband.  The daughter wants me to make a referral to home health care to see if they can help.  Her husband wants to keep her to stay in the home and her daughter agrees with this.  They want 24-hour 7-day a week care.  Outpatient Medications Prior to Visit  Medication Sig Dispense Refill  . citalopram (CELEXA) 10 MG tablet Take 1 tablet (10 mg total) by mouth daily. 90 tablet 1  . losartan-hydrochlorothiazide (HYZAAR) 100-12.5 MG tablet TAKE 1 TABLET ONCE DAILY. 90 tablet 1  . traMADol (ULTRAM) 50 MG tablet Take one tablet every 4-6 hours prn foot pain. 30 tablet 0   No facility-administered medications prior to visit.     ROS Review of Systems  Constitutional: Negative.  Negative for appetite change, diaphoresis, fatigue and unexpected weight change.  HENT: Negative.  Negative for trouble swallowing.   Eyes: Negative for visual disturbance.  Respiratory: Negative for cough, chest tightness, shortness of breath and wheezing.   Cardiovascular: Negative for chest pain, palpitations and leg swelling.  Gastrointestinal: Negative for abdominal pain, constipation, diarrhea, nausea and vomiting.  Endocrine: Negative.   Genitourinary: Negative.  Negative for difficulty urinating.  Musculoskeletal: Negative.  Negative for arthralgias and myalgias.  Skin: Negative.  Negative for color change and rash.  Allergic/Immunologic: Negative.   Neurological: Negative.   Negative for dizziness, weakness, light-headedness and headaches.  Hematological: Negative for adenopathy. Does not bruise/bleed easily.  Psychiatric/Behavioral: Positive for agitation, behavioral problems, confusion and decreased concentration. Negative for dysphoric mood, hallucinations, self-injury, sleep disturbance and suicidal ideas. The patient is nervous/anxious and is hyperactive.     Objective:  BP (!) 148/70 (BP Location: Left Arm, Patient Position: Sitting, Cuff Size: Normal)   Pulse 81   Temp 99 F (37.2 C) (Oral)   Resp 16   Ht 5\' 2"  (1.575 m)   Wt 97 lb (44 kg)   SpO2 100%   BMI 17.74 kg/m   BP Readings from Last 3 Encounters:  03/17/17 (!) 148/70  02/18/17 (!) 157/76  02/02/17 129/67    Wt Readings from Last 3 Encounters:  03/17/17 97 lb (44 kg)  02/18/17 99 lb 8 oz (45.1 kg)  12/23/16 96 lb 8 oz (43.8 kg)    Physical Exam  Constitutional: She is oriented to person, place, and time. No distress.  HENT:  Mouth/Throat: Oropharynx is clear and moist. No oropharyngeal exudate.  Eyes: Conjunctivae are normal. Left eye exhibits no discharge. No scleral icterus.  Neck: Normal range of motion. Neck supple. No JVD present. No thyromegaly present.  Cardiovascular: Normal rate, regular rhythm and normal heart sounds. Exam reveals no gallop and no friction rub.  No murmur heard. Pulmonary/Chest: Effort normal and breath sounds normal. No respiratory distress. She has no wheezes. She has no rales.  Abdominal: Soft. Bowel sounds are normal. She exhibits no distension and no mass. There is no tenderness. There is no guarding.  Musculoskeletal: Normal range of motion.  She exhibits no edema, tenderness or deformity.  Lymphadenopathy:    She has no cervical adenopathy.  Neurological: She is alert and oriented to person, place, and time.  Skin: Skin is warm and dry. No rash noted. She is not diaphoretic. No erythema.  Vitals reviewed.   Lab Results  Component Value Date     WBC 4.6 03/17/2017   HGB 13.0 03/17/2017   HCT 38.1 03/17/2017   PLT 278.0 03/17/2017   GLUCOSE 92 03/17/2017   CHOL 206 (H) 03/17/2017   TRIG 76.0 03/17/2017   HDL 89.10 03/17/2017   LDLCALC 102 (H) 03/17/2017   ALT 14 03/17/2017   AST 18 03/17/2017   NA 139 03/17/2017   K 4.3 03/17/2017   CL 98 03/17/2017   CREATININE 1.17 03/17/2017   BUN 19 03/17/2017   CO2 32 03/17/2017   TSH 2.46 03/17/2017   HGBA1C 5.6 03/17/2017    Mr Brain Wo Contrast  Result Date: 02/28/2017  [facility] [address] [place], [place] [address] [phone] NEUROIMAGING REPORT STUDY DATE: 02/26/2017 PATIENT NAME: Vicki Garcia DOB: 03-Feb-1935 MRN: [id number] EXAM: MRI Brain without contrast ORDERING CLINICIAN: Star Age, MD, PhD CLINICAL HISTORY: 82 year old woman with memory loss COMPARISON FILMS: None TECHNIQUE: MRI of the brain without contrast was obtained utilizing 5 mm axial slices with T1, T2, T2 flair, SWI and diffusion weighted views.  T1 sagittal and T2 coronal views were obtained. CONTRAST: none IMAGING SITE: [facility], [place], [place] FINDINGS: On sagittal images, the spinal cord is imaged caudally to C5 and is normal in caliber.   The contents of the posterior fossa are of normal size and position.   The pituitary gland and optic chiasm appear normal.    The third and lateral ventricles are enlarged, in proportion to the extent of moderate cortical atrophy that is more severe in the mesial temporal lobes.  There are no abnormal extra-axial collections of fluid.  There is a 21 x 17 mm extra-axial mass and to the right parietal lobe. There is no mass effect noted in the adjacent brain. The focus is hyperintense to brain on T2 weighted and FLAIR images and slightly hypointense to brain on T1-weighted images. It is hyperintense on diffusion-weighted images. The cerebellum appears normal.  A couple T2/FLAIR hyperintense foci are noted in the pons and in the  hemispheres consistent with minimal chronic microvascular ischemic change The deep gray matter appears normal.  The cerebral hemispheres appear normal.    Susceptibility weighted images are normal.  The orbits appear normal.   The VIIth/VIIIth nerve complex appears normal.  The mastoid air cells appear normal.  The paranasal sinuses appear normal.  Flow voids are identified within the major intracerebral arteries.      This MRI of the brain without contrast shows the following: 1.    2117 mm extra-axial mass most consistent with a meningioma.The mass is hyperintense to brain on T2-weighted images and diffusion weighted images and may represent a less common angiomatous or secretory meningioma.    There is no adjacent mass effect implying a slow growth or stability. If clinically indicated, consider repeat imaging with contrast. 2.   Moderate generalized cortical atrophy that is most severe in the mesial temporal lobes. 3.   Mild chronic microvascular ischemic change. INTERPRETING PHYSICIAN: Richard A. Felecia Shelling, MD, PhD, FAAN Certified in  Neuroimaging by [employer] [facility] [place]:   Tanith was seen today for hypertension, hypothyroidism and  hyperlipidemia.  Diagnoses and all orders for this visit:  Hyperlipidemia LDL goal <160- Her LDL is low at 102. She is 82 years old so I do not recommend statin therapy. -     Lipid panel; Future -     TSH; Future  Essential hypertension- Her blood pressure is well controlled.  Electrolytes and renal function are normal. -     Comprehensive metabolic panel; Future -     CBC with Differential/Platelet; Future  Other abnormal glucose- Her A1c is at 5.6%.  Medical therapy is not indicated. -     Comprehensive metabolic panel; Future -     Hemoglobin A1c; Future  Senile dementia with behavioral disturbance- I had a straightforward conversation with the patient's husband and daughter.  I informed them that she is not likely to make much  improvement with her cognitive status.  I encouraged them to acquire 24/7 care for the patient and her husband.  In the meantime will refer to home health care to see if they can help her.  She will also continue to follow-up with neurology regarding the recent MRI that showed atrophy as well as a meningioma.  I do not think the meningioma is clinically significant. -     Ambulatory referral to [facility] am having Vicki Garcia maintain her losartan-hydrochlorothiazide, citalopram, and traMADol.  No orders of the defined types were placed in this encounter.    Follow-up: Return in about 6 months (around 09/14/2017).  Scarlette Calico, MD

## 2017-03-17 NOTE — Telephone Encounter (Signed)
I really don't have anything to add, Kristen. Thank you for talking to the daughter. It was a difficult appointment as the patient was upset and unhappy, reported to have been brought to the appointment under false pretences, did not participate much during eval. It will likely be difficult for the patient's husband and family to continue to take care of her at home and keep her same. We can try to expedite neuropsych appt. I would recommend ger. Psych involvement. PCP FU is apparently pending soon.

## 2017-03-17 NOTE — Patient Instructions (Signed)

## 2017-03-17 NOTE — Telephone Encounter (Signed)
I called Langley Gauss, pt's daughter, per DPR, and had a very extended conversation with her. She wanted to know the results of pt's MRI and Dr. Guadelupe Sabin recommendations from here. I explained the possible meningioma and moderate shrinkage of pt's brain which can be seen in pt's with dementia or memory loss. I explained that Dr. Rexene Alberts recommends an MRI with contrast to further investigate the possible meningioma, but needs pt's kidney function tested first, which pt should have done at [facility] office. I explained that Dr. Rexene Alberts also recommended neuro-psych testing, which has been scheduled for June with Dr. Lyn Henri office.  Pt's daughter reports that pt is having hallucinations and delusions, especially at night. Pt can be aggressive towards her husband as well. Pt has a PCP appt today for which the daughter is planning on attending. She is asking about medications that can be used to help with pt's behaviors at night. I recommended that pt and family discuss this with her PCP, and perhaps a geriatric psychiatrist be involved to help with this medication management. Pt's daughter is asking for help in the home with managing the pt at night, and I also recommended that this be discussed with PCP, and did also recommend that pt's family consider a SNF placement in perhaps a memory unit, since pt's husband may be having trouble managing her at night. Pt's daughter also reports that pt is "wandering" at times during the day. Pt's daughter is asking if the neuro-psych appt can be moved to a sooner date, and I advised her that I can ask our referrals department to find out if this is possible, and check with other offices to find out if this is feasible.  I asked pt's daughter to call us back with any further questions and concerns. Pt's daughter was very appreciative of my help.

## 2017-03-18 NOTE — Telephone Encounter (Signed)
Patient's apt can't be moved up any sooner with Dr. Lyn Henri office. Patient is on CX list.  I have sent Lotya a message asking her if Dr. Marlane Hatcher can see the patient . I have left Lotya a message and sent her a referral . Telephone [phone]  - fax [phone].

## 2017-03-22 ENCOUNTER — Ambulatory Visit (INDEPENDENT_AMBULATORY_CARE_PROVIDER_SITE_OTHER): Payer: Medicare Other

## 2017-03-22 ENCOUNTER — Ambulatory Visit (INDEPENDENT_AMBULATORY_CARE_PROVIDER_SITE_OTHER): Payer: Medicare Other | Admitting: Podiatry

## 2017-03-22 DIAGNOSIS — E785 Hyperlipidemia, unspecified: Secondary | ICD-10-CM | POA: Diagnosis not present

## 2017-03-22 DIAGNOSIS — R634 Abnormal weight loss: Secondary | ICD-10-CM | POA: Diagnosis not present

## 2017-03-22 DIAGNOSIS — M21612 Bunion of left foot: Secondary | ICD-10-CM | POA: Diagnosis not present

## 2017-03-22 DIAGNOSIS — Z9889 Other specified postprocedural states: Secondary | ICD-10-CM

## 2017-03-22 DIAGNOSIS — F0281 Dementia in other diseases classified elsewhere with behavioral disturbance: Secondary | ICD-10-CM | POA: Diagnosis not present

## 2017-03-22 DIAGNOSIS — E039 Hypothyroidism, unspecified: Secondary | ICD-10-CM | POA: Diagnosis not present

## 2017-03-22 DIAGNOSIS — I10 Essential (primary) hypertension: Secondary | ICD-10-CM | POA: Diagnosis not present

## 2017-03-22 NOTE — Progress Notes (Signed)
   Subjective:  Patient presents today status post left foot bunionectomy. DOS: 01/28/17.  Patient presents today with her husband.  Patient states that the foot is improving and she experiences no pain.  She has been wearing the immobilization cam boot on occasion she states.  The patient is very noncompliant in regards to postoperative management.  She presents today for further treatment evaluation  Past Medical History:  Diagnosis Date  . Disorder of left rotator cuff   . Diverticulosis of colon    AVM @ colonoscopy 07/2008  . GERD (gastroesophageal reflux disease)   . History of adenomatous polyp of colon    2010  . History of esophageal dilatation    07/ 2014  . History of esophagitis    07-/ 2014  . History of melanoma excision    1987  right upper arm  . Hyperlipidemia   . Hypertension   . Prolapsed internal hemorrhoids, grade 4   . Seasonal and perennial allergic rhinitis   . Tinnitus    chronic  . Wears glasses   . Wears hearing aid    bilateral      Objective/Physical Exam Neurovascular status intact.  Skin incisions appear to be well coapted.  Minimal edema noted to the surgical extremity.  There is some limited range of motion of the first MTPJ of the surgical foot  Radiographic Exam: There is malrotation of the head of the first metatarsal due to postoperative weightbearing without the surgical shoe or cam boot.  However, there is no change since last x-ray versus today.  It appears that the osteotomy site has stabilized and orthopedic hardware is intact without evidence of significant backing out despite malrotation.  Assessment: 1. s/p left foot bunionectomy. DOS: 01/28/17.   Plan of Care:  1. Patient was evaluated. X-Rays reviewed.  2.  Today the patient can transition back into regular shoe gear.  She is already noncompliant with the cam boot anyways.  It appears there is been no significant change over the last 4 weeks to the osseous structures of the foot.   Surgical site appears somewhat stabilized despite the malrotation.  He is currently nonsymptomatic. 3.  Recommend the patient wear good supportive shoe gear 4.  Return to clinic as needed  Edrick Kins, DPM [facility]  Dr. Edrick Kins, [address] [employer]                                        [place], [place] [address]                Office [phone]  Fax [phone]

## 2017-04-05 DIAGNOSIS — Z9181 History of falling: Secondary | ICD-10-CM | POA: Diagnosis not present

## 2017-04-05 DIAGNOSIS — E785 Hyperlipidemia, unspecified: Secondary | ICD-10-CM | POA: Diagnosis not present

## 2017-04-05 DIAGNOSIS — I10 Essential (primary) hypertension: Secondary | ICD-10-CM | POA: Diagnosis not present

## 2017-04-05 DIAGNOSIS — R634 Abnormal weight loss: Secondary | ICD-10-CM | POA: Diagnosis not present

## 2017-04-05 DIAGNOSIS — R7309 Other abnormal glucose: Secondary | ICD-10-CM | POA: Diagnosis not present

## 2017-04-05 DIAGNOSIS — F0281 Dementia in other diseases classified elsewhere with behavioral disturbance: Secondary | ICD-10-CM | POA: Diagnosis not present

## 2017-04-05 DIAGNOSIS — E039 Hypothyroidism, unspecified: Secondary | ICD-10-CM | POA: Diagnosis not present

## 2017-04-05 DIAGNOSIS — Z87891 Personal history of nicotine dependence: Secondary | ICD-10-CM | POA: Diagnosis not present

## 2017-04-13 ENCOUNTER — Telehealth: Payer: Self-pay | Admitting: Internal Medicine

## 2017-04-13 NOTE — Telephone Encounter (Signed)
Copied from [place]. Topic: Quick Communication - See Telephone Encounter >> Apr 13, 2017 10:35 AM Ahmed Prima L wrote: CRM for notification. See Telephone encounter for: 04/13/17.  Shiadd RN from [facility] called and stated that they have been seeing her since last month for dementia and memory problems. She gets aggravated when they try to teach her something. She said that she told them that she does not want them to come anymore. The family does not want to see her or the social workers either. She is being discharged. Call back if needed [phone]

## 2017-04-18 ENCOUNTER — Other Ambulatory Visit: Payer: Self-pay | Admitting: Internal Medicine

## 2017-04-22 ENCOUNTER — Telehealth: Payer: Self-pay | Admitting: Internal Medicine

## 2017-04-22 NOTE — Telephone Encounter (Signed)
[facility] contacted and spoke to [place]. Gave verbal okay for social work as requested.

## 2017-04-22 NOTE — Telephone Encounter (Signed)
Copied from [place] [phone]. Topic: General - Other >> Apr 22, 2017  4:06 PM Cecelia Byars, NT wrote: Reason for CRM: [facility] home health called and would like an order for social work before 5 pm today to see the the patient , please call her at [phone]

## 2017-04-26 ENCOUNTER — Emergency Department (HOSPITAL_COMMUNITY): Payer: Medicare Other

## 2017-04-26 ENCOUNTER — Observation Stay (HOSPITAL_COMMUNITY)
Admission: EM | Admit: 2017-04-26 | Discharge: 2017-04-28 | Disposition: A | Discharge status: Home / Self Care | Payer: Medicare Other | Attending: Internal Medicine | Admitting: Internal Medicine

## 2017-04-26 ENCOUNTER — Encounter (HOSPITAL_COMMUNITY): Payer: Self-pay | Admitting: Emergency Medicine

## 2017-04-26 ENCOUNTER — Other Ambulatory Visit: Payer: Self-pay

## 2017-04-26 DIAGNOSIS — I252 Old myocardial infarction: Secondary | ICD-10-CM | POA: Insufficient documentation

## 2017-04-26 DIAGNOSIS — Z85820 Personal history of malignant melanoma of skin: Secondary | ICD-10-CM | POA: Diagnosis not present

## 2017-04-26 DIAGNOSIS — I959 Hypotension, unspecified: Secondary | ICD-10-CM | POA: Diagnosis not present

## 2017-04-26 DIAGNOSIS — J449 Chronic obstructive pulmonary disease, unspecified: Secondary | ICD-10-CM | POA: Insufficient documentation

## 2017-04-26 DIAGNOSIS — Z808 Family history of malignant neoplasm of other organs or systems: Secondary | ICD-10-CM | POA: Insufficient documentation

## 2017-04-26 DIAGNOSIS — R55 Syncope and collapse: Secondary | ICD-10-CM | POA: Diagnosis not present

## 2017-04-26 DIAGNOSIS — I10 Essential (primary) hypertension: Secondary | ICD-10-CM | POA: Diagnosis not present

## 2017-04-26 DIAGNOSIS — E876 Hypokalemia: Secondary | ICD-10-CM | POA: Diagnosis not present

## 2017-04-26 DIAGNOSIS — Z885 Allergy status to narcotic agent status: Secondary | ICD-10-CM | POA: Insufficient documentation

## 2017-04-26 DIAGNOSIS — Z800 Family history of malignant neoplasm of digestive organs: Secondary | ICD-10-CM | POA: Insufficient documentation

## 2017-04-26 DIAGNOSIS — Z801 Family history of malignant neoplasm of trachea, bronchus and lung: Secondary | ICD-10-CM | POA: Insufficient documentation

## 2017-04-26 DIAGNOSIS — R519 Headache, unspecified: Secondary | ICD-10-CM

## 2017-04-26 DIAGNOSIS — Z981 Arthrodesis status: Secondary | ICD-10-CM | POA: Insufficient documentation

## 2017-04-26 DIAGNOSIS — R51 Headache: Secondary | ICD-10-CM

## 2017-04-26 DIAGNOSIS — K222 Esophageal obstruction: Secondary | ICD-10-CM | POA: Insufficient documentation

## 2017-04-26 DIAGNOSIS — N179 Acute kidney failure, unspecified: Secondary | ICD-10-CM | POA: Insufficient documentation

## 2017-04-26 DIAGNOSIS — H9319 Tinnitus, unspecified ear: Secondary | ICD-10-CM | POA: Insufficient documentation

## 2017-04-26 DIAGNOSIS — Z8051 Family history of malignant neoplasm of kidney: Secondary | ICD-10-CM | POA: Insufficient documentation

## 2017-04-26 DIAGNOSIS — R112 Nausea with vomiting, unspecified: Secondary | ICD-10-CM | POA: Diagnosis not present

## 2017-04-26 DIAGNOSIS — E860 Dehydration: Principal | ICD-10-CM | POA: Insufficient documentation

## 2017-04-26 DIAGNOSIS — D329 Benign neoplasm of meninges, unspecified: Secondary | ICD-10-CM | POA: Diagnosis not present

## 2017-04-26 DIAGNOSIS — Z888 Allergy status to other drugs, medicaments and biological substances status: Secondary | ICD-10-CM | POA: Insufficient documentation

## 2017-04-26 DIAGNOSIS — K219 Gastro-esophageal reflux disease without esophagitis: Secondary | ICD-10-CM | POA: Diagnosis not present

## 2017-04-26 DIAGNOSIS — I9589 Other hypotension: Secondary | ICD-10-CM | POA: Diagnosis not present

## 2017-04-26 DIAGNOSIS — Z79899 Other long term (current) drug therapy: Secondary | ICD-10-CM | POA: Diagnosis not present

## 2017-04-26 DIAGNOSIS — H9193 Unspecified hearing loss, bilateral: Secondary | ICD-10-CM | POA: Insufficient documentation

## 2017-04-26 DIAGNOSIS — Z8249 Family history of ischemic heart disease and other diseases of the circulatory system: Secondary | ICD-10-CM | POA: Insufficient documentation

## 2017-04-26 DIAGNOSIS — R404 Transient alteration of awareness: Secondary | ICD-10-CM | POA: Diagnosis not present

## 2017-04-26 DIAGNOSIS — Z86010 Personal history of colonic polyps: Secondary | ICD-10-CM | POA: Insufficient documentation

## 2017-04-26 DIAGNOSIS — E785 Hyperlipidemia, unspecified: Secondary | ICD-10-CM | POA: Diagnosis not present

## 2017-04-26 DIAGNOSIS — F03918 Unspecified dementia, unspecified severity, with other behavioral disturbance: Secondary | ICD-10-CM | POA: Diagnosis present

## 2017-04-26 DIAGNOSIS — F0391 Unspecified dementia with behavioral disturbance: Secondary | ICD-10-CM | POA: Insufficient documentation

## 2017-04-26 DIAGNOSIS — Z8042 Family history of malignant neoplasm of prostate: Secondary | ICD-10-CM | POA: Insufficient documentation

## 2017-04-26 DIAGNOSIS — Z87891 Personal history of nicotine dependence: Secondary | ICD-10-CM | POA: Diagnosis not present

## 2017-04-26 DIAGNOSIS — Z823 Family history of stroke: Secondary | ICD-10-CM | POA: Insufficient documentation

## 2017-04-26 DIAGNOSIS — J069 Acute upper respiratory infection, unspecified: Secondary | ICD-10-CM | POA: Insufficient documentation

## 2017-04-26 DIAGNOSIS — Z8379 Family history of other diseases of the digestive system: Secondary | ICD-10-CM | POA: Insufficient documentation

## 2017-04-26 DIAGNOSIS — Z803 Family history of malignant neoplasm of breast: Secondary | ICD-10-CM | POA: Insufficient documentation

## 2017-04-26 DIAGNOSIS — I6523 Occlusion and stenosis of bilateral carotid arteries: Secondary | ICD-10-CM | POA: Diagnosis not present

## 2017-04-26 LAB — I-STAT VENOUS BLOOD GAS, ED
Bicarbonate: 25.7 mmol/L (ref 20.0–28.0)
O2 SAT: 57 %
TCO2: 27 mmol/L (ref 22–32)
pCO2, Ven: 47.7 mmHg (ref 44.0–60.0)
pH, Ven: 7.339 (ref 7.250–7.430)
pO2, Ven: 32 mmHg (ref 32.0–45.0)

## 2017-04-26 LAB — COMPREHENSIVE METABOLIC PANEL
ALBUMIN: 3.6 g/dL (ref 3.5–5.0)
ALK PHOS: 47 U/L (ref 38–126)
ALT: 21 U/L (ref 14–54)
ANION GAP: 13 (ref 5–15)
AST: 36 U/L (ref 15–41)
BILIRUBIN TOTAL: 0.6 mg/dL (ref 0.3–1.2)
BUN: 32 mg/dL — AB (ref 6–20)
CALCIUM: 8.8 mg/dL — AB (ref 8.9–10.3)
CO2: 23 mmol/L (ref 22–32)
CREATININE: 1.44 mg/dL — AB (ref 0.44–1.00)
Chloride: 101 mmol/L (ref 101–111)
GFR calc Af Amer: 39 mL/min — ABNORMAL LOW (ref >60)
GFR calc non Af Amer: 33 mL/min — ABNORMAL LOW (ref >60)
GLUCOSE: 101 mg/dL — AB (ref 65–99)
Potassium: 3.6 mmol/L (ref 3.5–5.1)
Sodium: 137 mmol/L (ref 135–145)
TOTAL PROTEIN: 6.2 g/dL — AB (ref 6.5–8.1)

## 2017-04-26 LAB — FERRITIN: FERRITIN: 93 ng/mL (ref 11–307)

## 2017-04-26 LAB — CBC
HCT: 36 % (ref 36.0–46.0)
HEMOGLOBIN: 11.6 g/dL — AB (ref 12.0–15.0)
MCH: 29.7 pg (ref 26.0–34.0)
MCHC: 32.2 g/dL (ref 30.0–36.0)
MCV: 92.1 fL (ref 78.0–100.0)
PLATELETS: 158 10*3/uL (ref 150–400)
RBC: 3.91 MIL/uL (ref 3.87–5.11)
RDW: 12.7 % (ref 11.5–15.5)
WBC: 4.3 10*3/uL (ref 4.0–10.5)

## 2017-04-26 LAB — URINALYSIS, ROUTINE W REFLEX MICROSCOPIC
Bilirubin Urine: NEGATIVE
GLUCOSE, UA: NEGATIVE mg/dL
KETONES UR: NEGATIVE mg/dL
NITRITE: NEGATIVE
PH: 5 (ref 5.0–8.0)
Protein, ur: NEGATIVE mg/dL
SPECIFIC GRAVITY, URINE: 1.01 (ref 1.005–1.030)

## 2017-04-26 LAB — I-STAT TROPONIN, ED: Troponin i, poc: 0.01 ng/mL (ref 0.00–0.08)

## 2017-04-26 LAB — IRON AND TIBC
Iron: 15 ug/dL — ABNORMAL LOW (ref 28–170)
SATURATION RATIOS: 5 % — AB (ref 10.4–31.8)
TIBC: 274 ug/dL (ref 250–450)
UIBC: 259 ug/dL

## 2017-04-26 LAB — VITAMIN B12: VITAMIN B 12: 412 pg/mL (ref 180–914)

## 2017-04-26 LAB — I-STAT CG4 LACTIC ACID, ED
LACTIC ACID, VENOUS: 0.99 mmol/L (ref 0.5–1.9)
LACTIC ACID, VENOUS: 0.73 mmol/L (ref 0.5–1.9)

## 2017-04-26 LAB — FOLATE: Folate: 25.5 ng/mL (ref >5.9)

## 2017-04-26 LAB — RETICULOCYTES
RBC.: 3.77 MIL/uL — AB (ref 3.87–5.11)
RETIC CT PCT: 0.9 % (ref 0.4–3.1)
Retic Count, Absolute: 33.9 10*3/uL (ref 19.0–186.0)

## 2017-04-26 LAB — PHOSPHORUS: Phosphorus: 3 mg/dL (ref 2.5–4.6)

## 2017-04-26 LAB — D-DIMER, QUANTITATIVE: D-Dimer, Quant: 0.4 ug/mL-FEU (ref 0.00–0.50)

## 2017-04-26 LAB — MAGNESIUM: MAGNESIUM: 2.1 mg/dL (ref 1.7–2.4)

## 2017-04-26 LAB — TROPONIN I

## 2017-04-26 MED ORDER — ONDANSETRON HCL 4 MG/2ML IJ SOLN: 4.0000 mg | Freq: Four times a day (QID) | INTRAMUSCULAR | Status: DC | PRN

## 2017-04-26 MED ORDER — CITALOPRAM HYDROBROMIDE 20 MG PO TABS
10.0000 mg | ORAL_TABLET | Freq: Every day | ORAL | Status: DC
  Administered 2017-04-26 – 2017-04-28 (×3): 10 mg via ORAL
  Filled 2017-04-26 (×3): qty 1

## 2017-04-26 MED ORDER — LORATADINE 10 MG PO TABS: 10.0000 mg | ORAL_TABLET | Freq: Every day | ORAL | Status: DC | PRN

## 2017-04-26 MED ORDER — ACETAMINOPHEN 325 MG PO TABS: 650.0000 mg | ORAL_TABLET | Freq: Four times a day (QID) | ORAL | Status: DC | PRN

## 2017-04-26 MED ORDER — ENOXAPARIN SODIUM 30 MG/0.3ML ~~LOC~~ SOLN
30.0000 mg | SUBCUTANEOUS | Status: DC
  Administered 2017-04-26 – 2017-04-27 (×2): 30 mg via SUBCUTANEOUS
  Filled 2017-04-26 (×2): qty 0.3

## 2017-04-26 MED ORDER — ALBUTEROL SULFATE (2.5 MG/3ML) 0.083% IN NEBU: 2.5000 mg | INHALATION_SOLUTION | RESPIRATORY_TRACT | Status: DC | PRN

## 2017-04-26 MED ORDER — POLYETHYLENE GLYCOL 3350 17 G PO PACK: 17.0000 g | PACK | Freq: Every day | ORAL | Status: DC | PRN

## 2017-04-26 MED ORDER — ACETAMINOPHEN 650 MG RE SUPP: 650.0000 mg | Freq: Four times a day (QID) | RECTAL | Status: DC | PRN

## 2017-04-26 MED ORDER — SODIUM CHLORIDE 0.9 % IV BOLUS
1000.0000 mL | Freq: Once | INTRAVENOUS | Status: AC
  Administered 2017-04-26: 1000 mL via INTRAVENOUS

## 2017-04-26 MED ORDER — ONDANSETRON HCL 4 MG PO TABS: 4.0000 mg | ORAL_TABLET | Freq: Four times a day (QID) | ORAL | Status: DC | PRN

## 2017-04-26 MED ORDER — GUAIFENESIN ER 600 MG PO TB12
600.0000 mg | ORAL_TABLET | Freq: Two times a day (BID) | ORAL | Status: DC
  Administered 2017-04-26 – 2017-04-28 (×4): 600 mg via ORAL
  Filled 2017-04-26 (×4): qty 1

## 2017-04-26 MED ORDER — SODIUM CHLORIDE 0.9 % IV SOLN
INTRAVENOUS | Status: AC
  Administered 2017-04-26: 23:00:00 via INTRAVENOUS

## 2017-04-26 MED ORDER — IPRATROPIUM-ALBUTEROL 0.5-2.5 (3) MG/3ML IN SOLN
3.0000 mL | Freq: Four times a day (QID) | RESPIRATORY_TRACT | Status: DC
  Administered 2017-04-26: 3 mL via RESPIRATORY_TRACT
  Filled 2017-04-26: qty 3

## 2017-04-26 MED ORDER — VITAMIN B-1 100 MG PO TABS
100.0000 mg | ORAL_TABLET | Freq: Every day | ORAL | Status: DC
  Administered 2017-04-27 – 2017-04-28 (×2): 100 mg via ORAL
  Filled 2017-04-26 (×2): qty 1

## 2017-04-26 NOTE — H&P (Signed)
[name] GXQ:[id number] DOB: 11-Mar-1935 DOA: 04/26/2017     PCP: Janith Lima, MD   Outpatient Specialists:     NEurology  Dr. Rexene Alberts   Patient arrived to ER on 04/26/17 at 1340  Patient coming from:  home Lives With family    Chief Complaint:  Chief Complaint  Patient presents with  . Hypotension  . Nausea  . Emesis    HPI: Vicki Garcia is a 82 y.o. female with medical history significant of memory loss, concerning for dementia, hypertension, hearing impairment  hyperlipidemia, history of melanoma, history of esophagitis   history of low back pain with evidence of lumbar spinal stenosis, and history of diverticulosis   Presented with   onset of altered mental status witnessed by family patient have not had much to drink all day. She was outside talking with social worker when she walked back into the house she reported a headache and collapsed she was possibly out a few min.  Became pale and sweaty lightheaded. Family were frightened she "appeared out cold"  Husband thought she have died. 911 was called fire department arrived on arrival blood pressure was 60 /50 pulse 50 no associated chest pain or shortness of breath patient did have an episode of vomiting and loose stool associated this she was given 500 mL normal saline bolus and blood pressure is improved to 116/28 and she was transferred to [facility] emergency department.  Other than that patient has progressive dementia and have had some problem cooperating with family and eating lately she has had an episode of vomiting 2 days ago but has been eating well today. no associated fevers or chills past 4 days  have had mild cough but  no black stools or blood in stools.     While in ER: She had another episode of hypotension while monitored in ER no changes on EKG to time symptomatic  Following Medications were ordered in ER: Medications  sodium chloride 0.9 % bolus 1,000 mL (0 mLs Intravenous Stopped 04/26/17 1522)     Significant initial  Findings: Abnormal Labs Reviewed  COMPREHENSIVE METABOLIC PANEL - Abnormal; Notable for the following components:      Result Value   Glucose, Bld 101 (*)    BUN 32 (*)    Creatinine, Ser 1.44 (*)    Calcium 8.8 (*)    Total Protein 6.2 (*)    GFR calc non Af Amer 33 (*)    GFR calc Af Amer 39 (*)    All other components within normal limits  CBC - Abnormal; Notable for the following components:   Hemoglobin 11.6 (*)    All other components within normal limits  URINALYSIS, ROUTINE W REFLEX MICROSCOPIC - Abnormal; Notable for the following components:   APPearance HAZY (*)    Hgb urine dipstick SMALL (*)    Leukocytes, UA TRACE (*)    Bacteria, UA RARE (*)    Squamous Epithelial / LPF 0-5 (*)    All other components within normal limits    Na 137 K 3.6  BUN 36 up from baseline   Cr  Up from baseline see below Lab Results  Component Value Date   CREATININE 1.44 (H) 04/26/2017   CREATININE 1.17 03/17/2017   CREATININE 0.85 11/11/2015      WBC 4.3  HG/HCT    Down  from baseline see below    Component Value Date/Time   HGB 11.6 (L) 04/26/2017 1421   HCT 36.0 04/26/2017  1421    Troponin (Point of Care Test) Recent Labs    04/26/17 1437  TROPIPOC 0.01       Lactic Acid, Venous    Component Value Date/Time   LATICACIDVEN 0.73 04/26/2017 1805      UA  no evidence of UTI      CT HEAD   NON acute, 3 cm meningioma right parietal covexity  CXR - COPD non acute    ECG:  Personally reviewed by me showing: HR : 61 Rhythm:  NSR  no evidence of ischemic changes QTC 416      ED Triage Vitals  Enc Vitals Group     BP 04/26/17 1402 (!) 112/48     Pulse Rate 04/26/17 1402 (!) 146     Resp 04/26/17 1415 (!) 21     Temp 04/26/17 1402 98 F (36.7 C)     Temp Source 04/26/17 1402 Oral     SpO2 04/26/17 1402 96 %     Weight 04/26/17 1402 94 lb (42.6 kg)     Height 04/26/17 1402 4\' 10"  (1.473 m)     Head Circumference --       Peak Flow --      Pain Score 04/26/17 1752 0     Pain Loc --      Pain Edu? --      Excl. in [place]? --   TMAX(24)@       Latest  Blood pressure (!) 151/64, pulse (!) 106, temperature 98 F (36.7 C), temperature source Oral, resp. rate (!) 21, height 4\' 10"  (1.473 m), weight 42.6 kg (94 lb), SpO2 96 %.   Hospitalist was called for admission for acute encephalopathy and hypotension.    Review of Systems:    Pertinent positives include:  nausea, vomiting, loss of appetite,  Constitutional:  No weight loss, night sweats, Fevers, chills, fatigue, weight loss  HEENT:  No headaches, Difficulty swallowing,Tooth/dental problems,Sore throat,  No sneezing, itching, ear ache, nasal congestion, post nasal drip,  Cardio-vascular:  No chest pain, Orthopnea, PND, anasarca, dizziness, palpitations.no Bilateral lower extremity swelling  GI:  No heartburn, indigestion, abdominal pain diarrhea, change in bowel habits, melena, blood in stool, hematemesis Resp:  no shortness of breath at rest. No dyspnea on exertion, No excess mucus, no productive cough, No non-productive cough, No coughing up of blood.No change in color of mucus.No wheezing. Skin:  no rash or lesions. No jaundice GU:  no dysuria, change in color of urine, no urgency or frequency. No straining to urinate.  No flank pain.  Musculoskeletal:  No joint pain or no joint swelling. No decreased range of motion. No back pain.  Psych:  No change in mood or affect. No depression or anxiety. No memory loss.  Neuro: no localizing neurological complaints, no tingling, no weakness, no double vision, no gait abnormality, no slurred speech, no confusion  As per HPI otherwise 10 point review of systems negative.   Past Medical History:   Past Medical History:  Diagnosis Date  . Disorder of left rotator cuff   . Diverticulosis of colon    AVM @ colonoscopy 07/2008  . GERD (gastroesophageal reflux disease)   . History of adenomatous polyp of  colon    2010  . History of esophageal dilatation    07/ 2014  . History of esophagitis    07-/ 2014  . History of melanoma excision    1987  right upper arm  . Hyperlipidemia   . Hypertension   .  Prolapsed internal hemorrhoids, grade 4   . Seasonal and perennial allergic rhinitis   . Tinnitus    chronic  . Wears glasses   . Wears hearing aid    bilateral      Past Surgical History:  Procedure Laterality Date  . COLONOSCOPY  last one 07-28-2012  . HEMORRHOID SURGERY N/A 04/18/2015   Procedure: HEMORRHOIDECTOMY;  Surgeon: Leighton Ruff, MD;  Location: [facility];  Service: General;  Laterality: N/A;  . LUMBAR FUSION  11-19-2006   L4 - L5  . UPPER GASTROINTESTINAL ENDOSCOPY  07-28-2012    Social History:  Ambulatory   Independently     reports that she quit smoking about 48 years ago. Her smoking use included cigarettes. She quit after 10.00 years of use. She has never used smokeless tobacco. She reports that she drinks about 3.0 oz of alcohol per week. She reports that she does not use drugs.     Family History:   Family History  Problem Relation Age of Onset  . Skin cancer Brother        also spinal stenosis  . Kidney cancer Brother   . Stomach cancer Brother   . Lung cancer Brother        liver mets  . Coronary artery disease Father   . Hypertension Brother   . Prostate cancer Brother   . Breast cancer Paternal Aunt   . Heart attack Paternal Uncle 17  . Lymphoma Mother        NHL  . Hemolytic uremic syndrome Unknown        granddaughter  . Colon cancer Maternal Uncle   . Ulcers Daughter   . Stroke Neg Hx     Allergies: Allergies  Allergen Reactions  . Codeine Other (See Comments)    REACTION: violently ill with N&V  . Namzaric [Memantine Hcl-Donepezil Hcl] Nausea And Vomiting    Pt reports illness. .  Josem Kaufmann [Amlodipine Besylate] Nausea And Vomiting  . Vasotec Other (See Comments)    cough     Prior to Admission medications    Medication Sig Start Date End Date Taking? Authorizing Provider  citalopram (CELEXA) 10 MG tablet Take 1 tablet (10 mg total) by mouth daily. 12/23/16  Yes Janith Lima, MD  loratadine (CLARITIN) 10 MG tablet Take 10 mg by mouth daily as needed for allergies.   Yes [provider]  losartan-hydrochlorothiazide (HYZAAR) 100-12.5 MG tablet TAKE 1 TABLET ONCE DAILY. 11/25/16  Yes Janith Lima, MD  traMADol (ULTRAM) 50 MG tablet Take one tablet every 4-6 hours prn foot pain. Patient not taking: Reported on 04/26/2017 01/28/17   Edrick Kins, DPM   Physical Exam: Blood pressure (!) 151/64, pulse (!) 106, temperature 98 F (36.7 C), temperature source Oral, resp. rate (!) 21, height 4\' 10"  (1.473 m), weight 42.6 kg (94 lb), SpO2 96 %. 1. General:  in No Acute distress  Chronically ill -appearing 2. Psychological: Alert and   Oriented 3. Head/ENT:     Dry Mucous Membranes                          Head Non traumatic, neck supple                          Poor Dentition 4. SKIN: decreased Skin turgor,  Skin clean Dry and intact no rash 5. Heart: Regular rate and rhythm no Murmur,  no Rub or gallop 6. Lungs:  no wheezes or crackles   7. Abdomen: Soft, non-tender, Non distended   bowel sounds present 8. Lower extremities: no clubbing, cyanosis, or edema 9. Neurologically Grossly intact, moving all 4 extremities equally   10. MSK: Normal range of motion   LABS:     Recent Labs  Lab 04/26/17 1421  WBC 4.3  HGB 11.6*  HCT 36.0  MCV 92.1  PLT 970   Basic Metabolic Panel: Recent Labs  Lab 04/26/17 1421  NA 137  K 3.6  CL 101  CO2 23  GLUCOSE 101*  BUN 32*  CREATININE 1.44*  CALCIUM 8.8*      Recent Labs  Lab 04/26/17 1421  AST 36  ALT 21  ALKPHOS 47  BILITOT 0.6  PROT 6.2*  ALBUMIN 3.6   No results for input(s): LIPASE, AMYLASE in the last 168 hours. No results for input(s): AMMONIA in the last 168 hours.    HbA1C: No results for input(s): HGBA1C in  the last 72 hours. CBG: No results for input(s): GLUCAP in the last 168 hours.    Urine analysis:    Component Value Date/Time   COLORURINE YELLOW 04/26/2017 1614   APPEARANCEUR HAZY (A) 04/26/2017 1614   LABSPEC 1.010 04/26/2017 1614   PHURINE 5.0 04/26/2017 1614   GLUCOSEU NEGATIVE 04/26/2017 1614   GLUCOSEU NEGATIVE 11/11/2015 1058   HGBUR SMALL (A) 04/26/2017 1614   BILIRUBINUR NEGATIVE 04/26/2017 1614   KETONESUR NEGATIVE 04/26/2017 1614   PROTEINUR NEGATIVE 04/26/2017 1614   UROBILINOGEN 0.2 11/11/2015 1058   NITRITE NEGATIVE 04/26/2017 1614   LEUKOCYTESUR TRACE (A) 04/26/2017 1614     Cultures: No results found for: SDES, [place], CULT, REPTSTATUS   Radiological Exams on Admission: Dg Chest 2 View  Result Date: 04/26/2017 CLINICAL DATA:  Syncopal episode and diaphoresis EXAM: CHEST - 2 VIEW COMPARISON:  10/13/2016 FINDINGS: Cardiac shadow is within normal limits. The lungs are hyperinflated consistent with COPD. Degenerative changes of the thoracic spine are noted. No focal infiltrate or sizable effusion is noted. IMPRESSION: COPD without acute abnormality. Electronically Signed   By: Inez Catalina M.D.   On: 04/26/2017 15:26   Ct Head Wo Contrast  Result Date: 04/26/2017 CLINICAL DATA:  Initial evaluation for acute presyncopal symptoms. EXAM: CT HEAD WITHOUT CONTRAST TECHNIQUE: Contiguous axial images were obtained from the base of the skull through the vertex without intravenous contrast. COMPARISON:  Prior MRI from 02/26/2017. FINDINGS: Brain: Diffuse prominence of the CSF containing spaces compatible with generalized age-related cerebral atrophy. Mild chronic microvascular changes present within the periventricular and deep white matter both cerebral hemispheres. No acute intracranial hemorrhage. No acute large vessel territory infarct. 2.9 cm meningioma overlying the right parietal convexity noted without associated mass effect or edema. No other mass lesion. No midline  shift or mass effect. No hydrocephalus. No extra-axial fluid collection. Vascular: No worrisome hyperdense vessel. Scattered vascular calcifications noted within the carotid siphons. Skull: Scalp soft tissues and calvarium within normal limits. Sinuses/Orbits: Globes orbital soft tissues demonstrate no acute abnormality. Punctate calcification noted at the posterior left globe. Paranasal sinuses and mastoid air cells are clear. Other: None. IMPRESSION: 1. No acute intracranial abnormality. 2. Generalized age-related cerebral atrophy with mild chronic small vessel ischemic disease. 3. Approximate 3 cm meningioma overlying the right parietal convexity without associated edema, stable. Electronically Signed   By: Jeannine Boga M.D.   On: 04/26/2017 16:23    Chart has been reviewed    Assessment/Plan  82 y.o. female with medical history significant of memory loss, concerning for dementia, hypertension, hearing impairment  hyperlipidemia, history of melanoma, history of esophagitis   history of low back pain with evidence of lumbar spinal stenosis, and history of diverticulosis Admitted for  syncope and hypotension.   Present on Admission: . Syncope -in the setting of episode of hypotension.  Will admit to telemetry cycle cardiac enzymes obtain echogram and carotid Dopplers check orthostatics rehydrate.  Would benefit from cardiology follow-up given sudden onset.  Most likely secondary to dehydration and decreased p.o. intake . Dehydration rehydrate . AKI (acute kidney injury) ([place]) most likely secondary to dehydration we will check urine electrolytes . Hypotension transient currently resolved to hold home medications . Senile dementia with behavioral disturbance expect some degree of sundowning while hospitalized would benefit from PT OT evaluation prior to discharge.  Safety . Hyperlipidemia LDL goal <160 chronic and stable Cough patient has a history of tobacco abuse chest x-ray showing COPD  but no acute infiltrates no wheezing no increased sputum production has been on it for the past 4 days continue supportive management nebulizer as needed and scheduled DuoNeb for now if does not improve may benefit from course of antibiotics    Other plan as per orders.  DVT prophylaxis:  Lovenox     Code Status:  FULL CODE  as per patient     Family Communication:   Family   at  Bedside  plan of care was discussed with   Husband,   Disposition Plan:        To home once workup is complete and patient is stable                       Would benefit from PT/OT eval prior to DC  ordered                          Consults called: email cards    Admission status:   obs   Level of care     tele          Toy Baker 04/26/2017, 8:14 PM    [facility]  Pager [phone]   after 2 AM please page floor coverage PA If 7AM-7PM, please contact the day team taking care of the patient  [email or web address]  Password [email or web address]

## 2017-04-26 NOTE — ED Notes (Signed)
Pt given sandwich tray and drink, Husband at bedside.

## 2017-04-26 NOTE — ED Triage Notes (Signed)
Pt with [facility]. Family reports that the pt suddenly became sweaty and pale. They state she almost fainted. FDP received bp of 62/30 with pulse of 50. Pt denies LOC, chx pain or SOB. She did have 1 episode of vomiting and loose stool. She was given 500 ml ns bolus. bp now 116/28. A.O

## 2017-04-26 NOTE — ED Notes (Signed)
Pt returning back from radiology.

## 2017-04-26 NOTE — ED Provider Notes (Signed)
[facility] EMERGENCY DEPARTMENT Provider Note   CSN: [id number] Arrival date & time: 04/26/17  1340     History   Chief Complaint Chief Complaint  Patient presents with  . Hypotension  . Nausea  . Emesis    HPI Vicki Garcia is a 82 y.o. female.  82 yo F with a chief complaint of right sided headache and syncope.  This then resolved spontaneously.  Patient was out for a very short time.  She was evaluated by EMS who initially had a blood pressure in the 60s and a heart rate in the 50s which resolved spontaneously.  Patient now feels completely fine.  She denies trouble moving arms or legs.  Had one episode of emesis.  She has been having issues eating at home due to her dementia.  She had one episode of vomiting 2 days ago but has been taking p.o. without issue.  She had a good breakfast this morning per the husband.  Denies fevers or chills has had a cough going on for the past month or so.  No dark stool or blood in her stool.  The history is provided by the patient.  Illness  Associated symptoms include headaches. Pertinent negatives include no chest pain and no shortness of breath.    Past Medical History:  Diagnosis Date  . Disorder of left rotator cuff   . Diverticulosis of colon    AVM @ colonoscopy 07/2008  . GERD (gastroesophageal reflux disease)   . History of adenomatous polyp of colon    2010  . History of esophageal dilatation    07/ 2014  . History of esophagitis    07-/ 2014  . History of melanoma excision    1987  right upper arm  . Hyperlipidemia   . Hypertension   . Prolapsed internal hemorrhoids, grade 4   . Seasonal and perennial allergic rhinitis   . Tinnitus    chronic  . Wears glasses   . Wears hearing aid    bilateral    Patient Active Problem List   Diagnosis Date Noted  . Visit for screening mammogram 11/11/2015  . Estrogen deficiency 11/11/2015  . Senile dementia with behavioral disturbance 10/24/2014  . Benign  esophageal stricture 07/30/2014  . Other abnormal glucose 07/02/2013  . Insomnia 11/02/2012  . Seasonal and perennial allergic rhinitis 10/29/2008  . MELANOMA, UPPER ARM 04/04/2008  . Essential hypertension 02/22/2007  . Hyperlipidemia LDL goal <160 02/18/2007    Past Surgical History:  Procedure Laterality Date  . COLONOSCOPY  last one 07-28-2012  . HEMORRHOID SURGERY N/A 04/18/2015   Procedure: HEMORRHOIDECTOMY;  Surgeon: Leighton Ruff, MD;  Location: [facility];  Service: General;  Laterality: N/A;  . LUMBAR FUSION  11-19-2006   L4 - L5  . UPPER GASTROINTESTINAL ENDOSCOPY  07-28-2012     OB History   None      Home Medications    Prior to Admission medications   Medication Sig Start Date End Date Taking? Authorizing Provider  citalopram (CELEXA) 10 MG tablet Take 1 tablet (10 mg total) by mouth daily. 12/23/16   Janith Lima, MD  losartan-hydrochlorothiazide (HYZAAR) 100-12.5 MG tablet TAKE 1 TABLET ONCE DAILY. 11/25/16   Janith Lima, MD  traMADol (ULTRAM) 50 MG tablet Take one tablet every 4-6 hours prn foot pain. 01/28/17   Edrick Kins, DPM    Family History Family History  Problem Relation Age of Onset  . Skin cancer Brother  also spinal stenosis  . Kidney cancer Brother   . Stomach cancer Brother   . Lung cancer Brother        liver mets  . Coronary artery disease Father   . Hypertension Brother   . Prostate cancer Brother   . Breast cancer Paternal Aunt   . Heart attack Paternal Uncle 36  . Lymphoma Mother        NHL  . Hemolytic uremic syndrome Unknown        granddaughter  . Colon cancer Maternal Uncle   . Ulcers Daughter   . Stroke Neg Hx     Social History Social History   Tobacco Use  . Smoking status: Former Smoker    Years: 10.00    Types: Cigarettes    Last attempt to quit: 01/19/1969    Years since quitting: 48.2  . Smokeless tobacco: Never Used  Substance Use Topics  . Alcohol use: Yes    Alcohol/week:  3.0 oz    Types: 5 Glasses of wine per week    Comment: occasional  . Drug use: No     Allergies   Codeine; Namzaric [memantine hcl-donepezil hcl]; Norvasc [amlodipine besylate]; and Vasotec   Review of Systems Review of Systems  Constitutional: Negative for chills and fever.  HENT: Negative for congestion and rhinorrhea.   Eyes: Negative for redness and visual disturbance.  Respiratory: Positive for cough. Negative for shortness of breath and wheezing.   Cardiovascular: Negative for chest pain and palpitations.  Gastrointestinal: Negative for nausea and vomiting.  Genitourinary: Negative for dysuria and urgency.  Musculoskeletal: Negative for arthralgias and myalgias.  Skin: Negative for pallor and wound.  Neurological: Positive for syncope and headaches. Negative for dizziness.     Physical Exam Updated Vital Signs BP (!) 86/63   Pulse 73   Temp 98 F (36.7 C) (Oral)   Resp (!) 21   Ht 4\' 10"  (1.473 m)   Wt 42.6 kg (94 lb)   SpO2 97%   BMI 19.65 kg/m   Physical Exam  Constitutional: She is oriented to person, place, and time. She appears well-developed and well-nourished. No distress.  HENT:  Head: Normocephalic and atraumatic.  No noted tenderness with palpation of the temporal artery  Eyes: Pupils are equal, round, and reactive to light. EOM are normal.  Neck: Normal range of motion. Neck supple.  Cardiovascular: Normal rate and regular rhythm. Exam reveals no gallop and no friction rub.  No murmur heard. Pulmonary/Chest: Effort normal. She has no wheezes. She has no rales.  Abdominal: Soft. She exhibits no distension. There is no tenderness.  Musculoskeletal: She exhibits no edema or tenderness.  Neurological: She is alert and oriented to person, place, and time. She has normal strength. No cranial nerve deficit. Coordination normal. GCS eye subscore is 4. GCS verbal subscore is 5. GCS motor subscore is 6.  Skin: Skin is warm and dry. She is not diaphoretic.    Psychiatric: She has a normal mood and affect. Her behavior is normal.  Nursing note and vitals reviewed.    ED Treatments / Results  Labs (all labs ordered are listed, but only abnormal results are displayed) Labs Reviewed  COMPREHENSIVE METABOLIC PANEL - Abnormal; Notable for the following components:      Result Value   Glucose, Bld 101 (*)    BUN 32 (*)    Creatinine, Ser 1.44 (*)    Calcium 8.8 (*)    Total Protein 6.2 (*)  GFR calc non Af Amer 33 (*)    GFR calc Af Amer 39 (*)    All other components within normal limits  CBC - Abnormal; Notable for the following components:   Hemoglobin 11.6 (*)    All other components within normal limits  URINALYSIS, ROUTINE W REFLEX MICROSCOPIC  I-STAT TROPONIN, ED  I-STAT CG4 LACTIC ACID, ED    EKG EKG Interpretation  Date/Time:  Monday April 26 2017 14:04:43 EDT Ventricular Rate:  61 PR Interval:    QRS Duration: 87 QT Interval:  413 QTC Calculation: 416 R Axis:   7 Text Interpretation:  Sinus rhythm Anterior infarct, old No significant change since last tracing Confirmed by Deno Etienne [phone]) on 04/26/2017 2:22:25 PM   Radiology Dg Chest 2 View  Result Date: 04/26/2017 CLINICAL DATA:  Syncopal episode and diaphoresis EXAM: CHEST - 2 VIEW COMPARISON:  10/13/2016 FINDINGS: Cardiac shadow is within normal limits. The lungs are hyperinflated consistent with COPD. Degenerative changes of the thoracic spine are noted. No focal infiltrate or sizable effusion is noted. IMPRESSION: COPD without acute abnormality. Electronically Signed   By: Inez Catalina M.D.   On: 04/26/2017 15:26    Procedures Procedures (including critical care time)  Medications Ordered in ED Medications  sodium chloride 0.9 % bolus 1,000 mL (1,000 mLs Intravenous New Bag/Given 04/26/17 1451)     Initial Impression / Assessment and Plan / ED Course  I have reviewed the triage vital signs and the nursing notes.  Pertinent labs & imaging results that were  available during my care of the patient were reviewed by me and considered in my medical decision making (see chart for details).     82 yo F with a chief complaint of headache and a syncopal event.  This happened just prior to arrival.  The patient had a right-sided headache and then suddenly collapsed to the ground.  Family noted that this was very short-lived and then she was back to baseline.    Patient with transient return of low bp in the ED.  No noted arrythmia on tele monitor.  Patient asymptomatic during event.  Mild AKI on labwork.  CXR negative for pna.  Awaiting CT head.   With recurrent hypotension in the ED the patient likely needs to be observed overnight for echo and telemetry monitoring.  Patient was turned over to Dr. Gilford Raid, please see her note for further detail.  The patients results and plan were reviewed and discussed.   Any x-rays performed were independently reviewed by myself.   Differential diagnosis were considered with the presenting HPI.  Medications  sodium chloride 0.9 % bolus 1,000 mL (1,000 mLs Intravenous New Bag/Given 04/26/17 1451)    Vitals:   04/26/17 1402 04/26/17 1415  BP: (!) 112/48 (!) 86/63  Pulse: (!) 146 73  Resp:  (!) 21  Temp: 98 F (36.7 C)   TempSrc: Oral   SpO2: 96% 97%  Weight: 42.6 kg (94 lb)   Height: 4\' 10"  (1.473 m)     Final diagnoses:  Syncope and collapse  Right-sided headache      Final Clinical Impressions(s) / ED Diagnoses   Final diagnoses:  Syncope and collapse  Right-sided headache    ED Discharge Orders    None       Deno Etienne, DO 04/26/17 1602

## 2017-04-26 NOTE — ED Provider Notes (Signed)
Pt signed out by Dr. Tyrone Nine.  She has been asymptomatic since her last hypotensive episode.  She is not orthostatic.  Etiology is still unclear, so she was d/w Dr. Roel Cluck (triad) for observation.   Isla Pence, MD 04/26/17 1859

## 2017-04-26 NOTE — ED Notes (Signed)
Pt has hx of dementia  

## 2017-04-27 ENCOUNTER — Ambulatory Visit (HOSPITAL_BASED_OUTPATIENT_CLINIC_OR_DEPARTMENT_OTHER): Payer: Medicare Other

## 2017-04-27 ENCOUNTER — Observation Stay (HOSPITAL_BASED_OUTPATIENT_CLINIC_OR_DEPARTMENT_OTHER): Payer: Medicare Other

## 2017-04-27 ENCOUNTER — Other Ambulatory Visit: Payer: Self-pay

## 2017-04-27 DIAGNOSIS — E860 Dehydration: Secondary | ICD-10-CM | POA: Diagnosis not present

## 2017-04-27 DIAGNOSIS — I5030 Unspecified diastolic (congestive) heart failure: Secondary | ICD-10-CM | POA: Diagnosis not present

## 2017-04-27 DIAGNOSIS — F0391 Unspecified dementia with behavioral disturbance: Secondary | ICD-10-CM | POA: Diagnosis not present

## 2017-04-27 DIAGNOSIS — R55 Syncope and collapse: Secondary | ICD-10-CM | POA: Diagnosis not present

## 2017-04-27 DIAGNOSIS — N179 Acute kidney failure, unspecified: Secondary | ICD-10-CM | POA: Diagnosis not present

## 2017-04-27 DIAGNOSIS — I9589 Other hypotension: Secondary | ICD-10-CM | POA: Diagnosis not present

## 2017-04-27 DIAGNOSIS — E785 Hyperlipidemia, unspecified: Secondary | ICD-10-CM | POA: Diagnosis not present

## 2017-04-27 DIAGNOSIS — I10 Essential (primary) hypertension: Secondary | ICD-10-CM | POA: Diagnosis not present

## 2017-04-27 LAB — ECHOCARDIOGRAM COMPLETE
Height: 60 in
WEIGHTICAEL: 1728.41 [oz_av]

## 2017-04-27 LAB — COMPREHENSIVE METABOLIC PANEL
ALK PHOS: 39 U/L (ref 38–126)
ALT: 18 U/L (ref 14–54)
AST: 30 U/L (ref 15–41)
Albumin: 3.2 g/dL — ABNORMAL LOW (ref 3.5–5.0)
Anion gap: 7 (ref 5–15)
BILIRUBIN TOTAL: 0.3 mg/dL (ref 0.3–1.2)
BUN: 16 mg/dL (ref 6–20)
CALCIUM: 8.9 mg/dL (ref 8.9–10.3)
CO2: 28 mmol/L (ref 22–32)
CREATININE: 0.88 mg/dL (ref 0.44–1.00)
Chloride: 104 mmol/L (ref 101–111)
GFR calc Af Amer: >60 mL/min (ref >60)
GFR calc non Af Amer: >60 mL/min (ref >60)
GLUCOSE: 110 mg/dL — AB (ref 65–99)
Potassium: 3 mmol/L — ABNORMAL LOW (ref 3.5–5.1)
Sodium: 139 mmol/L (ref 135–145)
TOTAL PROTEIN: 5.5 g/dL — AB (ref 6.5–8.1)

## 2017-04-27 LAB — CBC
HCT: 34.5 % — ABNORMAL LOW (ref 36.0–46.0)
Hemoglobin: 11.2 g/dL — ABNORMAL LOW (ref 12.0–15.0)
MCH: 29.6 pg (ref 26.0–34.0)
MCHC: 32.5 g/dL (ref 30.0–36.0)
MCV: 91 fL (ref 78.0–100.0)
PLATELETS: 159 10*3/uL (ref 150–400)
RBC: 3.79 MIL/uL — ABNORMAL LOW (ref 3.87–5.11)
RDW: 12.6 % (ref 11.5–15.5)
WBC: 2.6 10*3/uL — AB (ref 4.0–10.5)

## 2017-04-27 LAB — MAGNESIUM: MAGNESIUM: 1.9 mg/dL (ref 1.7–2.4)

## 2017-04-27 LAB — HEMOGLOBIN A1C
Hgb A1c MFr Bld: 5.4 % (ref 4.8–5.6)
Mean Plasma Glucose: 108.28 mg/dL

## 2017-04-27 LAB — TROPONIN I

## 2017-04-27 LAB — PHOSPHORUS: Phosphorus: 2.5 mg/dL (ref 2.5–4.6)

## 2017-04-27 LAB — TSH: TSH: 1.726 u[IU]/mL (ref 0.350–4.500)

## 2017-04-27 LAB — CREATININE, URINE, RANDOM: Creatinine, Urine: 90.75 mg/dL

## 2017-04-27 LAB — SODIUM, URINE, RANDOM: SODIUM UR: 56 mmol/L

## 2017-04-27 MED ORDER — ENSURE ENLIVE PO LIQD
237.0000 mL | Freq: Two times a day (BID) | ORAL | Status: DC
  Administered 2017-04-28: 237 mL via ORAL

## 2017-04-27 MED ORDER — DIPHENHYDRAMINE HCL 12.5 MG/5ML PO ELIX
12.5000 mg | ORAL_SOLUTION | Freq: Once | ORAL | Status: AC
  Administered 2017-04-27: 12.5 mg via ORAL
  Filled 2017-04-27: qty 5

## 2017-04-27 MED ORDER — POTASSIUM CHLORIDE CRYS ER 20 MEQ PO TBCR
40.0000 meq | EXTENDED_RELEASE_TABLET | Freq: Once | ORAL | Status: AC
  Administered 2017-04-27: 40 meq via ORAL
  Filled 2017-04-27: qty 2

## 2017-04-27 NOTE — Progress Notes (Signed)
Pt is disoriented & continues to get out of bed. Telesitter not effective. Page to Dr. [name] for notification. Vicki Garcia

## 2017-04-27 NOTE — Progress Notes (Signed)
Nutrition Follow-up  DOCUMENTATION CODES:   Not applicable  INTERVENTION:   Ensure Enlive po BID, each supplement provides 350 kcal and 20 grams of protein  NUTRITION DIAGNOSIS:   Inadequate oral intake related to acute illness as evidenced by meal completion < 50%.  GOAL:   Patient will meet greater than or equal to 90% of their needs  MONITOR:   PO intake, Supplement acceptance, Labs, Weight trends  REASON FOR ASSESSMENT:   Consult Malnutrition Eval  ASSESSMENT:     82 yo female admitted with syncope, AKI possibley due to dehydration. Pt with hx of dementia, HTN, HLD   Recorded po intake 25% of meals on Regular diet  Family at bedside and report UBW of 125 pounds several years ago but had gotten down to 97 pounds. Current wt 108 pounds. No significant recent wt loss per weight encounters.   Reports appetite has been good at home; eating 3-4 meals per day at home but unable to get good diet history from pt or her family. They do report that pt has been told in the past that she might benefit from an Ensure or Boost nutrition supplement.   Wt Readings from Last 10 Encounters:  04/26/17 108 lb 0.4 oz (49 kg)  03/17/17 97 lb (44 kg)  02/18/17 99 lb 8 oz (45.1 kg)  12/23/16 96 lb 8 oz (43.8 kg)  11/25/16 100 lb (45.4 kg)  10/13/16 96 lb (43.5 kg)  09/01/16 98 lb (44.5 kg)  07/15/16 100 lb (45.4 kg)  05/26/16 101 lb (45.8 kg)  05/05/16 103 lb 8 oz (46.9 kg)   Based on current information available, pt does not meet clinical characteristics for malnutrition but is at risk  Labs: reviewed Meds: reviewed  NUTRITION - FOCUSED PHYSICAL EXAM:    Most Recent Value  Orbital Region  No depletion  Upper Arm Region  No depletion  Thoracic and Lumbar Region  Mild depletion  Buccal Region  Mild depletion  Temple Region  No depletion  Clavicle Bone Region  No depletion  Clavicle and Acromion Bone Region  No depletion  Scapular Bone Region  No depletion  Dorsal Hand  No  depletion  Patellar Region  No depletion  Anterior Thigh Region  No depletion  Posterior Calf Region  No depletion  Edema (RD Assessment)  None       Diet Order:  Diet regular Room service appropriate? Yes; Fluid consistency: Thin  EDUCATION NEEDS:   Education needs have been addressed  Skin:  Skin Assessment: Reviewed RN Assessment  Last BM:  4/7  Height:   Ht Readings from Last 1 Encounters:  04/26/17 5' (1.524 m)    Weight:   Wt Readings from Last 1 Encounters:  04/26/17 108 lb 0.4 oz (49 kg)    Ideal Body Weight:     BMI:  Body mass index is 21.1 kg/m.  Estimated Nutritional Needs:   Kcal:  1400-1600 kcals  Protein:  65-75 g  Fluid:  >/= 1.5 L    Kerman Passey MS, RD, LDN, CNSC [phone] Pager  [phone] Weekend/On-Call Pager

## 2017-04-27 NOTE — Evaluation (Signed)
Occupational Therapy Evaluation Patient Details Name: Vicki Garcia MRN: [id number] DOB: 05/04/1935 Today's Date: 04/27/2017    History of Present Illness 82 y.o. female with medical history significant of memory loss, concerning for dementia, hypertension, hearing impairment  hyperlipidemia, history of melanoma, history of esophagitis   history of low back pain with evidence of lumbar spinal stenosis, and history of diverticulosis, admitted due to syncope.   Clinical Impression   Pt with decreased ADL mobility due to syncope and dehydration issues, primarily cognitive deficits posing safety concerns with ADLs and ADL mobility. All education completed and no further acute OT is indicated at this time. Pt to continue with acute OT services for functional mobility safety before d/c home. Pt's spouse available 24/7.  Follow Up Recommendations  No OT follow up;Supervision - Intermittent    Equipment Recommendations  None recommended by OT    Recommendations for Other Services  none     Precautions / Restrictions Precautions Precautions: Fall Restrictions Weight Bearing Restrictions: No      Mobility Bed Mobility Overal bed mobility: Modified Independent                Transfers Overall transfer level: Needs assistance   Transfers: Sit to/from Stand Sit to Stand: Supervision         General transfer comment: for safety    Balance Overall balance assessment: Mild deficits observed, not formally tested                                         ADL either performed or assessed with clinical judgement   ADL Overall ADL's : At baseline;Independent                                             Vision Baseline Vision/History: Wears glasses Wears Glasses: Reading only Patient Visual Report: No change from baseline       Perception     Praxis      Pertinent Vitals/Pain Pain Assessment: No/denies pain     Hand Dominance Right   Extremity/Trunk Assessment Upper Extremity Assessment Upper Extremity Assessment: Overall WFL for tasks assessed   Lower Extremity Assessment Lower Extremity Assessment: Defer to PT evaluation   Cervical / Trunk Assessment Cervical / Trunk Assessment: Normal   Communication Communication Communication: No difficulties   Cognition Arousal/Alertness: Awake/alert Behavior During Therapy: WFL for tasks assessed/performed Overall Cognitive Status: History of cognitive impairments - at baseline                                     General Comments  coughing throughout session and focused on what could be causing her cough repeated herself several times that it is new and very deep.  Able to toilet and wash hands with assist only to locate items at sink including soap and paper towels.    Exercises     Shoulder Instructions      Home Living Family/patient expects to be discharged to:: Private residence Living Arrangements: Spouse/significant other Available Help at Discharge: Family Type of Home: House Home Access: Stairs to enter [employer] of Steps: 2   Home Layout: Able to live on main level with bedroom/bathroom  Bathroom Shower/Tub: Chief Strategy Officer: Environmental consultant - 2 wheels   Additional Comments: doesn't use devices at home, likes to garden  Lives With: Spouse    Prior Functioning/Environment Level of Independence: Needs assistance    ADL's / Homemaking Assistance Needed: independent with bathing, toileting and dressing, spouse assist with IADL's            OT Problem List: Impaired balance (sitting and/or standing);Decreased cognition;Decreased safety awareness      OT Treatment/Interventions:      OT Goals(Current goals can be found in the care plan section) Acute Rehab OT Goals Patient Stated Goal: To return to gardening OT Goal Formulation: With patient/family  OT Frequency:     Barriers to D/C:    no  barriers       Co-evaluation              AM-PAC PT "6 Clicks" Daily Activity     Outcome Measure Help from another person eating meals?: None Help from another person taking care of personal grooming?: None Help from another person toileting, which includes using toliet, bedpan, or urinal?: None Help from another person bathing (including washing, rinsing, drying)?: None Help from another person to put on and taking off regular upper body clothing?: None Help from another person to put on and taking off regular lower body clothing?: None 6 Click Score: 24   End of Session    Activity Tolerance: Patient tolerated treatment well Patient left: in chair;with call bell/phone within reach;with family/visitor present  OT Visit Diagnosis: Other abnormalities of gait and mobility (R26.89)                Time: 4818-5631 OT Time Calculation (min): 24 min Charges:  OT General Charges $OT Visit: 1 Visit OT Evaluation $OT Eval Low Complexity: 1 Low OT Treatments $Therapeutic Activity: 8-22 mins G-Codes: OT G-codes **NOT FOR INPATIENT CLASS** Functional Assessment Tool Used: AM-PAC 6 Clicks Daily Activity     Britt Bottom 04/27/2017, 2:45 PM

## 2017-04-27 NOTE — Progress Notes (Signed)
  Echocardiogram 2D Echocardiogram has been performed.  Vicki Garcia [name] 04/27/2017, 12:59 PM

## 2017-04-27 NOTE — Evaluation (Signed)
Physical Therapy Evaluation Patient Details Name: Vicki Garcia MRN: [id number] DOB: [birth year] Today's Date: 04/27/2017   History of Present Illness  82 y.o. female with medical history significant of memory loss, concerning for dementia, hypertension, hearing impairment  hyperlipidemia, history of melanoma, history of esophagitis   history of low back pain with evidence of lumbar spinal stenosis, and history of diverticulosis, admitted due to syncope.  Clinical Impression  Patient presents with decreased mobility due to illness, but main issue is cognitive deficits posing safety hazard for some IADL/ADL performance.  She may benefit from skilled PT in the acute setting to assist with safety education and family education for compensation for memory issues and to maximize safety at home.  No current follow up PT recommendations.    Follow Up Recommendations No PT follow up    Equipment Recommendations  None recommended by PT    Recommendations for Other Services       Precautions / Restrictions Precautions Precautions: Fall      Mobility  Bed Mobility Overal bed mobility: Modified Independent                Transfers Overall transfer level: Needs assistance   Transfers: Sit to/from Stand Sit to Stand: Supervision         General transfer comment: for safety  Ambulation/Gait Ambulation/Gait assistance: Supervision;Min guard Ambulation Distance (Feet): 250 Feet Assistive device: None Gait Pattern/deviations: Step-through pattern;Decreased stride length;Drifts right/left     General Gait Details: drifts with head turns, but makes turns without difficulty or LOB  Stairs            Wheelchair Mobility    Modified Rankin (Stroke Patients Only)       Balance Overall balance assessment: Mild deficits observed, not formally tested                                           Pertinent Vitals/Pain Pain Assessment: No/denies pain     Home Living Family/patient expects to be discharged to:: Private residence Living Arrangements: Spouse/significant other Available Help at Discharge: Family Type of Home: House Home Access: Stairs to enter   Technical brewer of Steps: 2 Home Layout: Able to live on main level with bedroom/bathroom Home Equipment: Environmental consultant - 2 wheels Additional Comments: doesn't use devices at home, likes to garden    Prior Function Level of Independence: Needs assistance      ADL's / Homemaking Assistance Needed: spouse assist with IADL's        Hand Dominance        Extremity/Trunk Assessment   Upper Extremity Assessment Upper Extremity Assessment: Overall WFL for tasks assessed    Lower Extremity Assessment Lower Extremity Assessment: Overall WFL for tasks assessed       Communication   Communication: No difficulties  Cognition Arousal/Alertness: Awake/alert Behavior During Therapy: WFL for tasks assessed/performed Overall Cognitive Status: History of cognitive impairments - at baseline                                        General Comments General comments (skin integrity, edema, etc.): coughing throughout session and focused on what could be causing her cough repeated herself several times that it is new and very deep.  Able to toilet and wash hands with assist only to  locate items at sink including soap and paper towels.    Exercises     Assessment/Plan    PT Assessment Patient needs continued PT services  PT Problem List Decreased mobility;Decreased safety awareness;Decreased cognition       PT Treatment Interventions Therapeutic activities;Cognitive remediation;Gait training;Stair training    PT Goals (Current goals can be found in the Care Plan section)  Acute Rehab PT Goals Patient Stated Goal: To return to gardening PT Goal Formulation: With patient/family Time For Goal Achievement: 05/04/17 Potential to Achieve Goals: Fair     Frequency Min 3X/week   Barriers to discharge        Co-evaluation               AM-PAC PT "6 Clicks" Daily Activity  Outcome Measure Difficulty turning over in bed (including adjusting bedclothes, sheets and blankets)?: A Little Difficulty moving from lying on back to sitting on the side of the bed? : A Little Difficulty sitting down on and standing up from a chair with arms (e.g., wheelchair, bedside commode, etc,.)?: A Little Help needed moving to and from a bed to chair (including a wheelchair)?: A Little Help needed walking in hospital room?: A Little Help needed climbing 3-5 steps with a railing? : A Little 6 Click Score: 18    End of Session Equipment Utilized During Treatment: Gait belt Activity Tolerance: Patient tolerated treatment well Patient left: in bed;with call bell/phone within reach;with family/visitor present   PT Visit Diagnosis: Other abnormalities of gait and mobility (R26.89)    Time: 1245-8099 PT Time Calculation (min) (ACUTE ONLY): 28 min   Charges:   PT Evaluation $PT Eval Low Complexity: 1 Low PT Treatments $Gait Training: 8-22 mins   PT G CodesMagda Kiel, [place] [phone] 04/27/2017   Reginia Naas 04/27/2017, 2:26 PM

## 2017-04-27 NOTE — Care Management Obs Status (Signed)
[place] NOTIFICATION   Patient Details  Name: Shaira Sova MRN: [id number] Date of Birth: Mar 18, 1935   Medicare Observation Status Notification Given:  Yes    Kristen Cardinal, RN 04/27/2017, 5:09 PM

## 2017-04-27 NOTE — Progress Notes (Signed)
*  PRELIMINARY RESULTS* Vascular Ultrasound Carotid Duplex (Doppler) has been completed. Findings suggest 1-39% internal carotid artery stenosis bilaterally. Vertebral arteries are patent with antegrade flow.  04/27/2017 12:16 PM Maudry Mayhew, BS, RVT, RDCS, RDMS

## 2017-04-27 NOTE — Discharge Instructions (Signed)
[facility] Stay Proper nutrition can help your body recover from illness and injury.   Foods and beverages high in protein, vitamins, and minerals help rebuild muscle loss, promote healing, & reduce fall risk.   In addition to eating healthy foods, a nutrition shake is an easy, delicious way to get the nutrition you need during and after your hospital stay  It is recommended that you continue to drink 1-2 bottles per day of:       Ensure or Boost for at least 1 month (30 days) after your hospital stay   Tips for adding a nutrition shake into your routine: As allowed, drink one with vitamins or medications instead of water or juice Enjoy one as a tasty mid-morning or afternoon snack Drink cold or make a milkshake or smoothie out of it Drink one instead of milk with cereal or snacks Use as a coffee creamer   Available at the following grocery stores and pharmacies:           * [place] [phone]            For COUPONS visit: www.ensure.com/join or http://dawson-may.com/   Suggested Substitutions Ensure=Boost=Equate=Carnation Instant Breakfast Essentials Ensure Plus = Boost Plus = Carnation Breakfast Essentials = Boost Compact Ensure Active Clear = Boost Breeze Glucerna Shake = Boost Glucose Control = Carnation Breakfast Essentials SUGAR FREE

## 2017-04-27 NOTE — Evaluation (Signed)
Speech Language Pathology Evaluation Patient Details Name: Addalynn Kumari MRN: [id number] DOB: [birth year] Today's Date: 04/27/2017 Time: 3500-9381 SLP Time Calculation (min) (ACUTE ONLY): 15 min  Problem List:  Patient Active Problem List   Diagnosis Date Noted  . Dehydration 04/26/2017  . AKI (acute kidney injury) ([place]) 04/26/2017  . Hypotension 04/26/2017  . Syncope 04/26/2017  . Visit for screening mammogram 11/11/2015  . Estrogen deficiency 11/11/2015  . Senile dementia with behavioral disturbance 10/24/2014  . Benign esophageal stricture 07/30/2014  . Other abnormal glucose 07/02/2013  . Insomnia 11/02/2012  . Seasonal and perennial allergic rhinitis 10/29/2008  . MELANOMA, UPPER ARM 04/04/2008  . Essential hypertension 02/22/2007  . Hyperlipidemia LDL goal <160 02/18/2007   Past Medical History:  Past Medical History:  Diagnosis Date  . Disorder of left rotator cuff   . Diverticulosis of colon    AVM @ colonoscopy 07/2008  . GERD (gastroesophageal reflux disease)   . History of adenomatous polyp of colon    2010  . History of esophageal dilatation    07/ 2014  . History of esophagitis    07-/ 2014  . History of melanoma excision    1987  right upper arm  . Hyperlipidemia   . Hypertension   . Prolapsed internal hemorrhoids, grade 4   . Seasonal and perennial allergic rhinitis   . Tinnitus    chronic  . Wears glasses   . Wears hearing aid    bilateral   Past Surgical History:  Past Surgical History:  Procedure Laterality Date  . COLONOSCOPY  last one 07-28-2012  . HEMORRHOID SURGERY N/A 04/18/2015   Procedure: HEMORRHOIDECTOMY;  Surgeon: Leighton Ruff, MD;  [facility];  Service: General;  Laterality: N/A;  . LUMBAR FUSION  11-19-2006   L4 - L5  . UPPER GASTROINTESTINAL ENDOSCOPY  07-28-2012   HPI:  Avneet Ashmore a 82 y.o.femalewith medical history significant of memory loss, concerning for dementia,hypertension, hearing  impairment hyperlipidemia, melanoma, esophagitis, low back pain with evidence of lumbar spinal stenosis, and diverticulosis.Presented withonset of altered mental status, reported a headache and collapsed. CT No acute intracranial abnormality, generalized age-related cerebral atrophy with mild chronic small vessel ischemic disease, approximate 3 cm meningioma overlying the right parietal convexity without associated edema, stable (seen during MRI 02/2017).   Assessment / Plan / Recommendation Clinical Impression  Spouse reports pt with baseline dementia/memory impairments and is not responsible for management of finances or medicine. She stays physically active by gardening and cleaning and reports she likes to read. She is currently receiving home health ST for cognition. Pt oriented to month and needed minimal prompt for year; awareness of deficits appears mod-significantly decreased and needs continued 24 hour supervision. Educated pt and husband that intervention for dementia typically involves providing/maintaining cognitive stimulation: use of and access to calendars to keep track of appointments, reading recipes and cooking with supervision, reading, problem solving during activities/grocery shopping etc. Will defer continued treatment to home health ST.       SLP Assessment  SLP Recommendation/Assessment: All further Speech Lanaguage Pathology  needs can be addressed in the next venue of care SLP Visit Diagnosis: Cognitive communication deficit (R41.841)    Follow Up Recommendations  Other (comment)(continue outpatient ST)    Frequency and Duration           SLP Evaluation Cognition  Overall Cognitive Status: History of cognitive impairments - at baseline Arousal/Alertness: Awake/alert Orientation Level: Oriented to person;Oriented to place;Other (comment)(month and with min  prompt year) Attention: Sustained Sustained Attention: Impaired Sustained Attention Impairment: Verbal  basic Memory: Impaired Awareness: Impaired Awareness Impairment: Intellectual impairment;Emergent impairment;Anticipatory impairment Problem Solving: Impaired Problem Solving Impairment: Verbal basic Safety/Judgment: Impaired       Comprehension  Auditory Comprehension Overall Auditory Comprehension: Appears within functional limits for tasks assessed Interfering Components: Attention;Working Curator: Not tested Reading Comprehension Reading Status: Not tested    Expression Expression Primary Mode of Expression: Verbal Verbal Expression Overall Verbal Expression: Appears within functional limits for tasks assessed Pragmatics: No impairment Written Expression Written Expression: Not tested   Oral / Motor  Oral Motor/Sensory Function Overall Oral Motor/Sensory Function: Within functional limits Motor Speech Overall Motor Speech: Appears within functional limits for tasks assessed Intelligibility: Intelligible Motor Planning: Witnin functional limits   GO                    [name] 04/27/2017, 11:44 AM   Orbie Pyo Colvin Caroli.Ed [employer] [phone]

## 2017-04-27 NOTE — Care Management Note (Addendum)
Case Management Note  Patient Details  Name: Vicki Garcia MRN: [id number] Date of Birth: Nov 07, 1935  Subjective/Objective:     History of memory loss, concerning for dementia, HTN,  hearing impairment  hyperlipidemia, melanoma, esophagitis, low back pain w/ evidence of lumbar spinal stenosis, diverticulosis; Admitted due to syncope.            Action/Plan: Prior to admission patient lived at home with spouse.  Home DME: [facility] - 2 wheels  Expected Discharge Date:    To Be Determined              Expected Discharge Plan:   To Be Determined  In-House Referral:  Nutrition  Discharge planning Services  CM Consult  Post Acute Care Choice:    Choice offered to:     DME Arranged:    DME Agency:     HH Arranged:    HH Agency:     Status of Service:  In process, will continue to follow  If discussed at Long Length of Stay Meetings, dates discussed:    Additional Comments:  Kristen Cardinal, RN 04/27/2017, 3:33 PM

## 2017-04-27 NOTE — Progress Notes (Signed)
PROGRESS NOTE    Vicki Garcia  KKX:[id number] DOB: 1935/05/24 DOA: 04/26/2017 PCP: Janith Lima, MD   Brief Narrative:  82 y.o. female with medical history significant of memory loss, concerning for dementia, hypertension, hearing impairment  hyperlipidemia, history of melanoma, history of esophagitis   history of low back pain with evidence of lumbar spinal stenosis, and history of diverticulosis  Admitted for syncopal episode. Pending workup.  Assessment & Plan   Syncope -In the setting of hypotension (BP 86/63) -CT head showed no acute or cranial nerve normality.  Approximately 3 cm meningioma overlying the right parietal convexity without associated edema, stable -CXR and UA unremarkable for infection -Echocardiogram pending -Carotid Doppler: 1-39% ICA stenosis bilaterally.  Vertebral arteries are patent with antegrade flow -Orthostatic vitals -TSH checked, 1.726 (WNL) -Orthostatic vitals obtained, +drop in SBP upon standing   Acute kidney injury -possibly due to dehydration -Creatinine upon admission was 1.44, currently 0.88 -Continue to monitor BMP  Hypokalemia -Will replace and continue to monitor -Magnesium 1.9  Transient hypotension -hyzaar held  Senile dementia -stable; patient states someone works with her 2 times per week regarding her memory  Upper respiratory infection with cough -Prior to admission, patient has had cough with sputum production -Chest x-ray reviewed showing COPD.  No infection patient without history of COPD. -Currently afebrile, no leukocytosis   DVT Prophylaxis  Lovenox  Code Status: Full  Family Communication: None at bedside  Disposition Plan: observation, pending workup   Consultants None  Procedures  Carotid doppler Echocardiogram  Antibiotics   Anti-infectives (From admission, onward)   None      Subjective:   Vicki Garcia seen and examined today.  Denies dizziness, chest pain, shortness of breath, abdominal  pain, nausea, vomiting, diarrhea, constipation. States she cannot recall the events that occurred yesterday. Does admit to have some type of cold and cough over the past few days.    Objective:   Vitals:   04/26/17 2112 04/27/17 0633 04/27/17 0827 04/27/17 0830  BP:  (!) 120/54 (!) 102/50 (!) 102/50  Pulse:  73 68 68  Resp:   16 16  Temp:  98.9 F (37.2 C) 99 F (37.2 C) 99 F (37.2 C)  TempSrc:  Oral Oral Oral  SpO2:  98% 97% 97%  Weight: 49 kg (108 lb 0.4 oz)     Height: 5' (1.524 m)       Intake/Output Summary (Last 24 hours) at 04/27/2017 1346 Last data filed at 04/27/2017 0900 Gross per 24 hour  Intake 1876.67 ml  Output 0 ml  Net 1876.67 ml   Filed Weights   04/26/17 1402 04/26/17 2112  Weight: 42.6 kg (94 lb) 49 kg (108 lb 0.4 oz)    Exam  General: Well developed, elderly, thin, NAD  HEENT: NCAT, PERRLA, EOMI, Anicteic Sclera, mucous membranes moist.   Neck: Supple  Cardiovascular: S1 S2 auscultated, RRR  Respiratory: Clear to auscultation bilaterally with equal chest rise  Abdomen: Soft, nontender, nondistended, + bowel sounds  Extremities: warm dry without cyanosis clubbing or edema  Neuro: AAOx3 (self, place, situation, but not time or current events),  nonfocal  Psych: Normal affect and demeanor with intact judgement and insight   Data Reviewed: I have personally reviewed following labs and imaging studies  CBC: Recent Labs  Lab 04/26/17 1421 04/27/17 0957  WBC 4.3 2.6*  HGB 11.6* 11.2*  HCT 36.0 34.5*  MCV 92.1 91.0  PLT 158 169   Basic Metabolic Panel: Recent Labs  Lab 04/26/17  1421 04/26/17 2017 04/27/17 0957  NA 137  --  139  K 3.6  --  3.0*  CL 101  --  104  CO2 23  --  28  GLUCOSE 101*  --  110*  BUN 32*  --  16  CREATININE 1.44*  --  0.88  CALCIUM 8.8*  --  8.9  MG  --  2.1 1.9  PHOS  --  3.0 2.5   GFR: Estimated Creatinine Clearance: 36.6 mL/min (by C-G formula based on SCr of 0.88 mg/dL). Liver Function Tests: Recent  Labs  Lab 04/26/17 1421 04/27/17 0957  AST 36 30  ALT 21 18  ALKPHOS 47 39  BILITOT 0.6 0.3  PROT 6.2* 5.5*  ALBUMIN 3.6 3.2*   No results for input(s): LIPASE, AMYLASE in the last 168 hours. No results for input(s): AMMONIA in the last 168 hours. Coagulation Profile: No results for input(s): INR, PROTIME in the last 168 hours. Cardiac Enzymes: Recent Labs  Lab 04/26/17 2017 04/27/17 0957  TROPONINI <0.03 <0.03   BNP (last 3 results) No results for input(s): PROBNP in the last 8760 hours. HbA1C: Recent Labs    04/27/17 0957  HGBA1C 5.4   CBG: No results for input(s): GLUCAP in the last 168 hours. Lipid Profile: No results for input(s): CHOL, HDL, LDLCALC, TRIG, CHOLHDL, LDLDIRECT in the last 72 hours. Thyroid Function Tests: Recent Labs    04/27/17 0957  TSH 1.726   Anemia Panel: Recent Labs    04/26/17 2017  VITAMINB12 412  FOLATE 25.5  FERRITIN 93  TIBC 274  IRON 15*  RETICCTPCT 0.9   Urine analysis:    Component Value Date/Time   COLORURINE YELLOW 04/26/2017 1614   APPEARANCEUR HAZY (A) 04/26/2017 1614   LABSPEC 1.010 04/26/2017 1614   PHURINE 5.0 04/26/2017 1614   GLUCOSEU NEGATIVE 04/26/2017 1614   GLUCOSEU NEGATIVE 11/11/2015 1058   HGBUR SMALL (A) 04/26/2017 1614   BILIRUBINUR NEGATIVE 04/26/2017 1614   KETONESUR NEGATIVE 04/26/2017 1614   PROTEINUR NEGATIVE 04/26/2017 1614   UROBILINOGEN 0.2 11/11/2015 1058   NITRITE NEGATIVE 04/26/2017 1614   LEUKOCYTESUR TRACE (A) 04/26/2017 1614   Sepsis Labs: @LABRCNTIP (procalcitonin:4,lacticidven:4)  )No results found for this or any previous visit (from the past 240 hour(s)).    Radiology Studies: Dg Chest 2 View  Result Date: 04/26/2017 CLINICAL DATA:  Syncopal episode and diaphoresis EXAM: CHEST - 2 VIEW COMPARISON:  10/13/2016 FINDINGS: Cardiac shadow is within normal limits. The lungs are hyperinflated consistent with COPD. Degenerative changes of the thoracic spine are noted. No focal  infiltrate or sizable effusion is noted. IMPRESSION: COPD without acute abnormality. Electronically Signed   By: Inez Catalina M.D.   On: 04/26/2017 15:26   Ct Head Wo Contrast  Result Date: 04/26/2017 CLINICAL DATA:  Initial evaluation for acute presyncopal symptoms. EXAM: CT HEAD WITHOUT CONTRAST TECHNIQUE: Contiguous axial images were obtained from the base of the skull through the vertex without intravenous contrast. COMPARISON:  Prior MRI from 02/26/2017. FINDINGS: Brain: Diffuse prominence of the CSF containing spaces compatible with generalized age-related cerebral atrophy. Mild chronic microvascular changes present within the periventricular and deep white matter both cerebral hemispheres. No acute intracranial hemorrhage. No acute large vessel territory infarct. 2.9 cm meningioma overlying the right parietal convexity noted without associated mass effect or edema. No other mass lesion. No midline shift or mass effect. No hydrocephalus. No extra-axial fluid collection. Vascular: No worrisome hyperdense vessel. Scattered vascular calcifications noted within the carotid siphons. Skull: Scalp  soft tissues and calvarium within normal limits. Sinuses/Orbits: Globes orbital soft tissues demonstrate no acute abnormality. Punctate calcification noted at the posterior left globe. Paranasal sinuses and mastoid air cells are clear. Other: None. IMPRESSION: 1. No acute intracranial abnormality. 2. Generalized age-related cerebral atrophy with mild chronic small vessel ischemic disease. 3. Approximate 3 cm meningioma overlying the right parietal convexity without associated edema, stable. Electronically Signed   By: Jeannine Boga M.D.   On: 04/26/2017 16:23     Scheduled Meds: . citalopram  10 mg Oral Daily  . enoxaparin (LOVENOX) injection  30 mg Subcutaneous Q24H  . feeding supplement (ENSURE ENLIVE)  237 mL Oral BID BM  . guaiFENesin  600 mg Oral BID  . thiamine  100 mg Oral Daily   Continuous  Infusions:   LOS: 0 days   Time Spent in minutes   30 minutes  Bergen Magner D.O. on 04/27/2017 at 1:46 PM  Between 7am to 7pm - Pager - [phone]  After 7pm go to [email or web address] - password [email or web address]  And look for the night coverage person covering for me after hours  [facility] Office  [phone]

## 2017-04-28 ENCOUNTER — Telehealth: Payer: Self-pay | Admitting: *Deleted

## 2017-04-28 DIAGNOSIS — N179 Acute kidney failure, unspecified: Secondary | ICD-10-CM

## 2017-04-28 DIAGNOSIS — E860 Dehydration: Secondary | ICD-10-CM

## 2017-04-28 DIAGNOSIS — I10 Essential (primary) hypertension: Secondary | ICD-10-CM | POA: Diagnosis not present

## 2017-04-28 DIAGNOSIS — F0391 Unspecified dementia with behavioral disturbance: Secondary | ICD-10-CM | POA: Diagnosis not present

## 2017-04-28 DIAGNOSIS — R55 Syncope and collapse: Secondary | ICD-10-CM

## 2017-04-28 LAB — CBC
HCT: 31.7 % — ABNORMAL LOW (ref 36.0–46.0)
Hemoglobin: 10.4 g/dL — ABNORMAL LOW (ref 12.0–15.0)
MCH: 29.8 pg (ref 26.0–34.0)
MCHC: 32.8 g/dL (ref 30.0–36.0)
MCV: 90.8 fL (ref 78.0–100.0)
PLATELETS: 151 10*3/uL (ref 150–400)
RBC: 3.49 MIL/uL — AB (ref 3.87–5.11)
RDW: 12.6 % (ref 11.5–15.5)
WBC: 3.2 10*3/uL — AB (ref 4.0–10.5)

## 2017-04-28 LAB — BASIC METABOLIC PANEL
ANION GAP: 10 (ref 5–15)
BUN: 10 mg/dL (ref 6–20)
CO2: 25 mmol/L (ref 22–32)
Calcium: 8.6 mg/dL — ABNORMAL LOW (ref 8.9–10.3)
Chloride: 101 mmol/L (ref 101–111)
Creatinine, Ser: 0.8 mg/dL (ref 0.44–1.00)
GFR calc non Af Amer: >60 mL/min (ref >60)
Glucose, Bld: 103 mg/dL — ABNORMAL HIGH (ref 65–99)
POTASSIUM: 3.5 mmol/L (ref 3.5–5.1)
SODIUM: 136 mmol/L (ref 135–145)

## 2017-04-28 MED ORDER — ENSURE ENLIVE PO LIQD: 237.0000 mL | Freq: Two times a day (BID) | ORAL | 12 refills | Status: DC

## 2017-04-28 MED ORDER — THIAMINE HCL 100 MG PO TABS: 100.0000 mg | ORAL_TABLET | Freq: Every day | ORAL | 0 refills | Status: DC

## 2017-04-28 MED ORDER — GUAIFENESIN ER 600 MG PO TB12: 600.0000 mg | ORAL_TABLET | Freq: Two times a day (BID) | ORAL | 0 refills | Status: DC

## 2017-04-28 NOTE — Progress Notes (Signed)
Pt. discharged to home. Pt after visit summary reviewed with spouse. He is capable of re verbalizing medications and follow up appointments. Pt remains stable. No signs and symptoms of distress. Educated to return to ER in the event of SOB, dizziness, chest pain, or fainting. Mady Gemma, RN

## 2017-04-28 NOTE — Progress Notes (Signed)
Physical Therapy Treatment Patient Details Name: Vicki Garcia MRN: [id number] DOB: 04/02/1935 Today's Date: 04/28/2017    History of Present Illness Pt is a 82 y.o. female with PMH significant of memory loss (concerning for dementia), HTN, hearing impairment, lumbar spinal stenosis, admitted 04/26/17 due to syncope. Worked up for hypotension, dehydration, and AKI.   PT Comments    No family/caregiver present this session for education on safety. Pt able to ambulate 400' and initiate stair training with supervision for safety. Asymptomatic throughout session (see BP values below). From a mobility perspective, feel pt is safe to return home with 24/7 supervision from husband. Although pt moving very well, feel pt will consistently require 24/7 support due to cognitive deficits, including decreased safety awareness.  Sitting BP 127/62 Standing BP 125/60 Standing 3 minutes BP 127/57   Follow Up Recommendations  No PT follow up;Supervision/Assistance - 24 hour     Equipment Recommendations  None recommended by PT    Recommendations for Other Services       Precautions / Restrictions Precautions Precautions: Fall Precaution Comments: Flight risk Restrictions Weight Bearing Restrictions: No    Mobility  Bed Mobility               General bed mobility comments: Received sitting EOB   Transfers Overall transfer level: Needs assistance   Transfers: Sit to/from Stand Sit to Stand: Supervision            Ambulation/Gait Ambulation/Gait assistance: Supervision Ambulation Distance (Feet): 400 Feet Assistive device: None Gait Pattern/deviations: Step-through pattern;Decreased stride length Gait velocity: Decreased Gait velocity interpretation: Below normal speed for age/gender General Gait Details: Slow, controlled amb with no DME and supervision for safety. Cues for safety throughout (i.e. pt attempting to enter another pt's room), but no overt LOB or physical assist  required   Stairs Stairs: Yes   Stair Management: Alternating pattern;One rail Right;Forwards Number of Stairs: 4 General stair comments: Simulated ascending stairs by high marching with single UE support; supervision for safety  Wheelchair Mobility    Modified Rankin (Stroke Patients Only)       Balance Overall balance assessment: Mild deficits observed, not formally tested                                          Cognition Arousal/Alertness: Awake/alert Behavior During Therapy: WFL for tasks assessed/performed Overall Cognitive Status: History of cognitive impairments - at baseline Area of Impairment: Orientation;Attention;Memory;Following commands;Safety/judgement;Awareness;Problem solving                 Orientation Level: Disoriented to;Place;Situation;Time Current Attention Level: Sustained Memory: Decreased short-term memory Following Commands: Follows one step commands inconsistently Safety/Judgement: Decreased awareness of safety;Decreased awareness of deficits Awareness: Intellectual Problem Solving: Difficulty sequencing;Requires verbal cues        Exercises      General Comments        Pertinent Vitals/Pain Pain Assessment: No/denies pain    Home Living                      Prior Function            PT Goals (current goals can now be found in the care plan section) Acute Rehab PT Goals Patient Stated Goal: To leave with husband Vicki Garcia PT Goal Formulation: With patient/family Time For Goal Achievement: 05/04/17 Potential to Achieve Goals: Fair Progress towards PT goals:  Progressing toward goals    Frequency    Min 3X/week      PT Plan Current plan remains appropriate    Co-evaluation              AM-PAC PT "6 Clicks" Daily Activity  Outcome Measure  Difficulty turning over in bed (including adjusting bedclothes, sheets and blankets)?: None Difficulty moving from lying on back to sitting on  the side of the bed? : None Difficulty sitting down on and standing up from a chair with arms (e.g., wheelchair, bedside commode, etc,.)?: A Little Help needed moving to and from a bed to chair (including a wheelchair)?: A Little Help needed walking in hospital room?: A Little Help needed climbing 3-5 steps with a railing? : A Little 6 Click Score: 20    End of Session Equipment Utilized During Treatment: Gait belt Activity Tolerance: Patient tolerated treatment well Patient left: in bed;with call bell/phone within reach;with nursing/sitter in room Nurse Communication: Mobility status PT Visit Diagnosis: Other abnormalities of gait and mobility (R26.89)     Time: 2703-5009 PT Time Calculation (min) (ACUTE ONLY): 21 min  Charges:  $Gait Training: 8-22 mins                    G Codes:      Vicki Garcia, PT, DPT Acute Rehab Services  Pager: [place] 04/28/2017, 9:50 AM

## 2017-04-28 NOTE — Telephone Encounter (Signed)
Transition Care Management Follow-up Telephone Call   Date discharged? 04/28/17   How have you been since you were released from the hospital? Spoke w/husband he states wife is doing fine.. Just came home not too long ago and she is eating   Do you understand why you were in the hospital? YES   Do you understand the discharge instructions? YES   Where were you discharged to? Home   Items Reviewed:  Medications reviewed: YES  Allergies reviewed: YES  Dietary changes reviewed: YES, heart healthy  Referrals reviewed: YES   Functional Questionnaire:   Activities of Daily Living (ADLs):   He states she are independent in the following: bathing and hygiene, feeding, continence, grooming, toileting and dressing States they require assistance with the following: ambulation   Any transportation issues/concerns?: NO   Any patient concerns? NO   Confirmed importance and date/time of follow-up visits scheduled YES, appt 05/05/17  Provider Appointment booked with Dr. Ronnald Ramp  Confirmed with patient if condition begins to worsen call PCP or go to the ER.  Patient was given the office number and encouraged to call back with question or concerns.  : YES

## 2017-04-28 NOTE — Discharge Summary (Signed)
Physician Discharge Summary  Vicki Garcia YNW:[id number] DOB: Apr 24, 1935 DOA: 04/26/2017  PCP: Janith Lima, MD  Admit date: 04/26/2017 Discharge date: 04/28/2017  Admitted From: Home Disposition:  Home  Recommendations for Outpatient Follow-up:  1. Follow up with PCP in 1 week with repeat CBC/BMP 2. Patient will benefit from outpatient evaluation by cardiology   Home Health: No Equipment/Devices: None  Discharge Condition: Stable CODE STATUS: Full Diet recommendation: Heart Healthy  Brief/Interim Summary: 82 year old female with history of memory loss concerning for dementia, hypertension, hearing impairment, hyperlipidemia, history of melanoma, history of esophagitis, low back pain with evidence of lumbar spinal stenosis and diverticulosis resented with hypotension and syncope.  She was treated with intravenous fluids.  Antihypertensives were initially held.  Echo and carotid ultrasound was unremarkable.  Patient condition improved.  She will be discharged home and may benefit from outpatient evaluation and follow-up with cardiology.  Discharge Diagnoses:  Active Problems:   Hyperlipidemia LDL goal <160   Essential hypertension   Senile dementia with behavioral disturbance   Dehydration   AKI (acute kidney injury) ([place])   Hypotension   Syncope  Syncope -Probably from dehydration. -CT head showed no acute or cranial nerve normality.  Approximately 3 cm meningioma overlying the right parietal convexity without associated edema, stable -CXR and UA unremarkable for infection -Echocardiogram showed EF of 60-65% with grade 1 diastolic dysfunction -Carotid Doppler: 1-39% ICA stenosis bilaterally.  Vertebral arteries are patent with antegrade flow -Condition is improved.  Patient wants to go home.  Discharge home with outpatient evaluation and follow-up with cardiology  Acute kidney injury -possibly due to dehydration -Improved.  Hypokalemia -Replaced.  Improved  Transient  hypotension -hyzaar held initially. -Blood pressure improved.  Resume Hyzaar  dementia -stable; outpatient follow-up  Upper respiratory infection with cough -Prior to admission, patient has had cough with sputum production -Chest x-ray reviewed showing COPD.  No infection patient without history of COPD. -Currently afebrile, no leukocytosis     Discharge Instructions  Discharge Instructions    Ambulatory referral to Cardiology   Complete by:  As directed    Syncope   Call MD for:  difficulty breathing, headache or visual disturbances   Complete by:  As directed    Call MD for:  extreme fatigue   Complete by:  As directed    Call MD for:  hives   Complete by:  As directed    Call MD for:  persistant dizziness or light-headedness   Complete by:  As directed    Call MD for:  persistant nausea and vomiting   Complete by:  As directed    Call MD for:  severe uncontrolled pain   Complete by:  As directed    Call MD for:  temperature >100.4   Complete by:  As directed    Diet - low sodium heart healthy   Complete by:  As directed    Increase activity slowly   Complete by:  As directed      Allergies as of 04/28/2017      Reactions   Codeine Other (See Comments)   REACTION: violently ill with N&V   Namzaric [memantine Hcl-donepezil Hcl] Nausea And Vomiting   Pt reports illness. .   Norvasc [amlodipine Besylate] Nausea And Vomiting   Vasotec Other (See Comments)   cough      Medication List    STOP taking these medications   traMADol 50 MG tablet Commonly known as:  ULTRAM     TAKE these  medications   citalopram 10 MG tablet Commonly known as:  CELEXA Take 1 tablet (10 mg total) by mouth daily.   feeding supplement (ENSURE ENLIVE) Liqd Take 237 mLs by mouth 2 (two) times daily between meals.   guaiFENesin 600 MG 12 hr tablet Commonly known as:  MUCINEX Take 1 tablet (600 mg total) by mouth 2 (two) times daily.   loratadine 10 MG tablet Commonly known as:   CLARITIN Take 10 mg by mouth daily as needed for allergies.   losartan-hydrochlorothiazide 100-12.5 MG tablet Commonly known as:  HYZAAR TAKE 1 TABLET ONCE DAILY.   thiamine 100 MG tablet Take 1 tablet (100 mg total) by mouth daily. Start taking on:  04/29/2017      Follow-up Information    Janith Lima, MD. Schedule an appointment as soon as possible for a visit in 1 week(s).   Specialty:  Internal Medicine Why:  with repeat CBC/BMP Contact information: [address] [place] [address] [phone]          Allergies  Allergen Reactions  . Codeine Other (See Comments)    REACTION: violently ill with N&V  . Namzaric [Memantine Hcl-Donepezil Hcl] Nausea And Vomiting    Pt reports illness. .  Josem Kaufmann [Amlodipine Besylate] Nausea And Vomiting  . Vasotec Other (See Comments)    cough    Consultations:  None   Procedures/Studies: Dg Chest 2 View  Result Date: 04/26/2017 CLINICAL DATA:  Syncopal episode and diaphoresis EXAM: CHEST - 2 VIEW COMPARISON:  10/13/2016 FINDINGS: Cardiac shadow is within normal limits. The lungs are hyperinflated consistent with COPD. Degenerative changes of the thoracic spine are noted. No focal infiltrate or sizable effusion is noted. IMPRESSION: COPD without acute abnormality. Electronically Signed   By: Inez Catalina M.D.   On: 04/26/2017 15:26   Ct Head Wo Contrast  Result Date: 04/26/2017 CLINICAL DATA:  Initial evaluation for acute presyncopal symptoms. EXAM: CT HEAD WITHOUT CONTRAST TECHNIQUE: Contiguous axial images were obtained from the base of the skull through the vertex without intravenous contrast. COMPARISON:  Prior MRI from 02/26/2017. FINDINGS: Brain: Diffuse prominence of the CSF containing spaces compatible with generalized age-related cerebral atrophy. Mild chronic microvascular changes present within the periventricular and deep white matter both cerebral hemispheres. No acute intracranial hemorrhage. No  acute large vessel territory infarct. 2.9 cm meningioma overlying the right parietal convexity noted without associated mass effect or edema. No other mass lesion. No midline shift or mass effect. No hydrocephalus. No extra-axial fluid collection. Vascular: No worrisome hyperdense vessel. Scattered vascular calcifications noted within the carotid siphons. Skull: Scalp soft tissues and calvarium within normal limits. Sinuses/Orbits: Globes orbital soft tissues demonstrate no acute abnormality. Punctate calcification noted at the posterior left globe. Paranasal sinuses and mastoid air cells are clear. Other: None. IMPRESSION: 1. No acute intracranial abnormality. 2. Generalized age-related cerebral atrophy with mild chronic small vessel ischemic disease. 3. Approximate 3 cm meningioma overlying the right parietal convexity without associated edema, stable. Electronically Signed   By: Jeannine Boga M.D.   On: 04/26/2017 16:23    Echo on 04/27/2017: Study Conclusions  - Left ventricle: The cavity size was normal. Wall thickness was   normal. Systolic function was normal. The estimated ejection   fraction was in the range of 60% to 65%. Wall motion was normal;   there were no regional wall motion abnormalities. Doppler   parameters are consistent with abnormal left ventricular   relaxation (grade 1 diastolic dysfunction).  The E/e&' ratio is   >15, suggesting elevated LV filling pressure. - Mitral valve: Mildly thickened leaflets . There was trivial   regurgitation. - Left atrium: The atrium was normal in size. - Tricuspid valve: There was trivial regurgitation. - Pulmonary arteries: PA peak pressure: 23 mm Hg (S). - Inferior vena cava: The vessel was normal in size. The   respirophasic diameter changes were in the normal range (>= 50%),   consistent with normal central venous pressure.  Impressions:  - LVEF 60-65%, normal wall thickness, normal wall motion, grade 1   DD, elevated LV  filling pressure, trivial MR, normal LA size,   trivial TR, RVSP 23 mmHg, normal IVC.  Bilateral carotid ultrasound on 04/27/2017 Final Interpretation: Right Carotid: Velocities in the right ICA are consistent with a 1-39% stenosis.  Left Carotid: Velocities in the left ICA are consistent with a 1-39% stenosis.  Vertebrals: Bilateral vertebral arteries demonstrate antegrade flow. Subclavians: Normal flow hemodynamics were seen in bilateral subclavian       arteries.   Subjective: Patient seen and examined at bedside.  She denies any overnight fever, nausea or vomiting.  She wants to go home.  Discharge Exam: Vitals:   04/28/17 0603 04/28/17 0818  BP: 137/75 139/62  Pulse: 72 67  Resp:  18  Temp: 99.7 F (37.6 C) 98.1 F (36.7 C)  SpO2: 98% 100%   Vitals:   04/27/17 1614 04/27/17 2143 04/28/17 0603 04/28/17 0818  BP: 127/64 (!) 145/65 137/75 139/62  Pulse: 75 71 72 67  Resp: 18   18  Temp: 97.7 F (36.5 C) 99.6 F (37.6 C) 99.7 F (37.6 C) 98.1 F (36.7 C)  TempSrc: Oral Oral Oral Oral  SpO2: 100% 100% 98% 100%  Weight:      Height:        General: Pt is alert, awake, not in acute distress Cardiovascular: Rate controlled, S1/S2 + Respiratory: Bilateral decreased breath sounds at bases  abdominal: Soft, NT, ND, bowel sounds + Extremities: no edema, no cyanosis    The results of significant diagnostics from this hospitalization (including imaging, microbiology, ancillary and laboratory) are listed below for reference.     Microbiology: No results found for this or any previous visit (from the past 240 hour(s)).   Labs: BNP (last 3 results) No results for input(s): BNP in the last 8760 hours. Basic Metabolic Panel: Recent Labs  Lab 04/26/17 1421 04/26/17 2017 04/27/17 0957 04/28/17 0423  NA 137  --  139 136  K 3.6  --  3.0* 3.5  CL 101  --  104 101  CO2 23  --  28 25  GLUCOSE 101*  --  110* 103*  BUN 32*  --  16 10  CREATININE 1.44*  --   0.88 0.80  CALCIUM 8.8*  --  8.9 8.6*  MG  --  2.1 1.9  --   PHOS  --  3.0 2.5  --    Liver Function Tests: Recent Labs  Lab 04/26/17 1421 04/27/17 0957  AST 36 30  ALT 21 18  ALKPHOS 47 39  BILITOT 0.6 0.3  PROT 6.2* 5.5*  ALBUMIN 3.6 3.2*   No results for input(s): LIPASE, AMYLASE in the last 168 hours. No results for input(s): AMMONIA in the last 168 hours. CBC: Recent Labs  Lab 04/26/17 1421 04/27/17 0957 04/28/17 0423  WBC 4.3 2.6* 3.2*  HGB 11.6* 11.2* 10.4*  HCT 36.0 34.5* 31.7*  MCV 92.1 91.0 90.8  PLT 158  159 151   Cardiac Enzymes: Recent Labs  Lab 04/26/17 2017 04/27/17 0957  TROPONINI <0.03 <0.03   BNP: Invalid input(s): POCBNP CBG: No results for input(s): GLUCAP in the last 168 hours. D-Dimer Recent Labs    04/26/17 2017  DDIMER 0.40   Hgb A1c Recent Labs    04/27/17 0957  HGBA1C 5.4   Lipid Profile No results for input(s): CHOL, HDL, LDLCALC, TRIG, CHOLHDL, LDLDIRECT in the last 72 hours. Thyroid function studies Recent Labs    04/27/17 0957  TSH 1.726   Anemia work up Recent Labs    04/26/17 2017  VITAMINB12 412  FOLATE 25.5  FERRITIN 93  TIBC 274  IRON 15*  RETICCTPCT 0.9   Urinalysis    Component Value Date/Time   COLORURINE YELLOW 04/26/2017 1614   APPEARANCEUR HAZY (A) 04/26/2017 1614   LABSPEC 1.010 04/26/2017 1614   PHURINE 5.0 04/26/2017 1614   GLUCOSEU NEGATIVE 04/26/2017 1614   GLUCOSEU NEGATIVE 11/11/2015 1058   HGBUR SMALL (A) 04/26/2017 1614   BILIRUBINUR NEGATIVE 04/26/2017 1614   KETONESUR NEGATIVE 04/26/2017 1614   PROTEINUR NEGATIVE 04/26/2017 1614   UROBILINOGEN 0.2 11/11/2015 1058   NITRITE NEGATIVE 04/26/2017 1614   LEUKOCYTESUR TRACE (A) 04/26/2017 1614   Sepsis Labs Invalid input(s): PROCALCITONIN,  WBC,  LACTICIDVEN Microbiology No results found for this or any previous visit (from the past 240 hour(s)).   Time coordinating discharge: 35 minutes  SIGNED:   Aline August,  MD  [facility] 04/28/2017, 1:04 PM Pager: [phone]  If 7PM-7AM, please contact night-coverage [email or web address] Password [email or web address]

## 2017-04-29 ENCOUNTER — Telehealth: Payer: Self-pay | Admitting: Internal Medicine

## 2017-04-29 NOTE — Telephone Encounter (Signed)
Copied from [place] [id number]. Topic: Inquiry >> Apr 29, 2017  1:36 PM Lennox Solders wrote: Reason for CRM: vanessa speech therapist from [facility] is calling requesting verbal order for speech therapy once a wk for 3 wks. Pt was in er from observation and pt missed some speech therapy visit

## 2017-04-30 NOTE — Telephone Encounter (Signed)
Routing to dr jones, please advise, thanks 

## 2017-05-03 NOTE — Telephone Encounter (Signed)
Ok with me 

## 2017-05-04 NOTE — Telephone Encounter (Signed)
Vanessa/[facility] advised of dr Ronnald Ramp orders

## 2017-05-05 ENCOUNTER — Encounter: Payer: Self-pay | Admitting: Internal Medicine

## 2017-05-05 ENCOUNTER — Ambulatory Visit: Payer: Medicare Other | Admitting: Internal Medicine

## 2017-05-05 VITALS — BP 132/70 | HR 80 | Temp 98.2°F | Resp 16 | Ht 60.0 in | Wt 97.8 lb

## 2017-05-05 DIAGNOSIS — I10 Essential (primary) hypertension: Secondary | ICD-10-CM | POA: Diagnosis not present

## 2017-05-05 DIAGNOSIS — R053 Chronic cough: Secondary | ICD-10-CM

## 2017-05-05 DIAGNOSIS — D508 Other iron deficiency anemias: Secondary | ICD-10-CM | POA: Diagnosis not present

## 2017-05-05 DIAGNOSIS — R05 Cough: Secondary | ICD-10-CM

## 2017-05-05 MED ORDER — DEXTROMETHORPHAN POLISTIREX ER 30 MG/5ML PO SUER: 15.0000 mg | Freq: Two times a day (BID) | ORAL | 5 refills | Status: DC

## 2017-05-05 MED ORDER — FERROUS SULFATE 325 (65 FE) MG PO TABS: 325.0000 mg | ORAL_TABLET | Freq: Two times a day (BID) | ORAL | 1 refills | Status: DC

## 2017-05-05 NOTE — Progress Notes (Signed)
Subjective:  Patient ID: Vicki Garcia, female    DOB: Nov 16, 1935  Age: 82 y.o. MRN: [id number]  CC: Hypertension; Anemia; and Cough   HPI Gale Klar presents for f/up - She was recently seen in the emergency department after a syncopal episode.  She was found to be dehydrated with an acute kidney injury and iron deficiency anemia.  Her husband tells me that she has recently been forgetting to drink and eat.  She was admitted and given IV fluids.  She had significant improvement.  He tells me that her appetite and oral intake has been good for the last week.  She suffers from a chronic, nonproductive cough.  Someone else prescribed guaifenesin but she says is not helping.  She denies odynophagia, dysphagia, abdominal pain, loss of appetite, melena, or bright red blood per rectum.  Outpatient Medications Prior to Visit  Medication Sig Dispense Refill  . citalopram (CELEXA) 10 MG tablet Take 1 tablet (10 mg total) by mouth daily. 90 tablet 1  . feeding supplement, ENSURE ENLIVE, (ENSURE ENLIVE) LIQD Take 237 mLs by mouth 2 (two) times daily between meals. 237 mL 12  . guaiFENesin (MUCINEX) 600 MG 12 hr tablet Take 1 tablet (600 mg total) by mouth 2 (two) times daily. 20 tablet 0  . loratadine (CLARITIN) 10 MG tablet Take 10 mg by mouth daily as needed for allergies.    Marland Kitchen losartan-hydrochlorothiazide (HYZAAR) 100-12.5 MG tablet TAKE 1 TABLET ONCE DAILY. 90 tablet 1  . thiamine 100 MG tablet Take 1 tablet (100 mg total) by mouth daily. 30 tablet 0   No facility-administered medications prior to visit.     ROS Review of Systems  Constitutional: Negative.  Negative for activity change, appetite change, diaphoresis and fatigue.  HENT: Negative.  Negative for facial swelling, sinus pressure and sore throat.   Eyes: Negative for visual disturbance.  Respiratory: Positive for cough. Negative for chest tightness, shortness of breath, wheezing and stridor.   Cardiovascular: Negative for chest pain,  palpitations and leg swelling.  Gastrointestinal: Negative for abdominal pain, blood in stool, constipation, diarrhea, nausea and vomiting.  Endocrine: Negative.   Genitourinary: Negative.  Negative for difficulty urinating.  Musculoskeletal: Negative.  Negative for back pain and myalgias.  Skin: Negative.  Negative for color change and rash.  Neurological: Negative.  Negative for dizziness, weakness and light-headedness.  Hematological: Negative for adenopathy. Does not bruise/bleed easily.  Psychiatric/Behavioral: Positive for behavioral problems, confusion and decreased concentration. Negative for agitation, dysphoric mood, self-injury and sleep disturbance. The patient is not nervous/anxious.     Objective:  BP 132/70   Pulse 80   Temp 98.2 F (36.8 C) (Oral)   Resp 16   Ht 5' (1.524 m)   Wt 97 lb 12 oz (44.3 kg)   SpO2 98%   BMI 19.09 kg/m   BP Readings from Last 3 Encounters:  05/05/17 132/70  04/28/17 139/62  03/17/17 (!) 148/70    Wt Readings from Last 3 Encounters:  05/05/17 97 lb 12 oz (44.3 kg)  04/26/17 108 lb 0.4 oz (49 kg)  03/17/17 97 lb (44 kg)    Physical Exam  Constitutional: She is oriented to person, place, and time.  Non-toxic appearance. She does not have a sickly appearance. She does not appear ill. No distress. She is not intubated.  HENT:  Mouth/Throat: Oropharynx is clear and moist. No oropharyngeal exudate.  Eyes: Conjunctivae are normal. No scleral icterus.  Neck: Normal range of motion. Neck supple. No JVD  present. No thyromegaly present.  Cardiovascular: Normal rate, regular rhythm and normal heart sounds. Exam reveals no gallop and no friction rub.  No murmur heard. Pulmonary/Chest: Effort normal. No accessory muscle usage or stridor. No apnea, no tachypnea and no bradypnea. She is not intubated. No respiratory distress. She has no decreased breath sounds. She has no wheezes. She has rhonchi in the right lower field and the left lower field.  She has no rales.  Abdominal: Soft. Bowel sounds are normal. She exhibits no distension and no mass. There is no tenderness.  Musculoskeletal: Normal range of motion. She exhibits no edema, tenderness or deformity.  Lymphadenopathy:    She has no cervical adenopathy.  Neurological: She is alert and oriented to person, place, and time.  Skin: Skin is warm and dry. She is not diaphoretic.  Psychiatric: Her speech is normal. Judgment and thought content normal. Her mood appears anxious. Her affect is not angry. She is slowed. She is not withdrawn and not combative. Thought content is not paranoid. Cognition and memory are impaired. She does not exhibit a depressed mood. She exhibits abnormal recent memory and abnormal remote memory. She is inattentive.  Vitals reviewed.   Lab Results  Component Value Date   WBC 3.2 (L) 04/28/2017   HGB 10.4 (L) 04/28/2017   HCT 31.7 (L) 04/28/2017   PLT 151 04/28/2017   GLUCOSE 103 (H) 04/28/2017   CHOL 206 (H) 03/17/2017   TRIG 76.0 03/17/2017   HDL 89.10 03/17/2017   LDLCALC 102 (H) 03/17/2017   ALT 18 04/27/2017   AST 30 04/27/2017   NA 136 04/28/2017   K 3.5 04/28/2017   CL 101 04/28/2017   CREATININE 0.80 04/28/2017   BUN 10 04/28/2017   CO2 25 04/28/2017   TSH 1.726 04/27/2017   HGBA1C 5.4 04/27/2017    Dg Chest 2 View  Result Date: 04/26/2017 CLINICAL DATA:  Syncopal episode and diaphoresis EXAM: CHEST - 2 VIEW COMPARISON:  10/13/2016 FINDINGS: Cardiac shadow is within normal limits. The lungs are hyperinflated consistent with COPD. Degenerative changes of the thoracic spine are noted. No focal infiltrate or sizable effusion is noted. IMPRESSION: COPD without acute abnormality. Electronically Signed   By: Inez Catalina M.D.   On: 04/26/2017 15:26   Ct Head Wo Contrast  Result Date: 04/26/2017 CLINICAL DATA:  Initial evaluation for acute presyncopal symptoms. EXAM: CT HEAD WITHOUT CONTRAST TECHNIQUE: Contiguous axial images were obtained from  the base of the skull through the vertex without intravenous contrast. COMPARISON:  Prior MRI from 02/26/2017. FINDINGS: Brain: Diffuse prominence of the CSF containing spaces compatible with generalized age-related cerebral atrophy. Mild chronic microvascular changes present within the periventricular and deep white matter both cerebral hemispheres. No acute intracranial hemorrhage. No acute large vessel territory infarct. 2.9 cm meningioma overlying the right parietal convexity noted without associated mass effect or edema. No other mass lesion. No midline shift or mass effect. No hydrocephalus. No extra-axial fluid collection. Vascular: No worrisome hyperdense vessel. Scattered vascular calcifications noted within the carotid siphons. Skull: Scalp soft tissues and calvarium within normal limits. Sinuses/Orbits: Globes orbital soft tissues demonstrate no acute abnormality. Punctate calcification noted at the posterior left globe. Paranasal sinuses and mastoid air cells are clear. Other: None. IMPRESSION: 1. No acute intracranial abnormality. 2. Generalized age-related cerebral atrophy with mild chronic small vessel ischemic disease. 3. Approximate 3 cm meningioma overlying the right parietal convexity without associated edema, stable. Electronically Signed   By: Jeannine Boga M.D.   On:  04/26/2017 16:23    Assessment & Plan:   Sheritha was seen today for hypertension, anemia and cough.  Diagnoses and all orders for this visit:  Iron deficiency anemia due to dietary causes- This is most likely related to decreased intake of nutritious food.  She has no sources of blood loss.  Will start iron replacement therapy. -     ferrous sulfate 325 (65 FE) MG tablet; Take 1 tablet (325 mg total) by mouth 2 (two) times daily with a meal.  Essential - Her blood pressure is normal now.  She is not hypotensive and her renal function has normalized.  Chronic cough-I have asked her to take dextromethorphan twice  a day to suppress the cough. -     dextromethorphan (DELSYM) 30 MG/5ML liquid; Take 2.5 mLs (15 mg total) by mouth 2 (two) times daily.   I am having Vicki Garcia start on ferrous sulfate and dextromethorphan. I am also having her maintain her losartan-hydrochlorothiazide, citalopram, loratadine, feeding supplement (ENSURE ENLIVE), thiamine, and guaiFENesin.  Meds ordered this encounter  Medications  . ferrous sulfate 325 (65 FE) MG tablet    Sig: Take 1 tablet (325 mg total) by mouth 2 (two) times daily with a meal.    Dispense:  180 tablet    Refill:  1  . dextromethorphan (DELSYM) 30 MG/5ML liquid    Sig: Take 2.5 mLs (15 mg total) by mouth 2 (two) times daily.    Dispense:  148 mL    Refill:  5     Follow-up: Return in about 3 months (around 08/04/2017).  Scarlette Calico, MD

## 2017-05-05 NOTE — Patient Instructions (Signed)
Iron Deficiency Anemia, Adult Iron deficiency anemia is a condition in which the concentration of red blood cells or hemoglobin in the blood is below normal because of too little iron. Hemoglobin is a substance in red blood cells that carries oxygen to the body's tissues. When the concentration of red blood cells or hemoglobin is too low, not enough oxygen reaches these tissues. Iron deficiency anemia is usually long-lasting (chronic) and it develops over time. It may or may not cause symptoms. It is a common type of anemia. What are the causes? This condition may be caused by:  Not enough iron in the diet.  Blood loss caused by bleeding in the intestine.  Blood loss from a gastrointestinal condition like Crohn disease.  Frequent blood draws, such as from blood donation.  Abnormal absorption in the gut.  Heavy menstrual periods in women.  Cancers of the gastrointestinal system, such as colon cancer.  What are the signs or symptoms? Symptoms of this condition may include:  Fatigue.  Headache.  Pale skin, lips, and nail beds.  Poor appetite.  Weakness.  Shortness of breath.  Dizziness.  Cold hands and feet.  Fast or irregular heartbeat.  Irritability. This is more common in severe anemia.  Rapid breathing. This is more common in severe anemia.  Mild anemia may not cause any symptoms. How is this diagnosed? This condition is diagnosed based on:  Your medical history.  A physical exam.  Blood tests.  You may have additional tests to find the underlying cause of your anemia, such as:  Testing for blood in the stool (fecal occult blood test).  A procedure to see inside your colon and rectum (colonoscopy).  A procedure to see inside your esophagus and stomach (endoscopy).  A test in which cells are removed from bone marrow (bone marrow aspiration) or fluid is removed from the bone marrow to be examined (biopsy). This is rarely needed.  How is this  treated? This condition is treated by correcting the cause of your iron deficiency. Treatment may involve:  Adding iron-rich foods to your diet.  Taking iron supplements. If you are pregnant or breastfeeding, you may need to take extra iron because your normal diet usually does not provide the amount of iron that you need.  Increasing vitamin C intake. Vitamin C helps your body absorb iron. Your health care provider may recommend that you take iron supplements along with a glass of orange juice or a vitamin C supplement.  Medicines to make heavy menstrual flow lighter.  Surgery.  You may need repeat blood tests to determine whether treatment is working. Depending on the underlying cause, the anemia should be corrected within 2 months of starting treatment. If the treatment does not seem to be working, you may need more testing. Follow these instructions at home: Medicines  Take over-the-counter and prescription medicines only as told by your health care provider. This includes iron supplements and vitamins.  If you cannot tolerate taking iron supplements by mouth, talk with your health care provider about taking them through a vein (intravenously) or an injection into a muscle.  For the best iron absorption, you should take iron supplements when your stomach is empty. If you cannot tolerate them on an empty stomach, you may need to take them with food.  Do not drink milk or take antacids at the same time as your iron supplements. Milk and antacids may interfere with iron absorption.  Iron supplements can cause constipation. To prevent constipation, include fiber   in your diet as told by your health care provider. A stool softener may also be recommended. Eating and drinking  Talk with your health care provider before changing your diet. He or she may recommend that you eat foods that contain a lot of iron, such as: ? Liver. ? Low-fat (lean) beef. ? Breads and cereals that have iron  added to them (are fortified). ? Eggs. ? Dried fruit. ? Dark green, leafy vegetables.  To help your body use the iron from iron-rich foods, eat those foods at the same time as fresh fruits and vegetables that are high in vitamin C. Foods that are high in vitamin C include: ? Oranges. ? Peppers. ? Tomatoes. ? Mangoes.  Drinkenoughfluid to keep your urine clear or pale yellow. General instructions  Return to your normal activities as told by your health care provider. Ask your health care provider what activities are safe for you.  Practice good hygiene. Anemia can make you more prone to illness and infection.  Keep all follow-up visits as told by your health care provider. This is important. Contact a health care provider if:  You feel nauseous or you vomit.  You feel weak.  You have unexplained sweating.  You develop symptoms of constipation, such as: ? Having fewer than three bowel movements a week. ? Straining to have a bowel movement. ? Having stools that are hard, dry, or larger than normal. ? Feeling full or bloated. ? Pain in the lower abdomen. ? Not feeling relief after having a bowel movement. Get help right away if:  You faint. If this happens, do not drive yourself to the hospital. Call your local emergency services (911 in the [place]  You have chest pain.  You have shortness of breath that: ? Is severe. ? Gets worse with physical activity.  You have a rapid heartbeat.  You become light-headed when getting up from a sitting or lying down position. This information is not intended to replace advice given to you by your health care provider. Make sure you discuss any questions you have with your health care provider. Document Released: 01/03/2000 Document Revised: 09/25/2015 Document Reviewed: 09/25/2015 Elsevier Interactive Patient Education  2018 [employer]  

## 2017-05-18 ENCOUNTER — Telehealth: Payer: Self-pay

## 2017-05-18 NOTE — Telephone Encounter (Signed)
FYI Pt husband(on DPR) called to inform they will bring pt on tomorrow @ 11 for 11:30 appointment no call back requested

## 2017-05-18 NOTE — Telephone Encounter (Signed)
I called pt's daughter, Langley Gauss, per DPR, per Dr. Guadelupe Sabin request, to offer pt a later appt following her neuro psych testing, rather than following up tomorrow. Pt's last neuro psychappt is 08/05/17, pt could be rescheduled with Dr. Rexene Alberts after that date. It is also ok for pt to keep the appt tomorrow with Dr. Rexene Alberts if the pt and her family have concerns they wish to address. Pt's daughter will speak with pt's husband, and let us know what they prefer to do regarding tomorrow's appt.

## 2017-05-19 ENCOUNTER — Encounter: Payer: Self-pay | Admitting: Neurology

## 2017-05-19 ENCOUNTER — Ambulatory Visit: Payer: Medicare Other | Admitting: Neurology

## 2017-05-19 VITALS — BP 162/75 | HR 70 | Ht 60.0 in | Wt 97.0 lb

## 2017-05-19 DIAGNOSIS — R413 Other amnesia: Secondary | ICD-10-CM

## 2017-05-19 DIAGNOSIS — R634 Abnormal weight loss: Secondary | ICD-10-CM | POA: Diagnosis not present

## 2017-05-19 NOTE — Progress Notes (Signed)
Subjective:    Patient ID: Vicki Garcia is a 82 y.o. female.  HPI     Interim history:   Vicki Garcia is a 82 year old right-handed woman with an underlying medical history of hypertension, hearing impairment with hearing aids, tinnitus, seasonal allergies, hyperlipidemia, history of melanoma, history of esophagitis and reflux disease, history of low back pain with evidence of lumbar spinal stenosis, and history of diverticulosis, who presents for follow-up consultation of Vicki memory loss. The patient is accompanied by Vicki Garcia again today. I first met Vicki on 02/18/2017 at the request of Vicki primary care physician, at which time Vicki Garcia reported a several year history of memory loss with progression. Vicki MMSE was 15 out of 30 at the time. I suggested further workup in the form of brain MRI and neuropsychological evaluation. She had a brain MRI without contrast on 02/26/2017 and I reviewed the results: IMPRESSION:  This MRI of the brain without contrast shows the following: 1.    2117 mm extra-axial mass most consistent with a meningioma.The mass is hyperintense to brain on T2-weighted images and diffusion weighted images and may represent a less common angiomatous or secretory meningioma.    There is no adjacent mass effect implying a slow growth or stability. If clinically indicated, consider repeat imaging with contrast. 2.   Moderate generalized cortical atrophy that is most severe in the mesial temporal lobes. 3.   Mild chronic microvascular ischemic change.   We called Vicki Garcia with the test results. I did suggest we could proceed with an MRI with contrast for better clarification and also that we would probably want to repeat Vicki brain MRI down the Road to monitor for stability of Vicki meningioma/lesion.  She is scheduled for neuropsychological testing in June and a follow-up in early July.  Today, 05/19/2017: She reports doing well. She does not have a great appetite. Tries to  hydrate. Weight a little less. Of note, she was recently hospitalized after a syncopal event. She was found to be hypotensive and dehydrated. She improved after IV hydration. She was not eating and drinking very well. She also had a complication of acute kidney injury. I reviewed Vicki hospital records from Vicki admission from 04/26/2017 through 04/28/2017.  The patient's allergies, current medications, family history, past medical history, past social history, past surgical history and problem list were reviewed and updated as appropriate.   Previously (copied from previous notes for reference):   02/18/2017: (She) reports very little, states, that she was brought here under false pretenses and would not answer any questions. She does admit, that Vicki memory has been slipping. Vicki Garcia reports that she got lost driving about a year ago. I reviewed your office note from 12/23/2016. She has previously declined neurology appointments. She has been referred to hospice/palliative care, they may have had a couple of visit, maybe a SW. They were supposed to have a Nurse visit.  She is currently not on any memory medication. She apparently has an allergy to Namzaric, but Vicki Garcia and patient are not aware of Vicki trying it of if she did, what reaction she had. She may have tried it in 2017. She has not had any brain MRI or head CT. She does not have a family history of dementia. She had 2 brothers, only one is alive. She lives with Vicki Garcia. 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 She went to [employer] college and had a business major in the 34s. They  have 2 grown daughters, one lives in [place], the other in [place]. She is a nonsmoker, drinks alcohol about 3-4 times a week, a small glass of wine typically, caffeine in the form of coffee, usually one cup per day on average.   Vicki Past Medical History Is Significant For: Past Medical History:  Diagnosis Date  . Disorder  of left rotator cuff   . Diverticulosis of colon    AVM @ colonoscopy 07/2008  . GERD (gastroesophageal reflux disease)   . History of adenomatous polyp of colon    2010  . History of esophageal dilatation    07/ 2014  . History of esophagitis    07-/ 2014  . History of melanoma excision    1987  right upper arm  . Hyperlipidemia   . Hypertension   . Prolapsed internal hemorrhoids, grade 4   . Seasonal and perennial allergic rhinitis   . Tinnitus    chronic  . Wears glasses   . Wears hearing aid    bilateral    Vicki Past Surgical History Is Significant For: Past Surgical History:  Procedure Laterality Date  . COLONOSCOPY  last one 07-28-2012  . HEMORRHOID SURGERY N/A 04/18/2015   Procedure: HEMORRHOIDECTOMY;  Surgeon: Leighton Ruff, MD;  Location: [facility];  Service: General;  Laterality: N/A;  . LUMBAR FUSION  11-19-2006   L4 - L5  . UPPER GASTROINTESTINAL ENDOSCOPY  07-28-2012    Vicki Family History Is Significant For: Family History  Problem Relation Age of Onset  . Skin cancer Brother        also spinal stenosis  . Kidney cancer Brother   . Stomach cancer Brother   . Lung cancer Brother        liver mets  . Coronary artery disease Father   . Hypertension Brother   . Prostate cancer Brother   . Breast cancer Paternal Aunt   . Heart attack Paternal Uncle 57  . Lymphoma Mother        NHL  . Hemolytic uremic syndrome Unknown        granddaughter  . Colon cancer Maternal Uncle   . Ulcers Daughter   . Stroke Neg Hx     Vicki Social History Is Significant For: Social History   Socioeconomic History  . Marital status: Married    Spouse name: Not on file  . Number of children: Not on file  . Years of education: Not on file  . Highest education level: Not on file  Occupational History  . Not on file  Social Needs  . Financial resource strain: Not on file  . Food insecurity:    Worry: Not on file    Inability: Not on file  . Transportation  needs:    Medical: Not on file    Non-medical: Not on file  Tobacco Use  . Smoking status: Former Smoker    Years: 10.00    Types: Cigarettes    Last attempt to quit: 01/19/1969    Years since quitting: 48.3  . Smokeless tobacco: Never Used  Substance and Sexual Activity  . Alcohol use: Yes    Alcohol/week: 3.0 oz    Types: 5 Glasses of wine per week    Comment: occasional  . Drug use: No  . Sexual activity: Not on file    Comment: G1  P1  M1  Lifestyle  . Physical activity:    Days per week: Not on file    Minutes per  session: Not on file  . Stress: Not on file  Relationships  . Social connections:    Talks on phone: Not on file    Gets together: Not on file    Attends religious service: Not on file    Active member of club or organization: Not on file    Attends meetings of clubs or organizations: Not on file    Relationship status: Not on file  Other Topics Concern  . Not on file  Social History Narrative   Regular exercise: Strength Training & walking w/o symptoms          Vicki Allergies Are:  Allergies  Allergen Reactions  . Codeine Other (See Comments)    REACTION: violently ill with N&V  . Namzaric [Memantine Hcl-Donepezil Hcl] Nausea And Vomiting    Pt reports illness. .  Vicki Garcia [Amlodipine Besylate] Nausea And Vomiting  . Vasotec Other (See Comments)    cough  :   Vicki Current Medications Are:  Outpatient Encounter Medications as of 05/19/2017  Medication Sig  . citalopram (CELEXA) 10 MG tablet Take 1 tablet (10 mg total) by mouth daily.  Marland Kitchen dextromethorphan (DELSYM) 30 MG/5ML liquid Take 2.5 mLs (15 mg total) by mouth 2 (two) times daily.  . feeding supplement, ENSURE ENLIVE, (ENSURE ENLIVE) LIQD Take 237 mLs by mouth 2 (two) times daily between meals.  . ferrous sulfate 325 (65 FE) MG tablet Take 1 tablet (325 mg total) by mouth 2 (two) times daily with a meal.  . guaiFENesin (MUCINEX) 600 MG 12 hr tablet Take 1 tablet (600 mg total) by mouth 2 (two)  times daily.  Marland Kitchen loratadine (CLARITIN) 10 MG tablet Take 10 mg by mouth daily as needed for allergies.  Marland Kitchen losartan-hydrochlorothiazide (HYZAAR) 100-12.5 MG tablet TAKE 1 TABLET ONCE DAILY.  Marland Kitchen thiamine 100 MG tablet Take 1 tablet (100 mg total) by mouth daily.   No facility-administered encounter medications on file as of 05/19/2017.   :  Review of Systems:  Out of a complete 14 point review of systems, all are reviewed and negative with the exception of these symptoms as listed below: Review of Systems  Neurological:       Patient and Vicki Garcia report that she has been doing well.     Objective:  Neurological Exam  Physical Exam Physical Examination:   Vitals:   05/19/17 1127  BP: (!) 162/75  Pulse: 70    General Examination: The patient is a very pleasant 82 y.o. female in no acute distress. She appears thin, she is well groomed. Good spirits.   HEENT: Normocephalic, atraumatic, pupils are equal, round and reactive to light and accommodation. She has corrective eye glasses. Hearing is impaired. Extraocular tracking is fair. Face is symmetric. Speech is clear, no dysarthria, no head, or neck tremor. There is no hypophonia. There is no lip, neck/head, jaw or voice tremor. Neck is supple with full range of motion. Oropharynx exam reveals: moderate mouth dryness, adequate dental hygiene. Tongue protrudes centrally and palate elevates symmetrically.   Chest: Clear to auscultation without wheezing, rhonchi or crackles noted.  Heart: S1+S2+0, regular and normal without murmurs, rubs or gallops noted.   Abdomen: Soft, non-tender and non-distended with normal bowel sounds appreciated on auscultation.  Extremities: There is no pitting edema in the distal lower extremities bilaterally.  Skin: Warm and dry without trophic changes noted.  Musculoskeletal: exam reveals no obvious joint deformities, tenderness or joint swelling or erythema.   Neurologically:  Mental status:  The  patient is awake, alert and pays good attention. Vicki immediate and remote memory, attention, language skills and fund of knowledge are impaired. There is no evidence of aphasia, agnosia, apraxia or anomia. Speech is clear with normal prosody and enunciation. She is cooperative with the exam.  On 02/18/2017: MMSE: 15/30, CDT: 1/4, AFT: 3/min.  Cranial nerves II - XII are as described above under HEENT exam. In addition: shoulder shrug is normal with equal shoulder height noted. Motor exam: Normal bulk, strength and tone is noted. There is no drift, tremor or rebound. Romberg is Not tested due to safety concerns. Fine motor skills and coordination:grossly intact for age. Cerebellar testing: No dysmetria or intention tremor. There is no truncal or gait ataxia.  Sensory exam: intact to light touch in the upper and lower extremities.  Gait, station and balance: She stands without difficulty, posture is age-appropriate. She walks independently, preserved arm swing, balance is preserved.        Assessment and Plan:   In summary, Vicki Garcia is a very pleasant 82 year old female with an underlying medical history of hypertension, hearing impairment with hearing aids, tinnitus, seasonal allergies, hyperlipidemia, history of melanoma, history of esophagitis and reflux disease, history of low back pain with evidence of lumbar spinal stenosis, and history of diverticulosis, who presents for follow-up consultation of Vicki memory loss. Per Garcia's report, she has had memory loss for at least a year, likely longer. She may have tried Namzaric in 2017. She's no longer on it. There is no obvious family history of dementia. She does not have significant vascular risk factors. Vicki brain MRI from February 2019 showed moderate atrophy. She may have late onset Alzheimer's disease. She has had some mood irritability recently, but today she is in a good mood and per Garcia she has been stable mood wise. She had recent  hospital admission for syncope/dehydration. She has neuropsychological evaluation pending for next month and a follow-up in early July after that.  We mutually agreed to wait out those appointments and consider memory loss medication next.I did stress the importance of proper nutrition and hydration. She has lost just a little bit of weight. She is advised to increase Vicki Ensure to 2 bottles per day.  I would like to see Vicki back in about 3 months, sooner if needed. I answered all their questions today and the patient and Vicki Garcia were in agreement. I spent 20 minutes in total face-to-face time with the patient, more than 50% of which was spent in counseling and coordination of care, reviewing test results, reviewing medication and discussing or reviewing the diagnosis of dementia its prognosis and treatment options. Pertinent laboratory and imaging test results that were available during this visit with the patient were reviewed by me and considered in my medical decision making (see chart for details).

## 2017-05-19 NOTE — Patient Instructions (Addendum)
Please add supplements such as Ensure or Boost to your diet, 2 bottles per day and drink plenty of water, 3 of the 16 oz bottles over the course of the day.  We will await your appointments with the neuropsychologist next month.

## 2017-05-20 NOTE — Progress Notes (Signed)
Cardiology Office Note   Date:  05/24/2017   ID:  Vicki Garcia, DOB March 11, 1935, MRN [id number]  PCP:  Janith Lima, MD  Cardiologist:  New  Chief Complaint  Patient presents with  . Follow-up  . Loss of Consciousness     History of Present Illness: Vicki Garcia is a 82 y.o. female who presents for cardiology consultation and recommendations at the request of Dr. Scarlette Calico, after patient was seen in the ER for syncopal episode on 04/28/2017. She was noted to have AKI and hypokalemia n the setting of dehydration. She was given IV fluid replacement and had improved symptoms.   She has had significant memory loss per Dr. Ronnald Ramp' note, reporting that she forgets to eat and drink. She was found to have iron-deficiency anemia per recent labs secondary to poor nutritional intake, and prescribed ferrous sulfate.   She has been seen by neurology, Dr. Rayetta Pigg 05/19/2017.He felt it was possible that she had late onset of Alzheimer's. MRI of brain 02/2017 revealed moderate atrophy. ,   She denies any further episodes of dizziness or near syncope. Her husband who is with her today finishes her sentences for her and answers most of her questions for her. She is alert and oriented but has significant issues with memory.  She has trouble remembering the names of her children and why they live.  She does not remember eating today.  She parrots what is being said by her husband and by nurse and provider today. She has continued to lose weight.  Her weight normally runs around 120 pounds according to her husband, and she is now 96 pounds.    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   They were active at [employer] and a local golfing country club.  This has not been possible over the last couple of years.  Past Medical History:  Diagnosis  Date  . Disorder of left rotator cuff   . Diverticulosis of colon    AVM @ colonoscopy 07/2008  . GERD (gastroesophageal reflux disease)   . History of adenomatous polyp of colon    2010  . History of esophageal dilatation    07/ 2014  . History of esophagitis    07-/ 2014  . History of melanoma excision    1987  right upper arm  . Hyperlipidemia   . Hypertension   . Prolapsed internal hemorrhoids, grade 4   . Seasonal and perennial allergic rhinitis   . Tinnitus    chronic  . Wears glasses   . Wears hearing aid    bilateral    Past Surgical History:  Procedure Laterality Date  . COLONOSCOPY  last one 07-28-2012  . HEMORRHOID SURGERY N/A 04/18/2015   Procedure: HEMORRHOIDECTOMY;  Surgeon: Leighton Ruff, MD;  Location: [facility];  Service: General;  Laterality: N/A;  . LUMBAR FUSION  11-19-2006   L4 - L5  . UPPER GASTROINTESTINAL ENDOSCOPY  07-28-2012     Current Outpatient Medications  Medication Sig Dispense Refill  . citalopram (CELEXA) 10 MG tablet Take 1 tablet (10 mg total) by mouth daily. 90 tablet 1  . dextromethorphan (DELSYM) 30 MG/5ML liquid Take 2.5 mLs (15 mg total) by mouth 2 (two) times daily. 148 mL 5  . feeding supplement, ENSURE ENLIVE, (ENSURE ENLIVE) LIQD Take  237 mLs by mouth 2 (two) times daily between meals. 237 mL 12  . ferrous sulfate 325 (65 FE) MG tablet Take 1 tablet (325 mg total) by mouth 2 (two) times daily with a meal. 180 tablet 1  . guaiFENesin (MUCINEX) 600 MG 12 hr tablet Take 1 tablet (600 mg total) by mouth 2 (two) times daily. 20 tablet 0  . loratadine (CLARITIN) 10 MG tablet Take 10 mg by mouth daily as needed for allergies.    Marland Kitchen losartan-hydrochlorothiazide (HYZAAR) 100-12.5 MG tablet TAKE 1 TABLET ONCE DAILY. 90 tablet 1  . thiamine 100 MG tablet Take 1 tablet (100 mg total) by mouth daily. 30 tablet 0   No current facility-administered medications for this visit.     Allergies:   Codeine; Namzaric [memantine  hcl-donepezil hcl]; Norvasc [amlodipine besylate]; and Vasotec    Social History:  The patient  reports that she quit smoking about 48 years ago. Her smoking use included cigarettes. She quit after 10.00 years of use. She has never used smokeless tobacco. She reports that she drinks about 3.0 oz of alcohol per week. She reports that she does not use drugs.   Family History:  The patient's family history includes Breast cancer in her paternal aunt; Colon cancer in her maternal uncle; Coronary artery disease in her father; Heart attack (age of onset: 9) in her paternal uncle; Hemolytic uremic syndrome in her unknown relative; Hypertension in her brother; Kidney cancer in her brother; Lung cancer in her brother; Lymphoma in her mother; Prostate cancer in her brother; Skin cancer in her brother; Stomach cancer in her brother; Ulcers in her daughter.    ROS: All other systems are reviewed and negative. Unless otherwise mentioned in H&P    PHYSICAL EXAM: VS:  BP 126/60 (BP Location: Left Arm, Patient Position: Sitting, Cuff Size: Normal)   Pulse 81   Ht 5\' 1"  (1.549 m)   Wt 96 lb (43.5 kg)   BMI 18.14 kg/m  , BMI Body mass index is 18.14 kg/m. GEN: Well nourished, well developed, in no acute distress  HEENT: normal  Neck: no JVD, carotid bruits, or masses Cardiac: RRR; no murmurs, rubs, or gallops,no edema  Respiratory:  clear to auscultation bilaterally, normal work of breathing GI: soft, nontender, nondistended, + BS MS: no deformity or atrophy  Skin: warm and dry, no rash Neuro:  Strength and sensation are intact Psych: euthymic mood, full affect, significant memory loss both short-term and long-term.   EKG: NSR rate of 81 bpm. Evidence    Recent Labs: 04/27/2017: ALT 18; Magnesium 1.9; TSH 1.726 04/28/2017: BUN 10; Creatinine, Ser 0.80; Hemoglobin 10.4; Platelets 151; Potassium 3.5; Sodium 136    Lipid Panel    Component Value Date/Time   CHOL 206 (H) 03/17/2017 1122   CHOL 154  06/28/2013 1440   TRIG 76.0 03/17/2017 1122   TRIG 75 06/28/2013 1440   TRIG 54 12/15/2005 0859   HDL 89.10 03/17/2017 1122   HDL 71 06/28/2013 1440   CHOLHDL 2 03/17/2017 1122   VLDL 15.2 03/17/2017 1122   LDLCALC 102 (H) 03/17/2017 1122   LDLCALC 68 06/28/2013 1440      Wt Readings from Last 3 Encounters:  05/24/17 96 lb (43.5 kg)  05/19/17 97 lb (44 kg)  05/05/17 97 lb 12 oz (44.3 kg)      Other studies Reviewed:  1. MRI 02/26/2017  IMPRESSION:  This MRI of the brain without contrast shows the following: 1.    2117  mm extra-axial mass most consistent with a meningioma.The mass is hyperintense to brain on T2-weighted images and diffusion weighted images and may represent a less common angiomatous or secretory meningioma.    There is no adjacent mass effect implying a slow growth or stability. If clinically indicated, consider repeat imaging with contrast. 2.   Moderate generalized cortical atrophy that is most severe in the mesial temporal lobes. 3.   Mild chronic microvascular ischemic change.  Echocardiogram 05/07/2017  Left ventricle: The cavity size was normal. Wall thickness was   normal. Systolic function was normal. The estimated ejection   fraction was in the range of 60% to 65%. Wall motion was normal;   there were no regional wall motion abnormalities. Doppler   parameters are consistent with abnormal left ventricular   relaxation (grade 1 diastolic dysfunction). The E/e&' ratio is   >15, suggesting elevated LV filling pressure. - Mitral valve: Mildly thickened leaflets . There was trivial   regurgitation. - Left atrium: The atrium was normal in size. - Tricuspid valve: There was trivial regurgitation. - Pulmonary arteries: PA peak pressure: 23 mm Hg (S). - Inferior vena cava: The vessel was normal in size. The   respirophasic diameter changes were in the normal range (>= 50%),   consistent with normal central venous pressure.  ASSESSMENT AND PLAN:  1.  Syncopal Episode: On review of records from hospitalization in April 2019, echocardiogram was completed, there were no abnormalities noted that would be etiology of syncopal episode.  She was found to be mildly anemic and hypokalemic.  We will repeat those labs today to include CBC and BMET.  I have advised her on nutritional supplements to include boost or Ensure.  The patient states the only way that she knows that she is hungry as if her stomach rales otherwise she does not remember whether or not she has eaten.  Due to age, and significant memory issues, we will not plan any outpatient cardiac monitoring or ischemic work-up at this time.  2.  History of hypertension: CT scan and MRI of the brain did not reveal infarct.  Echocardiogram revealed normal LV systolic function.  She is currently on losartan 100/12.5 mg daily.  Would be careful to avoid hypotension in this patient who continues to lose weight and not eat properly.  Will consider decreasing dose to 50//12.5 mg or eliminate HCTZ portion altogether to avoid dehydration.  Close follow-up with PCP concerning blood pressures.  3.  Significant memory loss: She is being followed by neurology and also has a an appointment with neuropsychology on July 05, 2017.  Highly recommend she keep that appointment.  There are concerns of being able to be cared for appropriately if her memory status worsens.  Her husband is very attentive but also has some issues with memory loss as well, but not as profound ,which can cause a significant health danger to them both.  Will defer to neuropsych for more recommendations.  Current medicines are reviewed at length with the patient today.  I have discussed this with Dr. Claiborne Billings onsite as he is DOD at the [facility] office today. He agrees with my assessment and plan.   Labs/ tests ordered today include: CBC and BMET   Phill Myron. West Pugh, ANP, AACC   05/24/2017 3:16 PM    [facility] [address], [place], [place] [address] Phone: [phone]; Fax: [phone]

## 2017-05-24 ENCOUNTER — Encounter

## 2017-05-24 ENCOUNTER — Ambulatory Visit (INDEPENDENT_AMBULATORY_CARE_PROVIDER_SITE_OTHER): Payer: Medicare Other | Admitting: Adult Health

## 2017-05-24 ENCOUNTER — Encounter: Payer: Self-pay | Admitting: Adult Health

## 2017-05-24 VITALS — BP 126/60 | HR 81 | Ht 61.0 in | Wt 96.0 lb

## 2017-05-24 DIAGNOSIS — I10 Essential (primary) hypertension: Secondary | ICD-10-CM

## 2017-05-24 DIAGNOSIS — Z79899 Other long term (current) drug therapy: Secondary | ICD-10-CM | POA: Diagnosis not present

## 2017-05-24 DIAGNOSIS — R55 Syncope and collapse: Secondary | ICD-10-CM | POA: Diagnosis not present

## 2017-05-24 NOTE — Patient Instructions (Addendum)
Medication Instructions:  NO CHANGES- Your physician recommends that you continue on your current medications as directed. Please refer to the Current Medication list given to you today.  If you need a refill on your cardiac medications before your next appointment, please call your pharmacy.  Labwork: CBC AND BMET TODAY HERE IN OUR OFFICE AT [facility]  Take the provided lab slips with you to the lab for your blood draw.   Follow-Up: Your physician wants you to follow-up in: [place] ([place]), DNP,AACC IF PRIMARY CARDIOLOGIST IS UNAVAILABLE.    Thank you for choosing [facility] at [facility]!!

## 2017-07-02 ENCOUNTER — Other Ambulatory Visit: Payer: Self-pay | Admitting: Internal Medicine

## 2017-07-02 DIAGNOSIS — F0391 Unspecified dementia with behavioral disturbance: Secondary | ICD-10-CM

## 2017-07-02 DIAGNOSIS — F03918 Unspecified dementia, unspecified severity, with other behavioral disturbance: Secondary | ICD-10-CM

## 2017-07-05 ENCOUNTER — Ambulatory Visit (INDEPENDENT_AMBULATORY_CARE_PROVIDER_SITE_OTHER): Payer: Medicare Other | Admitting: Psychology

## 2017-07-05 ENCOUNTER — Ambulatory Visit: Payer: Medicare Other | Admitting: Psychology

## 2017-07-05 ENCOUNTER — Encounter: Payer: Self-pay | Admitting: Psychology

## 2017-07-05 ENCOUNTER — Telehealth: Payer: Self-pay | Admitting: Psychology

## 2017-07-05 DIAGNOSIS — F0391 Unspecified dementia with behavioral disturbance: Secondary | ICD-10-CM | POA: Diagnosis not present

## 2017-07-05 DIAGNOSIS — R413 Other amnesia: Secondary | ICD-10-CM

## 2017-07-05 NOTE — Progress Notes (Addendum)
   Neuropsychology Note  Vicki Garcia completed 60 minutes of neuropsychological testing with technician, Vicki Garcia, BS, under the supervision of Dr. Macarthur Critchley, Licensed Psychologist. The patient became overtly distressed during the testing session and stated that she wanted to leave and could not complete the battery. She reported she had small children at home that she needed to care for. Despite encouragement and redirection from both psychometrician and psychologist, she did not agree to complete the full battery of testing.   Clinical Decision Making: In considering the patient's current level of functioning, level of presumed impairment, nature of symptoms, emotional and behavioral responses during the interview, level of literacy, and observed level of motivation/effort, a battery of tests was selected and communicated to the psychometrician.  Communication between the psychologist and technician was ongoing throughout the testing session and changes were made as deemed necessary based on patient performance on testing, technician observations and additional pertinent factors such as those listed above.  [name] will return within approximately 2 weeks for an interactive feedback session with Dr. Si Raider at which time her test performances, clinical impressions and treatment recommendations will be reviewed in detail. The patient understands she can contact our office should she require our assistance before this time.  35 minutes spent performing neuropsychological evaluation services/clinical decision making (psychologist). [CPT [address]] 60 minutes spent face-to-face with patient administering standardized tests, 30 minutes spent scoring (technician). [CPT Y8200648, 60454]  Full report to follow.

## 2017-07-05 NOTE — Progress Notes (Signed)
NEUROBEHAVIORAL STATUS EXAM   Name: Vicki Garcia Date of Birth: 19-Oct-1935 Date of Interview: 07/05/2017  Reason for Referral:  Vicki Garcia is a 82 y.o. female who is referred for neuropsychological evaluation by Dr. Star Age of [facility] due to concerns about memory loss. This patient is accompanied in the office by her husband who provides the majority of the history.  History of Presenting Problem:  Per records reviewed, the patient was first seen by Dr. Rexene Alberts in January 2019 and at that time her husband reported a several year history of memory loss. MMSE at that time was 15/30, CDT was 1/4, and AFT was 3/min. Brain MRI completed on 02/26/2017 reportedly revealed the following: 1.    2117 mm extra-axial mass most consistent with a meningioma.The mass is hyperintense to brain on T2-weighted images and diffusion weighted images and may represent a less common angiomatous or secretory meningioma.    There is no adjacent mass effect implying a slow growth or stability. If clinically indicated, consider repeat imaging with contrast. 2.   Moderate generalized cortical atrophy that is most severe in the mesial temporal lobes. 3.   Mild chronic microvascular ischemic change.  In April 2019 the patient was admitted to [facility] for 2 days after having a syncopal event, thought to be secondary to dehydration. She followed up with Dr. Rexene Alberts on 05/19/2017.   The patient denies any change in memory or concern about cognitive decline. She reports that she spends most of her time at home and that she reads a lot. She goes to Bible study with a friend, and she goes to church weekly. She says she is still driving and cooking (this is inaccurate per her husband). She denies any physical complaints. She feels her mood is "average". She denies any depression. She denies any history of suicidal ideation or intention. She denies any current psychosocial stressors. She does not have any sleep  difficulty. She reported she has minimal wine, one glass at most, and not every day. She does not know why she has to go through the current examination.   Her husband was interviewed separately while she completed testing with the technician. Per her husband, there was gradual onset of memory loss at least 3 years ago and it has been progressing over time. He reported that he made her stop driving 2 years ago because she had gotten lost on several occasions already by that time. He also manages her medications now (administers them to her daily) and he has taken over caring for their two dogs. He also does the finances. She is still able to do laundry, some housekeeping and some yardwork. She hasn't planted the flowers in 2 years. Before her husband took over her medications, he found some from 2017 that she never took. She is able to put appointments on the calendar.   Most upsetting to her husband is that in the evenings the patient sometimes does not recognize him. She will ask where her husband is. She gets agitated if he tries to explain that he is her husband. Now he just tries to avoid her in the evenings when she starts asking for her husband. She does not wander or leave the house by herself. It does not appear she is having hallucinations. She is disorganized, misplacing items and putting things in places they do not go. She loses her pocketbook, hearing aids and jewelry, for example. Upon direct questioning, her husband initially denied observing forgetfulness for recent  conversations/events, but later reported she is doing this.   She is not having any trouble with balance. She has not had any falls other than the one in the context of the syncopal episode/dehydration in April. Her appetite is pretty good. She has 2-3 meals a day. She is drinking more water.   Psychiatric history was denied.   There is no known family history of dementia.  One of the patient's daughters has suggested that  the patient go to a day program at [employer].   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 Per records, former smoker, quit 1971.   Medical History: Past Medical History:  Diagnosis Date  . Disorder of left rotator cuff   . Diverticulosis of colon    AVM @ colonoscopy 07/2008  . GERD (gastroesophageal reflux disease)   . History of adenomatous polyp of colon    2010  . History of esophageal dilatation    07/ 2014  . History of esophagitis    07-/ 2014  . History of melanoma excision    1987  right upper arm  . Hyperlipidemia   . Hypertension   . Prolapsed internal hemorrhoids, grade 4   . Seasonal and perennial allergic rhinitis   . Tinnitus    chronic  . Wears glasses   . Wears hearing aid    bilateral     Current Medications:  Outpatient Encounter Medications as of 07/05/2017  Medication Sig  . citalopram (CELEXA) 10 MG tablet TAKE 1 TABLET ONCE DAILY.  Marland Kitchen dextromethorphan (DELSYM) 30 MG/5ML liquid Take 2.5 mLs (15 mg total) by mouth 2 (two) times daily.  . feeding supplement, ENSURE ENLIVE, (ENSURE ENLIVE) LIQD Take 237 mLs by mouth 2 (two) times daily between meals.  . ferrous sulfate 325 (65 FE) MG tablet Take 1 tablet (325 mg total) by mouth 2 (two) times daily with a meal.  . guaiFENesin (MUCINEX) 600 MG 12 hr tablet Take 1 tablet (600 mg total) by mouth 2 (two) times daily.  Marland Kitchen loratadine (CLARITIN) 10 MG tablet Take 10 mg by mouth daily as needed for allergies.  Marland Kitchen losartan-hydrochlorothiazide (HYZAAR) 100-12.5 MG tablet TAKE 1 TABLET ONCE DAILY.  Marland Kitchen thiamine 100 MG tablet Take 1 tablet (100 mg total) by mouth daily.   No facility-administered encounter medications on file as of 07/05/2017.      Behavioral  Observations:   Appearance: Neatly and appropriately dressed and groomed Significant hearing impairment observed. Gait: Ambulated independently, no gross abnormalities observed Speech: Fluent; normal rate, rhythm and volume. Mild word finding difficulty. Thought process: Appears linear Affect: Blunted, irritable with husband and with the testing process. Despite encouragement and motivation from myself and the technician, she refused to complete the full battery of testing. She did complete the DRS-2. She reported she could not stay any longer because she had little babies at home. Interpersonal: Guarded   60 minutes spent face-to-face with patient completing neurobehavioral status exam. 40 minutes spent integrating medical records/clinical data and completing this report. CPT codes [id number] unit; [id number] unit.   TESTING: There is medical necessity to proceed with neuropsychological assessment as the results will be used to aid in differential diagnosis and clinical decision-making and to inform specific treatment recommendations. Per the patient, her husband and medical records reviewed, there has  been a change in cognitive functioning and a reasonable suspicion of dementia.  Clinical Decision Making: In considering the patient's current level of functioning, level of presumed impairment, nature of symptoms, emotional and behavioral responses during the interview, level of literacy, and observed level of motivation, a battery of tests was selected and communicated to the psychometrician.    Following the clinical interview/neurobehavioral status exam, the patient completed neuropsychological testing with my psychometrician under my supervision (see separate note).   PLAN: The patient will return to see me for a follow-up session at which time her test performances and my impressions and treatment recommendations will be reviewed in detail.  Evaluation ongoing; full report to follow.

## 2017-07-16 DIAGNOSIS — H40013 Open angle with borderline findings, low risk, bilateral: Secondary | ICD-10-CM | POA: Diagnosis not present

## 2017-07-18 NOTE — Progress Notes (Signed)
NEUROPSYCHOLOGICAL EVALUATION   Name:    Vicki Garcia  Date of Birth:   [birth year] Date of Interview:  07/05/2017 Date of Testing:  07/05/2017   Date of Feedback:  07/19/2017       Background Information:  Reason for Referral:  Vicki Garcia is a 82 y.o. female referred by Dr. Star Garcia of [facility] to assess her current level of cognitive functioning and assist in differential diagnosis. The current evaluation consisted of a review of available medical records, an interview with the patient and her husband, and the completion of a neuropsychological testing battery. Informed consent was obtained.  History of Presenting Problem:  Per records reviewed, the patient was first seen by Dr. Rexene Garcia in January 2019 and at that time her husband reported a several year history of memory loss. MMSE at that time was 15/30, CDT was 1/4, and AFT was 3/min. Brain MRI completed on 02/26/2017 reportedly revealed the following: 1. 2117 mm extra-axial mass most consistent with a meningioma.The mass is hyperintense to brain on T2-weighted images and diffusion weighted images and may represent a less common angiomatous or secretory meningioma. There is no adjacent mass effect implying a slow growth or stability. If clinically indicated, consider repeat imaging with contrast. 2. Moderate generalized cortical atrophy that is most severe in the mesial temporal lobes. 3. Mild chronic microvascular ischemic change.  In April 2019 the patient was admitted to [facility] for 2 days after having a syncopal event, thought to be secondary to dehydration. She followed up with Dr. Rexene Garcia on 05/19/2017.   The patient denies any change in memory or concern about cognitive decline. She reports that she spends most of her time at home and that she reads a lot. She goes to Bible study with a friend, and she goes to church weekly. She says she is still driving and cooking (this is inaccurate per her husband).  She denies any physical complaints. She feels her mood is "average". She denies any depression. She denies any history of suicidal ideation or intention. She denies any current psychosocial stressors. She does not have any sleep difficulty. She reported she has minimal wine, one glass at most, and not every day. She does not know why she has to go through the current examination.   Her husband was interviewed separately while she completed testing with the technician. Per her husband, there was gradual onset of memory loss at least 3 years ago and it has been progressing over time. He reported that he made her stop driving 2 years ago because she had gotten lost on several occasions already by that time. He also manages her medications now (administers them to her daily) and he has taken over caring for their two dogs. He also does the finances. She is still able to do laundry, some housekeeping and some yardwork. She hasn't planted the flowers in 2 years. Before her husband took over her medications, he found some from 2017 that she never took. She is able to put appointments on the calendar.   Most upsetting to her husband is that in the evenings the patient sometimes does not recognize him. She will ask where her husband is. She gets agitated if he tries to explain that he is her husband. Now he just tries to avoid her in the evenings when she starts asking for her husband. She does not wander or leave the house by herself. It does not appear she is having hallucinations. She is disorganized,  misplacing items and putting things in places they do not go. She loses her pocketbook, hearing aids and jewelry, for example. Upon direct questioning, her husband initially denied observing forgetfulness for recent conversations/events, but later reported she is doing this.   She is not having any trouble with balance. She has not had any falls other than the one in the context of the syncopal episode/dehydration  in April. Her appetite is pretty good. She has 2-3 meals a day. She is drinking more water.   Psychiatric history was denied.   There is no known family history of dementia.  One of the patient's daughters has suggested that the patient go to a day program at [facility].   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 Per records, former smoker, quit 1971.   Medical History:  Past Medical History:  Diagnosis Date  . Disorder of left rotator cuff   . Diverticulosis of colon    AVM @ colonoscopy 07/2008  . GERD (gastroesophageal reflux disease)   . History of adenomatous polyp of colon    2010  . History of esophageal dilatation    07/ 2014  . History of esophagitis    07-/ 2014  . History of melanoma excision    1987  right upper arm  . Hyperlipidemia   . Hypertension   . Prolapsed internal hemorrhoids, grade 4   . Seasonal and perennial allergic rhinitis   . Tinnitus    chronic  . Wears glasses   . Wears hearing aid    bilateral    Current medications:  Outpatient Encounter Medications as of 07/19/2017  Medication Sig  . citalopram (CELEXA) 10 MG tablet TAKE 1 TABLET ONCE DAILY.  . dextromethorphan (DELSYM) 30 MG/5ML liquid Take 2.5 mLs (15 mg total) by mouth 2 (two) times daily.  . feeding supplement, ENSURE ENLIVE, (ENSURE ENLIVE) LIQD Take 237 mLs by mouth 2 (two) times daily between meals.  . ferrous sulfate 325 (65 FE) MG tablet Take 1 tablet (325 mg total) by mouth 2 (two) times daily with a meal.  . guaiFENesin (MUCINEX) 600 MG 12 hr tablet Take 1 tablet (600 mg total) by mouth 2 (two) times daily.  . loratadine (CLARITIN) 10 MG tablet Take 10 mg by mouth daily as needed for allergies.  . losartan-hydrochlorothiazide  (HYZAAR) 100-12.5 MG tablet TAKE 1 TABLET ONCE DAILY.  . thiamine 100 MG tablet Take 1 tablet (100 mg total) by mouth daily.   No facility-administered encounter medications on file as of 07/19/2017.      Current Examination:  Behavioral Observations:  Appearance: Neatly and appropriately dressed and groomed Significant hearing impairment observed. Gait: Ambulated independently, no gross abnormalities observed Speech: Fluent; normal rate, rhythm and volume. Mild word finding difficulty. Thought process: Appears linear Affect: Blunted, irritable with husband and with the testing process. Despite encouragement and motivation from myself and the technician, she refused to complete the full battery of testing. She did complete the DRS-2. She reported she could not stay any longer because she had little babies at home. Interpersonal: Guarded Orientation: Oriented to person and place only. She reported her current Garcia as "60". She did not know the month, date, year or day of the week. She could not name the current President and his predecessor.     Tests Administered: . Test of Premorbid Functioning (TOPF) . Dementia Rating Scale-Second Edition (DRS-2) . Geriatric Depression Scale (GDS) 15 Item . Generalized Anxiety Disorder - 7 item screener (GAD-7)  Test Results: Note: Standardized scores are presented only for use by appropriately trained professionals and to allow for any future test-retest comparison. These scores should not be interpreted without consideration of all the information that is contained in the rest of the report. The most recent standardization samples from the test publisher or other sources were used whenever possible to derive standard scores; scores were corrected for Garcia, gender, ethnicity and education when available.   Test Scores:  Test Name Raw Score Standardized Score Descriptor  TOPF 26/70 SS= 90 Average  DRS-2     Attention 35 ss= 10 Average    Initiation/Perseveration 17 ss= 2 Impaired  Construction 4 ss= 5 Borderline  Conceptualization 23 ss= 3 Impaired  Memory 5 ss= 2 Impaired  Total score 84 ss= 2 Impaired  Total score - Education Adjusted (AEMSS)  ss= 0 Severely impaired  GDS-15 2/15  WNL   GAD-7 2/21  WNL       Description of Test Results:  Premorbid verbal intellectual abilities were estimated to have been within the average range based on a test of word reading. On the DRS-2 (an omnibus measure of cognitive function and screening tool for dementia), her overall score fell within the severely impaired range and was indicative of moderate stage dementia. On this test, her basic attention score was average. Visual-spatial construction abilities on simple tasks were borderline impaired. Memory abilities were severely impaired. On the memory scale of the DRS-2, orientation was severely impaired as was immediate recall of both auditory and visual stimuli and delayed recall of both auditory and visual stimuli. Executive functioning, as measure by the conceptualization and initiation/perseveration scales, was also severely impaired. On self-report measures of mood, the patient's responses were not indicative of clinically significant depression or anxiety at the present time.    Clinical Impressions: Moderate dementia, with behavioral disturbance, most likely secondary to Alzheimer's disease. Results of cognitive testing were abnormal with global cognitive dysfunction, and there is evidence that her cognitive deficits are interfering with her ability to manage complex tasks. As such, diagnostic criteria for a dementia syndrome are met. The level of dementia is moderate stage, making it more difficult to clearly differentiate etiology (as cognitive profile is less telling at this point); however, given course of symptoms (gradual onset and decline) and neuroimaging findings (preferential medial temporal lobe atrophy), Alzheimer's disease  is suspected. There is behavioral disturbance with sundowning.     Recommendations/Plan: Based on the findings of the present evaluation, the following recommendations are offered:  1. The patient would likely benefit from having a companion in the home each day and/or attending an adult day program. These would provide supervision as well as stimulation, and caregiver respite for her husband. I provided information on local resources. She is also likely a candidate for memory care placement; her husband is going to try a day program first to see if he can continue to manage her care otherwise at home. 2. From a neuropsychological perspective, the patient appears to be an appropriate candidate for cholinesterase inhibitor therapy. She can follow up with Dr. Athar about this. If the patient does not respond to nonpharmacological approaches to agitation at nighttime, pharmacological intervention could be discussed with Dr. Athar as well. 3. The patient's husband/family will benefit from ongoing education and support, including how to   respond to her behavioral symptoms. I provided some written information on this topic as well as local caregiver support resources.   Feedback to Patient: Nilza Mcgurk and her husband returned for a feedback appointment on 07/19/2017 to review the results of her neuropsychological evaluation with this provider. 20 minutes face-to-face time was spent reviewing her test results, my impressions and my recommendations as detailed above.    Total time spent on this patient's case: 100 minutes for neurobehavioral status exam with psychologist (CPT code 96116, 96121x1 unit); 90 minutes of testing/scoring by psychometrician under psychologist's supervision (CPT codes 96138, 96139x2 units); 180 minutes for integration of patient data, interpretation of standardized test results and clinical data, clinical decision making, treatment planning and preparation of this report, and  interactive feedback with review of results to the patient/family by psychologist (CPT codes 96132, 96133x2 units).      Thank you for your referral of Haley Senn. Please feel free to contact me if you have any questions or concerns regarding this report.     

## 2017-07-19 ENCOUNTER — Encounter: Payer: Self-pay | Admitting: Psychology

## 2017-07-19 ENCOUNTER — Ambulatory Visit (INDEPENDENT_AMBULATORY_CARE_PROVIDER_SITE_OTHER): Payer: Medicare Other | Admitting: Psychology

## 2017-07-19 DIAGNOSIS — F0281 Dementia in other diseases classified elsewhere with behavioral disturbance: Secondary | ICD-10-CM | POA: Diagnosis not present

## 2017-07-19 DIAGNOSIS — F02818 Dementia in other diseases classified elsewhere, unspecified severity, with other behavioral disturbance: Secondary | ICD-10-CM

## 2017-07-19 DIAGNOSIS — G301 Alzheimer's disease with late onset: Secondary | ICD-10-CM | POA: Diagnosis not present

## 2017-07-21 ENCOUNTER — Encounter: Payer: Self-pay | Admitting: Family

## 2017-07-21 ENCOUNTER — Ambulatory Visit: Payer: Medicare Other | Admitting: Family

## 2017-07-21 VITALS — BP 108/60 | HR 71 | Temp 98.6°F | Ht 61.0 in | Wt 95.1 lb

## 2017-07-21 DIAGNOSIS — J019 Acute sinusitis, unspecified: Secondary | ICD-10-CM

## 2017-07-21 MED ORDER — AZITHROMYCIN 250 MG PO TABS: ORAL_TABLET | ORAL | 0 refills | Status: DC

## 2017-07-21 MED ORDER — FLUTICASONE PROPIONATE 50 MCG/ACT NA SUSP: 2.0000 | Freq: Every day | NASAL | 6 refills | Status: DC

## 2017-07-21 NOTE — Progress Notes (Signed)
Vicki Garcia is a 82 y.o. female with the following history as recorded in [employer]:  Patient Active Problem List   Diagnosis Date Noted  . Iron deficiency anemia due to dietary causes 05/05/2017  . Chronic cough 03/06/2016  . Senile dementia with behavioral disturbance 10/24/2014  . Benign esophageal stricture 07/30/2014  . Other abnormal glucose 07/02/2013  . Seasonal and perennial allergic rhinitis 10/29/2008  . MELANOMA, UPPER ARM 04/04/2008  . Essential hypertension 02/22/2007  . Hyperlipidemia LDL goal <160 02/18/2007    Current Outpatient Medications  Medication Sig Dispense Refill  . citalopram (CELEXA) 10 MG tablet TAKE 1 TABLET ONCE DAILY. 90 tablet 0  . dextromethorphan (DELSYM) 30 MG/5ML liquid Take 2.5 mLs (15 mg total) by mouth 2 (two) times daily. 148 mL 5  . feeding supplement, ENSURE ENLIVE, (ENSURE ENLIVE) LIQD Take 237 mLs by mouth 2 (two) times daily between meals. 237 mL 12  . ferrous sulfate 325 (65 FE) MG tablet Take 1 tablet (325 mg total) by mouth 2 (two) times daily with a meal. 180 tablet 1  . guaiFENesin (MUCINEX) 600 MG 12 hr tablet Take 1 tablet (600 mg total) by mouth 2 (two) times daily. 20 tablet 0  . latanoprost (XALATAN) 0.005 % ophthalmic solution   98  . loratadine (CLARITIN) 10 MG tablet Take 10 mg by mouth daily as needed for allergies.    Marland Kitchen losartan-hydrochlorothiazide (HYZAAR) 100-12.5 MG tablet TAKE 1 TABLET ONCE DAILY. 90 tablet 0  . thiamine 100 MG tablet Take 1 tablet (100 mg total) by mouth daily. 30 tablet 0  . azithromycin (ZITHROMAX) 250 MG tablet 2 tabs po qd x 1 day; 1 tablet per day x 4 days; 6 tablet 0  . fluticasone (FLONASE) 50 MCG/ACT nasal spray Place 2 sprays into both nostrils daily. 16 g 6   No current facility-administered medications for this visit.     Allergies: Codeine; Namzaric [memantine hcl-donepezil hcl]; Norvasc [amlodipine besylate]; and Vasotec  Past Medical History:  Diagnosis Date  . Disorder of left rotator  cuff   . Diverticulosis of colon    AVM @ colonoscopy 07/2008  . GERD (gastroesophageal reflux disease)   . History of adenomatous polyp of colon    2010  . History of esophageal dilatation    07/ 2014  . History of esophagitis    07-/ 2014  . History of melanoma excision    1987  right upper arm  . Hyperlipidemia   . Hypertension   . Prolapsed internal hemorrhoids, grade 4   . Seasonal and perennial allergic rhinitis   . Tinnitus    chronic  . Wears glasses   . Wears hearing aid    bilateral    Past Surgical History:  Procedure Laterality Date  . COLONOSCOPY  last one 07-28-2012  . HEMORRHOID SURGERY N/A 04/18/2015   Procedure: HEMORRHOIDECTOMY;  Surgeon: Leighton Ruff, MD;  [facility];  Service: General;  Laterality: N/A;  . LUMBAR FUSION  11-19-2006   L4 - L5  . UPPER GASTROINTESTINAL ENDOSCOPY  07-28-2012    Family History  Problem Relation Age of Onset  . Skin cancer Brother        also spinal stenosis  . Kidney cancer Brother   . Stomach cancer Brother   . Lung cancer Brother        liver mets  . Coronary artery disease Father   . Hypertension Brother   . Prostate cancer Brother   . Breast cancer  Paternal Aunt   . Heart attack Paternal Uncle 65  . Lymphoma Mother        NHL  . Hemolytic uremic syndrome Unknown        granddaughter  . Colon cancer Maternal Uncle   . Ulcers Daughter   . Stroke Neg Hx     Social History   Tobacco Use  . Smoking status: Former Smoker    Years: 10.00    Types: Cigarettes    Last attempt to quit: 01/19/1969    Years since quitting: 48.5  . Smokeless tobacco: Never Used  Substance Use Topics  . Alcohol use: Yes    Alcohol/week: 3.0 oz    Types: 5 Glasses of wine per week    Comment: occasional    Subjective:  Patient presents with concerns for 1 week history of cough/congestion; +sore throat." + wheezy cough; not prone to bronchitis or pneumonia; using OTC cough drops; husband accompanies  today;  Objective:  Vitals:   07/21/17 0935  BP: 108/60  Pulse: 71  Temp: 98.6 F (37 C)  TempSrc: Oral  SpO2: 95%  Weight: 95 lb 1.3 oz (43.1 kg)  Height: 5\' 1"  (1.549 m)    General: Well developed, well nourished, in no acute distress  Skin : Warm and dry.  Head: Normocephalic and atraumatic  Eyes: Sclera and conjunctiva clear; pupils round and reactive to light; extraocular movements intact  Ears: External normal; canals clear; tympanic membranes normal  Oropharynx: Pink, supple. No suspicious lesions; thick post-nasal drainage noted Neck: Supple without thyromegaly, adenopathy  Lungs: Respirations unlabored; clear to auscultation bilaterally without wheeze, rales, rhonchi  CVS exam: normal rate and regular rhythm.  Neurologic: Alert and oriented; speech intact; face symmetrical; moves all extremities well; CNII-XII intact without focal deficit   Assessment:  1. Acute sinusitis, recurrence not specified, unspecified location     Plan:  Suspect allergy component; continue Claritin 10 mg daily; add Flonase NS; Rx for Z-pak #1 take as directed; increase fluids, rest and follow-up worse, no better.    No follow-ups on file.  No orders of the defined types were placed in this encounter.   Requested Prescriptions   Signed Prescriptions Disp Refills  . fluticasone (FLONASE) 50 MCG/ACT nasal spray 16 g 6    Sig: Place 2 sprays into both nostrils daily.  Marland Kitchen azithromycin (ZITHROMAX) 250 MG tablet 6 tablet 0    Sig: 2 tabs po qd x 1 day; 1 tablet per day x 4 days;

## 2017-07-28 DIAGNOSIS — R413 Other amnesia: Secondary | ICD-10-CM | POA: Diagnosis not present

## 2017-08-04 DIAGNOSIS — H2513 Age-related nuclear cataract, bilateral: Secondary | ICD-10-CM | POA: Diagnosis not present

## 2017-08-04 DIAGNOSIS — H40053 Ocular hypertension, bilateral: Secondary | ICD-10-CM | POA: Diagnosis not present

## 2017-08-05 ENCOUNTER — Encounter: Payer: Medicare Other | Admitting: Psychology

## 2017-08-19 ENCOUNTER — Ambulatory Visit: Payer: Medicare Other | Admitting: Neurology

## 2017-08-19 ENCOUNTER — Telehealth: Payer: Self-pay | Admitting: Neurology

## 2017-08-19 ENCOUNTER — Encounter: Payer: Self-pay | Admitting: Neurology

## 2017-08-19 VITALS — BP 125/72 | HR 73 | Ht 61.0 in | Wt 98.0 lb

## 2017-08-19 DIAGNOSIS — F03B18 Unspecified dementia, moderate, with other behavioral disturbance: Secondary | ICD-10-CM

## 2017-08-19 DIAGNOSIS — F0391 Unspecified dementia with behavioral disturbance: Secondary | ICD-10-CM

## 2017-08-19 MED ORDER — DONEPEZIL HCL 5 MG PO TABS
5.0000 mg | ORAL_TABLET | Freq: Every day | ORAL | 5 refills | Status: DC
Start: 2017-08-19 — End: 2018-02-28

## 2017-08-19 NOTE — Patient Instructions (Signed)
As discussed, we will try you on low dose Aricept (generic name: donepezil) 5 mg: take one pill each evening. Common side effects may include dry eyes, dry mouth, confusion, low pulse, low blood pressure, also GI related side effects (nausea, vomiting, diarrhea, constipation), headaches; rare side effects may include hallucinations and seizures.   You may have had a sample for Namzaric in the past, but did not take it from what I understand. If you find the sample pack at home, please call us with the name of the medication, so we can update our records.   You and your husband don't believe that you ever had a reaction to a memory medication.   We will do a three-month follow-up with one of our nurse practitioners, at which time we will consider increasing your memory medication.

## 2017-08-19 NOTE — Progress Notes (Signed)
Subjective:    Patient ID: Vicki Garcia is a 82 y.o. female.  HPI     Interim history:   Ms. Vicki Garcia is a 82 year old right-handed woman with an underlying medical history of hypertension, hearing impairment with hearing aids, tinnitus, seasonal allergies, hyperlipidemia, history of melanoma, history of esophagitis and reflux disease, history of low back pain with evidence of lumbar spinal stenosis, and history of diverticulosis, who presents for follow-up consultation of her memory loss, after a recent neuropsychological evaluation. The patient is accompanied by her husband again today. I last saw her on 05/19/2017, at which time we talked about the importance of nutrition. She had lost weight and had loss of appetite. She had recently been hospitalized for syncopal events. She was found to be dehydrated and improved after IV hydration. She also had acute kidney injury at the time.   Today, 08/19/2017: She reports doing fine. Her husband feels she is stable, she tries to drink ensure once or twice a day. Her weight has stabilized.  She had interim neuropsychological evaluation and testing with Dr. Bonita Quin in June 2019 with a feedback appointment on 07/19/2017 and I reviewed the results:   <<  Clinical Impressions: Moderate dementia, with behavioral disturbance, most likely secondary to Alzheimer's disease. Results of cognitive testing were abnormal with global cognitive dysfunction, and there is evidence that her cognitive deficits are interfering with her ability to manage complex tasks. As such, diagnostic criteria for a dementia syndrome are met. The level of dementia is moderate stage, making it more difficult to clearly differentiate etiology (as cognitive profile is less telling at this point); however, given course of symptoms (gradual onset and decline) and neuroimaging findings (preferential medial temporal lobe atrophy), Alzheimer's disease is suspected. There is behavioral disturbance  with sundowning.    Recommendations/Plan: Based on the findings of the present evaluation, the following recommendations are offered:   1. The patient would likely benefit from having a companion in the home each day and/or attending an adult day program. These would provide supervision as well as stimulation, and caregiver respite for her husband. I provided information on local resources. She is also likely a candidate for memory care placement; her husband is going to try a day program first to see if he can continue to manage her care otherwise at home. 2. From a neuropsychological perspective, the patient appears to be an appropriate candidate for cholinesterase inhibitor therapy. She can follow up with Dr. Rexene Alberts about this. If the patient does not respond to nonpharmacological approaches to agitation at nighttime, pharmacological intervention could be discussed with Dr. Rexene Alberts as well. 3. The patient's husband/family will benefit from ongoing education and support, including how to respond to her behavioral symptoms. I provided some written information on this topic as well as local caregiver support resources.   >>   The patient's allergies, current medications, family history, past medical history, past social history, past surgical history and problem list were reviewed and updated as appropriate.    Previously (copied from previous notes for reference):    I first met her on 02/18/2017 at the request of her primary care physician, at which time her husband reported a several year history of memory loss with progression. Her MMSE was 15 out of 30 at the time. I suggested further workup in the form of brain MRI and neuropsychological evaluation. She had a brain MRI without contrast on 02/26/2017 and I reviewed the results: IMPRESSION:  This MRI of the brain without contrast shows  the following: 1.    2117 mm extra-axial mass most consistent with a meningioma.The mass is hyperintense to brain  on T2-weighted images and diffusion weighted images and may represent a less common angiomatous or secretory meningioma.    There is no adjacent mass effect implying a slow growth or stability. If clinically indicated, consider repeat imaging with contrast. 2.   Moderate generalized cortical atrophy that is most severe in the mesial temporal lobes. 3.   Mild chronic microvascular ischemic change.   We called her husband with the test results. I did suggest we could proceed with an MRI with contrast for better clarification and also that we would probably want to repeat her brain MRI down the Road to monitor for stability of her meningioma/lesion.   She is scheduled for neuropsychological testing in June and a follow-up in early July.    02/18/2017: (She) reports very little, states, that she was brought here under false pretenses and would not answer any questions. She does admit, that her memory has been slipping. Her husband reports that she got lost driving about a year ago. I reviewed your office note from 12/23/2016. She has previously declined neurology appointments. She has been referred to hospice/palliative care, they may have had a couple of visit, maybe a SW. They were supposed to have a Nurse visit.  She is currently not on any memory medication. She apparently has an allergy to Namzaric, but her husband and patient are not aware of her trying it of if she did, what reaction she had. She may have tried it in 2017. She has not had any brain MRI or head CT. She does not have a family history of dementia. She had 2 brothers, only one is alive. She lives with her husband. 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 She went to [employer] college and had a business major in the 43s. They have 2 grown daughters, one lives in [place], the other in [place]. She is a nonsmoker, drinks alcohol about 3-4 times a week, a small glass of wine typically, caffeine in the form of  coffee, usually one cup per day on average.  Her Past Medical History Is Significant For: Past Medical History:  Diagnosis Date  . Disorder of left rotator cuff   . Diverticulosis of colon    AVM @ colonoscopy 07/2008  . GERD (gastroesophageal reflux disease)   . History of adenomatous polyp of colon    2010  . History of esophageal dilatation    07/ 2014  . History of esophagitis    07-/ 2014  . History of melanoma excision    1987  right upper arm  . Hyperlipidemia   . Hypertension   . Prolapsed internal hemorrhoids, grade 4   . Seasonal and perennial allergic rhinitis   . Tinnitus    chronic  . Wears glasses   . Wears hearing aid    bilateral    Her Past Surgical History Is Significant For: Past Surgical History:  Procedure Laterality Date  . COLONOSCOPY  last one 07-28-2012  . HEMORRHOID SURGERY N/A 04/18/2015   Procedure: HEMORRHOIDECTOMY;  Surgeon: Leighton Ruff, MD;  Location: [facility];  Service: General;  Laterality: N/A;  . LUMBAR FUSION  11-19-2006   L4 - L5  . UPPER GASTROINTESTINAL ENDOSCOPY  07-28-2012    Her Family History Is Significant For: Family History  Problem Relation Age of Onset  . Skin cancer Brother  also spinal stenosis  . Kidney cancer Brother   . Stomach cancer Brother   . Lung cancer Brother        liver mets  . Coronary artery disease Father   . Hypertension Brother   . Prostate cancer Brother   . Breast cancer Paternal Aunt   . Heart attack Paternal Uncle 12  . Lymphoma Mother        NHL  . Hemolytic uremic syndrome Unknown        granddaughter  . Colon cancer Maternal Uncle   . Ulcers Daughter   . Stroke Neg Hx     Her Social History Is Significant For: Social History   Socioeconomic History  . Marital status: Married    Spouse name: Not on file  . Number of children: Not on file  . Years of education: Not on file  . Highest education level: Not on file  Occupational History  . Not on file   Social Needs  . Financial resource strain: Not on file  . Food insecurity:    Worry: Not on file    Inability: Not on file  . Transportation needs:    Medical: Not on file    Non-medical: Not on file  Tobacco Use  . Smoking status: Former Smoker    Years: 10.00    Types: Cigarettes    Last attempt to quit: 01/19/1969    Years since quitting: 48.6  . Smokeless tobacco: Never Used  Substance and Sexual Activity  . Alcohol use: Yes    Alcohol/week: 3.0 oz    Types: 5 Glasses of wine per week    Comment: occasional  . Drug use: No  . Sexual activity: Not on file    Comment: G1  P1  M1  Lifestyle  . Physical activity:    Days per week: Not on file    Minutes per session: Not on file  . Stress: Not on file  Relationships  . Social connections:    Talks on phone: Not on file    Gets together: Not on file    Attends religious service: Not on file    Active member of club or organization: Not on file    Attends meetings of clubs or organizations: Not on file    Relationship status: Not on file  Other Topics Concern  . Not on file  Social History Narrative   Regular exercise: Strength Training & walking w/o symptoms          Her Allergies Are:  Allergies  Allergen Reactions  . Codeine Other (See Comments)    REACTION: violently ill with N&V  . Namzaric [Memantine Hcl-Donepezil Hcl] Nausea And Vomiting    Pt reports illness. .  Josem Kaufmann [Amlodipine Besylate] Nausea And Vomiting  . Vasotec Other (See Comments)    cough  :   Her Current Medications Are:  Outpatient Encounter Medications as of 08/19/2017  Medication Sig  . citalopram (CELEXA) 10 MG tablet TAKE 1 TABLET ONCE DAILY.  . feeding supplement, ENSURE ENLIVE, (ENSURE ENLIVE) LIQD Take 237 mLs by mouth 2 (two) times daily between meals.  . ferrous sulfate 325 (65 FE) MG tablet Take 1 tablet (325 mg total) by mouth 2 (two) times daily with a meal.  . latanoprost (XALATAN) 0.005 % ophthalmic solution   .  loratadine (CLARITIN) 10 MG tablet Take 10 mg by mouth daily as needed for allergies.  Marland Kitchen losartan-hydrochlorothiazide (HYZAAR) 100-12.5 MG tablet TAKE 1 TABLET ONCE DAILY.  Marland Kitchen  thiamine 100 MG tablet Take 1 tablet (100 mg total) by mouth daily.  . [DISCONTINUED] azithromycin (ZITHROMAX) 250 MG tablet 2 tabs po qd x 1 day; 1 tablet per day x 4 days;  . [DISCONTINUED] dextromethorphan (DELSYM) 30 MG/5ML liquid Take 2.5 mLs (15 mg total) by mouth 2 (two) times daily.  . [DISCONTINUED] fluticasone (FLONASE) 50 MCG/ACT nasal spray Place 2 sprays into both nostrils daily.  . [DISCONTINUED] guaiFENesin (MUCINEX) 600 MG 12 hr tablet Take 1 tablet (600 mg total) by mouth 2 (two) times daily.   No facility-administered encounter medications on file as of 08/19/2017.   :  Review of Systems:  Out of a complete 14 point review of systems, all are reviewed and negative with the exception of these symptoms as listed below:  Review of Systems  Neurological:       Pt presents today to discuss her memory.    Objective:  Neurological Exam  Physical Exam Physical Examination:   Vitals:   08/19/17 1125  BP: 125/72  Pulse: 73    General Examination: The patient is a very pleasant 82 y.o. female in no acute distress. She appears well-developed and well-nourished and well groomed. Minimally verbal.  HEENT:Normocephalic, atraumatic, pupils are equal, round and reactive to light and accommodation. She has corrective eye glasses. Hearing is impaired, no hearing aids. Extraocular tracking is fair. Face is symmetric. Speech is clear, no dysarthria, no head, or neck tremor. There is no hypophonia. There is no lip, neck/head, jaw or voice tremor. Neck is supple with full range of motion. Oropharynx exam reveals:mild mouth dryness, adequatedental hygiene.Tongue protrudes centrally and palate elevates symmetrically.   Chest:Clear to auscultation without wheezing, rhonchi or crackles noted.  Heart:S1+S2+0,  regular and normal without murmurs, rubs or gallops noted.   Abdomen:Soft, non-tender and non-distended with normal bowel sounds appreciated on auscultation.  Extremities:There isnopitting edema in the distal lower extremities bilaterally.  Skin: Warm and dry without trophic changes noted.  Musculoskeletal: exam reveals no obvious joint deformities, tenderness or joint swelling or erythema.   Neurologically:  Mental status: The patient is awake, alert andpays good attention. Herimmediate and remote memory, attention, language skills and fund of knowledge are impaired. Speech is clear with normal prosody and enunciation.She is cooperative with the exam, a little frustrated with her husband at times.   On1/31/2019: MMSE: 15/30, CDT: 1/4, AFT: 3/min.  On 08/19/2017: MMSE: 13/30, CDT: 1/4, AFT: 1/min.  Cranial nerves II - XII are as described above under HEENT exam. In addition: shoulder shrug is normal with equal shoulder height noted. Motor exam: Normal bulk, strength and tone is noted. There is no drift, tremor or rebound. Romberg is not tested due to safety concerns. Fine motor skills and coordination:grossly intact for age. Cerebellar testing: No dysmetria or intention tremor. There is no truncal or gait ataxia.  Sensory exam: intact to light touch in the upper and lower extremities.  Gait, station and balance:Shestands without difficulty, posture is age-appropriate. She walks independently, preserved arm swing, balance is preserved.  Assessmentand Plan:   In summary,Dwanna Clarkis a very pleasant 89 year oldfemalewith an underlying medical history of hypertension, hearing impairment with no hearing aids (lost them), tinnitus, seasonal allergies, hyperlipidemia, history of melanoma, history of esophagitis and reflux disease, history of low back pain with evidence of lumbar spinal stenosis, and history of diverticulosis, whopresents for follow-up consultation of  her dementia with evidence of mood related and behavioral changes. She has a history of memory loss for at least  2 years. Her husband reports that they have a sample pack of a memory medication at home which she never actually used. He is advised to check on the name of her memory medication, her husband does not recall that she actually had a reaction to medication in the past. Recent neuropsychological evaluation confirms findings in keeping with moderate dementia in the realm of Alzheimer's disease with behavioral changes. Weight has stabilized. She does supplement with Ensure or Boost, 1-2 per day on average. I suggested a trial of low-dose Aricept 5 mg strength once daily. I advised him regarding side effects and cautioned regarding any type of sudden reaction including rash or itching. We talked about the importance of good hydration and nutrition. I suggested a 3 month follow-up with one of our nurse practitioners at which time we can hopefully increase the Aricept generic to 10 mg daily if possible. I answered all their questions today and the patient and her husband were in agreement. Of note, her brain MRI from February 2019 showed moderate atrophy.  I spent 20 minutes in total face-to-face time with the patient, more than 50% of which was spent in counseling and coordination of care, reviewing test results, reviewing medication and discussing or reviewing the diagnosis of dementia, its prognosis and treatment options. Pertinent laboratory and imaging test results that were available during this visit with the patient were reviewed by me and considered in my medical decision making (see chart for details).

## 2017-08-19 NOTE — Telephone Encounter (Signed)
Noted, thanks!

## 2017-08-19 NOTE — Telephone Encounter (Signed)
Pt's husband called he found the samples of namzaric that was discussed at the office visit today. He said she never took and it expired in 2017. FYI

## 2017-08-26 DIAGNOSIS — H2511 Age-related nuclear cataract, right eye: Secondary | ICD-10-CM | POA: Diagnosis not present

## 2017-08-26 DIAGNOSIS — H25811 Combined forms of age-related cataract, right eye: Secondary | ICD-10-CM | POA: Diagnosis not present

## 2017-09-08 ENCOUNTER — Encounter: Payer: Self-pay | Admitting: Internal Medicine

## 2017-09-08 ENCOUNTER — Ambulatory Visit: Payer: Medicare Other | Admitting: Internal Medicine

## 2017-09-08 VITALS — BP 130/60 | HR 64 | Temp 98.7°F | Resp 16 | Ht 61.0 in | Wt 95.8 lb

## 2017-09-08 DIAGNOSIS — I10 Essential (primary) hypertension: Secondary | ICD-10-CM | POA: Diagnosis not present

## 2017-09-08 DIAGNOSIS — F0391 Unspecified dementia with behavioral disturbance: Secondary | ICD-10-CM | POA: Diagnosis not present

## 2017-09-08 DIAGNOSIS — F03918 Unspecified dementia, unspecified severity, with other behavioral disturbance: Secondary | ICD-10-CM

## 2017-09-08 DIAGNOSIS — D508 Other iron deficiency anemias: Secondary | ICD-10-CM | POA: Diagnosis not present

## 2017-09-08 NOTE — Progress Notes (Signed)
Subjective:  Patient ID: Vicki Garcia, female    DOB: [birth year]  Age: 82 y.o. MRN: [id number]  CC: Hypertension and Anemia   HPI [name] presents for f/up - She is with her husband today.  He continues to express frustration with her condition.  Her dementia is not improving.  He is afraid that she is going to start wandering.  She wakes up frequently during the night and has sundowning in the evening.  He has not hired any help within the home and he has not made a decision to place her in a memory care unit.  When she is approached with these issues she becomes defensive and confrontational.  Outpatient Medications Prior to Visit  Medication Sig Dispense Refill  . citalopram (CELEXA) 10 MG tablet TAKE 1 TABLET ONCE DAILY. 90 tablet 0  . donepezil (ARICEPT) 5 MG tablet Take 1 tablet (5 mg total) by mouth at bedtime. 30 tablet 5  . feeding supplement, ENSURE ENLIVE, (ENSURE ENLIVE) LIQD Take 237 mLs by mouth 2 (two) times daily between meals. 237 mL 12  . ferrous sulfate 325 (65 FE) MG tablet Take 1 tablet (325 mg total) by mouth 2 (two) times daily with a meal. 180 tablet 1  . latanoprost (XALATAN) 0.005 % ophthalmic solution   98  . loratadine (CLARITIN) 10 MG tablet Take 10 mg by mouth daily as needed for allergies.    Marland Kitchen losartan-hydrochlorothiazide (HYZAAR) 100-12.5 MG tablet TAKE 1 TABLET ONCE DAILY. 90 tablet 0  . thiamine 100 MG tablet Take 1 tablet (100 mg total) by mouth daily. 30 tablet 0   No facility-administered medications prior to visit.     ROS Review of Systems  Constitutional: Negative for diaphoresis and fatigue.  HENT: Negative.   Eyes: Negative for visual disturbance.  Respiratory: Negative for cough, chest tightness, shortness of breath and wheezing.   Cardiovascular: Negative for chest pain, palpitations and leg swelling.  Gastrointestinal: Negative for abdominal pain, constipation, diarrhea, nausea and vomiting.  Endocrine: Negative.   Genitourinary:  Negative.  Negative for difficulty urinating.  Musculoskeletal: Negative for arthralgias, joint swelling and neck stiffness.  Skin: Negative.   Neurological: Negative.  Negative for dizziness, weakness, light-headedness and headaches.  Hematological: Negative for adenopathy. Does not bruise/bleed easily.  Psychiatric/Behavioral: Positive for confusion, decreased concentration, dysphoric mood and sleep disturbance. Negative for self-injury.    Objective:  BP 130/60 (BP Location: Left Arm, Patient Position: Sitting, Cuff Size: Normal)   Pulse 64   Temp 98.7 F (37.1 C) (Oral)   Resp 16   Ht 5\' 1"  (1.549 m)   Wt 95 lb 12 oz (43.4 kg)   SpO2 99%   BMI 18.09 kg/m   BP Readings from Last 3 Encounters:  09/08/17 130/60  08/19/17 125/72  07/21/17 108/60    Wt Readings from Last 3 Encounters:  09/08/17 95 lb 12 oz (43.4 kg)  08/19/17 98 lb (44.5 kg)  07/21/17 95 lb 1.3 oz (43.1 kg)    Physical Exam  Constitutional: No distress.  HENT:  Mouth/Throat: Oropharynx is clear and moist. No oropharyngeal exudate.  Eyes: Conjunctivae are normal.  Neck: Normal range of motion. Neck supple. No JVD present. No thyromegaly present.  Cardiovascular: Normal rate, regular rhythm and normal heart sounds.  Pulmonary/Chest: Effort normal and breath sounds normal. She has no wheezes. She has no rales.  Abdominal: Soft. Bowel sounds are normal. She exhibits no mass. There is no hepatosplenomegaly. There is no tenderness.  Musculoskeletal: Normal range  of motion. She exhibits no edema, tenderness or deformity.  Lymphadenopathy:    She has no cervical adenopathy.  Neurological: She is alert.  Skin: She is not diaphoretic.  Psychiatric: Her mood appears anxious. Her speech is delayed and tangential. Her speech is not rapid and/or pressured and not slurred. She is not agitated, not aggressive, not hyperactive, not slowed and not withdrawn. Thought content is not paranoid. Cognition and memory are  impaired. She does not exhibit a depressed mood. She expresses no homicidal and no suicidal ideation. She is communicative. She exhibits abnormal recent memory and abnormal remote memory.    Lab Results  Component Value Date   WBC 3.2 (L) 04/28/2017   HGB 10.4 (L) 04/28/2017   HCT 31.7 (L) 04/28/2017   PLT 151 04/28/2017   GLUCOSE 103 (H) 04/28/2017   CHOL 206 (H) 03/17/2017   TRIG 76.0 03/17/2017   HDL 89.10 03/17/2017   LDLCALC 102 (H) 03/17/2017   ALT 18 04/27/2017   AST 30 04/27/2017   NA 136 04/28/2017   K 3.5 04/28/2017   CL 101 04/28/2017   CREATININE 0.80 04/28/2017   BUN 10 04/28/2017   CO2 25 04/28/2017   TSH 1.726 04/27/2017   HGBA1C 5.4 04/27/2017    Dg Chest 2 View  Result Date: 04/26/2017 CLINICAL DATA:  Syncopal episode and diaphoresis EXAM: CHEST - 2 VIEW COMPARISON:  10/13/2016 FINDINGS: Cardiac shadow is within normal limits. The lungs are hyperinflated consistent with COPD. Degenerative changes of the thoracic spine are noted. No focal infiltrate or sizable effusion is noted. IMPRESSION: COPD without acute abnormality. Electronically Signed   By: Inez Catalina M.D.   On: 04/26/2017 15:26   Ct Head Wo Contrast  Result Date: 04/26/2017 CLINICAL DATA:  Initial evaluation for acute presyncopal symptoms. EXAM: CT HEAD WITHOUT CONTRAST TECHNIQUE: Contiguous axial images were obtained from the base of the skull through the vertex without intravenous contrast. COMPARISON:  Prior MRI from 02/26/2017. FINDINGS: Brain: Diffuse prominence of the CSF containing spaces compatible with generalized age-related cerebral atrophy. Mild chronic microvascular changes present within the periventricular and deep white matter both cerebral hemispheres. No acute intracranial hemorrhage. No acute large vessel territory infarct. 2.9 cm meningioma overlying the right parietal convexity noted without associated mass effect or edema. No other mass lesion. No midline shift or mass effect. No  hydrocephalus. No extra-axial fluid collection. Vascular: No worrisome hyperdense vessel. Scattered vascular calcifications noted within the carotid siphons. Skull: Scalp soft tissues and calvarium within normal limits. Sinuses/Orbits: Globes orbital soft tissues demonstrate no acute abnormality. Punctate calcification noted at the posterior left globe. Paranasal sinuses and mastoid air cells are clear. Other: None. IMPRESSION: 1. No acute intracranial abnormality. 2. Generalized age-related cerebral atrophy with mild chronic small vessel ischemic disease. 3. Approximate 3 cm meningioma overlying the right parietal convexity without associated edema, stable. Electronically Signed   By: Jeannine Boga M.D.   On: 04/26/2017 16:23    Assessment & Plan:   Jniya was seen today for hypertension and anemia.  Diagnoses and all orders for this visit:  Essential hypertension-her blood pressure is well controlled.  Electrolytes and renal function are normal. -     Basic metabolic panel; Future  Iron deficiency anemia due to dietary causes-I will monitor an H&H and for now we will continue the iron supplement. -     CBC with Differential/Platelet; Future  Senile dementia with behavioral disturbance- I advised her husband that her condition will continue to decline.  I  encouraged him to hire some help within the home so that he can better manage her there and/or to make a decision to get her transferred to a memory care unit.   I am having Vicki Garcia maintain her loratadine, feeding supplement (ENSURE ENLIVE), thiamine, ferrous sulfate, losartan-hydrochlorothiazide, citalopram, latanoprost, and donepezil.  No orders of the defined types were placed in this encounter.    Follow-up: Return in about 6 months (around 03/11/2018).  Scarlette Calico, MD

## 2017-09-08 NOTE — Patient Instructions (Signed)

## 2017-09-09 ENCOUNTER — Encounter: Payer: Self-pay | Admitting: Internal Medicine

## 2017-09-13 ENCOUNTER — Encounter: Payer: Self-pay | Admitting: Nurse Practitioner

## 2017-09-15 ENCOUNTER — Ambulatory Visit: Payer: Medicare Other | Admitting: Internal Medicine

## 2017-10-03 ENCOUNTER — Other Ambulatory Visit: Payer: Self-pay | Admitting: Internal Medicine

## 2017-10-03 DIAGNOSIS — F0391 Unspecified dementia with behavioral disturbance: Secondary | ICD-10-CM

## 2017-10-03 DIAGNOSIS — F03918 Unspecified dementia, unspecified severity, with other behavioral disturbance: Secondary | ICD-10-CM

## 2017-10-05 ENCOUNTER — Other Ambulatory Visit: Payer: Self-pay | Admitting: Internal Medicine

## 2017-10-13 ENCOUNTER — Encounter: Payer: Self-pay | Admitting: Cardiovascular Disease

## 2017-10-13 ENCOUNTER — Ambulatory Visit: Payer: Medicare Other | Admitting: Cardiovascular Disease

## 2017-10-13 VITALS — BP 162/69 | HR 60 | Ht 62.0 in | Wt 96.8 lb

## 2017-10-13 DIAGNOSIS — R55 Syncope and collapse: Secondary | ICD-10-CM | POA: Diagnosis not present

## 2017-10-13 DIAGNOSIS — G301 Alzheimer's disease with late onset: Secondary | ICD-10-CM

## 2017-10-13 DIAGNOSIS — E785 Hyperlipidemia, unspecified: Secondary | ICD-10-CM

## 2017-10-13 DIAGNOSIS — F0280 Dementia in other diseases classified elsewhere without behavioral disturbance: Secondary | ICD-10-CM

## 2017-10-13 DIAGNOSIS — I779 Disorder of arteries and arterioles, unspecified: Secondary | ICD-10-CM

## 2017-10-13 DIAGNOSIS — I10 Essential (primary) hypertension: Secondary | ICD-10-CM

## 2017-10-13 DIAGNOSIS — I739 Peripheral vascular disease, unspecified: Secondary | ICD-10-CM

## 2017-10-13 NOTE — Progress Notes (Signed)
Cardiology Office Note    Date:  10/13/2017   ID:  Vicki Garcia, DOB November 18, 1935, MRN [id number]  PCP:  Janith Lima, MD  Cardiologist:  Shelva Majestic, MD   Chief Complaint  Patient presents with  . Follow-up   Initial evaluation with me  History of Present Illness:  Vicki Garcia is a 82 y.o. female who had initially seen Vicki Sims, NP on May 24, 2017 following a syncopal spell.  She presents for initial follow-up evaluation with me.  Vicki Garcia is followed by Dr. Scarlette Calico for primary care.  She has a history of hypertension, hearing impairment, hyperlipidemia, lumbar spinal stenosis and diverticulosis.  She also has been evaluated for dementia.  On April 26, 2017 in the setting of dehydration and reduced oral intake, she apparently had a syncopal spell and was evaluated in the emergency room.  She was noted to have acute kidney injury and hypokalemia in the setting of dehydration.  She was treated with IV fluid replacement with resolution of symptoms.  She has undergone evaluation with Dr. Nancy Nordmann of neurology and is felt to have late onset Alzheimer's.  An MRI of her brain revealed moderate atrophy.  Following her syncopal spell she underwent an echo Doppler study as well as carotid duplex imaging.  Her 2D echo Doppler study revealed an EF of 60 to 65% with normal wall motion.  There was grade 1 diastolic dysfunction.  There was evidence for trivial MR.  PA pressures were normal.  Carotid duplex imaging showed minimal carotid plaque and a 1 to 39% bilaterally.  When seen by Curt Bears her blood pressure was stable at 126/60.  She has been maintained on losartan HCT 100/12.5 mg for blood pressure control.  Her blood pressure at home typically runs in the low to mid 841L systolically.  She has been on Aricept for dementia.  She takes Claritin for allergies.  She denies chest tightness.  She denies palpitations.  She denies any further episodes of presyncope or syncope.  She presents for  evaluation.   Past Medical History:  Diagnosis Date  . Disorder of left rotator cuff   . Diverticulosis of colon    AVM @ colonoscopy 07/2008  . GERD (gastroesophageal reflux disease)   . History of adenomatous polyp of colon    2010  . History of esophageal dilatation    07/ 2014  . History of esophagitis    07-/ 2014  . History of melanoma excision    1987  right upper arm  . Hyperlipidemia   . Hypertension   . Prolapsed internal hemorrhoids, grade 4   . Seasonal and perennial allergic rhinitis   . Tinnitus    chronic  . Wears glasses   . Wears hearing aid    bilateral    Past Surgical History:  Procedure Laterality Date  . COLONOSCOPY  last one 07-28-2012  . HEMORRHOID SURGERY N/A 04/18/2015   Procedure: HEMORRHOIDECTOMY;  Surgeon: Leighton Ruff, MD;  Location: [facility];  Service: General;  Laterality: N/A;  . LUMBAR FUSION  11-19-2006   L4 - L5  . UPPER GASTROINTESTINAL ENDOSCOPY  07-28-2012    Current Medications: Outpatient Medications Prior to Visit  Medication Sig Dispense Refill  . citalopram (CELEXA) 10 MG tablet TAKE 1 TABLET ONCE DAILY. 90 tablet 1  . donepezil (ARICEPT) 5 MG tablet Take 1 tablet (5 mg total) by mouth at bedtime. 30 tablet 5  . feeding supplement, ENSURE ENLIVE, (ENSURE ENLIVE) LIQD Take  237 mLs by mouth 2 (two) times daily between meals. 237 mL 12  . ferrous sulfate 325 (65 FE) MG tablet Take 1 tablet (325 mg total) by mouth 2 (two) times daily with a meal. 180 tablet 1  . latanoprost (XALATAN) 0.005 % ophthalmic solution   98  . loratadine (CLARITIN) 10 MG tablet Take 10 mg by mouth daily as needed for allergies.    Marland Kitchen losartan-hydrochlorothiazide (HYZAAR) 100-12.5 MG tablet TAKE 1 TABLET ONCE DAILY. 90 tablet 1  . thiamine 100 MG tablet Take 1 tablet (100 mg total) by mouth daily. 30 tablet 0  . TRAVEL SICKNESS 25 MG CHEW TAKE  (1)  TABLET  THREE TIMES DAILY AS NEEDED FOR DIZZINESS. 30 tablet 0   No  facility-administered medications prior to visit.      Allergies:   Codeine; Namzaric [memantine hcl-donepezil hcl]; Norvasc [amlodipine besylate]; and Vasotec   Social History   Socioeconomic History  . Marital status: Married    Spouse name: Not on file  . Number of children: Not on file  . Years of education: Not on file  . Highest education level: Not on file  Occupational History  . Not on file  Social Needs  . Financial resource strain: Not on file  . Food insecurity:    Worry: Not on file    Inability: Not on file  . Transportation needs:    Medical: Not on file    Non-medical: Not on file  Tobacco Use  . Smoking status: Former Smoker    Years: 10.00    Types: Cigarettes    Last attempt to quit: 01/19/1969    Years since quitting: 48.7  . Smokeless tobacco: Never Used  Substance and Sexual Activity  . Alcohol use: Yes    Alcohol/week: 5.0 standard drinks    Types: 5 Glasses of wine per week    Comment: occasional  . Drug use: No  . Sexual activity: Not on file    Comment: G1  P1  M1  Lifestyle  . Physical activity:    Days per week: Not on file    Minutes per session: Not on file  . Stress: Not on file  Relationships  . Social connections:    Talks on phone: Not on file    Gets together: Not on file    Attends religious service: Not on file    Active member of club or organization: Not on file    Attends meetings of clubs or organizations: Not on file    Relationship status: Not on file  Other Topics Concern  . Not on file  Social History Narrative   Regular exercise: Strength Training & walking w/o symptoms           Family History:  The patient's family history includes Breast cancer in her paternal aunt; Colon cancer in her maternal uncle; Coronary artery disease in her father; Heart attack (age of onset: 24) in her paternal uncle; Hemolytic uremic syndrome in her unknown relative; Hypertension in her brother; Kidney cancer in her brother; Lung  cancer in her brother; Lymphoma in her mother; Prostate cancer in her brother; Skin cancer in her brother; Stomach cancer in her brother; Ulcers in her daughter.   ROS General: Negative; No fevers, chills, or night sweats;  HEENT: Negative; No changes in vision or hearing, sinus congestion, difficulty swallowing Pulmonary: Positive for allergies n;egative; No cough, wheezing, shortness of breath, hemoptysis Cardiovascular: Positive for hypertension; No chest pain, presyncope, syncope, palpitations  GI: Negative; No nausea, vomiting, diarrhea, or abdominal pain GU: Negative; No dysuria, hematuria, or difficulty voiding Musculoskeletal: Negative; no myalgias, joint pain, or weakness Hematologic/Oncology: Negative; no easy bruising, bleeding Endocrine: Negative; no heat/cold intolerance; no diabetes Neuro: Positive for poor memory, felt to have late onset ofAlzheimer's. Skin: Negative; No rashes or skin lesions Psychiatric: Negative; No behavioral problems, depression Sleep: Negative; No snoring, daytime sleepiness, hypersomnolence, bruxism, restless legs, hypnogognic hallucinations, no cataplexy Other comprehensive 14 point system review is negative.   PHYSICAL EXAM:   VS:  BP (!) 162/69   Pulse 60   Ht 5' 2"  (1.575 m)   Wt 96 lb 12.8 oz (43.9 kg)   BMI 17.70 kg/m     Repeat blood pressure by me was 140/70 supine and 130/70 standing.  No significant orthostatic pulse rise.  Wt Readings from Last 3 Encounters:  10/13/17 96 lb 12.8 oz (43.9 kg)  09/08/17 95 lb 12 oz (43.4 kg)  08/19/17 98 lb (44.5 kg)    General: Alert, oriented, no distress.  Skin: normal turgor, no rashes, warm and dry HEENT: Normocephalic, atraumatic. Pupils equal round and reactive to light; sclera anicteric; extraocular muscles intact; Fundi without hemorrhages or exudates.  Discs flat.  No arcus senilis Nose without nasal septal hypertrophy Mouth/Parynx benign; Mallinpatti scale 3 Neck: No JVD, no carotid  bruits; normal carotid upstroke Lungs: clear to ausculatation and percussion; no wheezing or rales Chest wall: without tenderness to palpitation Heart: PMI not displaced, RRR, s1 s2 normal, 1/6 systolic murmur, no diastolic murmur, no rubs, gallops, thrills, or heaves Abdomen: soft, nontender; no hepatosplenomehaly, BS+; abdominal aorta nontender and not dilated by palpation. Back: no CVA tenderness Pulses 2+ Musculoskeletal: full range of motion, normal strength, no joint deformities Extremities: no clubbing cyanosis or edema, Homan's sign negative  Neurologic: grossly nonfocal; Cranial nerves grossly wnl Psychologic: Normal mood and affect   Studies/Labs Reviewed:   EKG:  EKG is ordered today.  ECG (independently read by me): Normal sinus rhythm at 80 bpm.  QS complex V1-V3  Recent Labs: BMP Latest Ref Rng & Units 04/28/2017 04/27/2017 04/26/2017  Glucose 65 - 99 mg/dL 103(H) 110(H) 101(H)  BUN 6 - 20 mg/dL 10 16 32(H)  Creatinine 0.44 - 1.00 mg/dL 0.80 0.88 1.44(H)  Sodium 135 - 145 mmol/L 136 139 137  Potassium 3.5 - 5.1 mmol/L 3.5 3.0(L) 3.6  Chloride 101 - 111 mmol/L 101 104 101  CO2 22 - 32 mmol/L 25 28 23   Calcium 8.9 - 10.3 mg/dL 8.6(L) 8.9 8.8(L)     Hepatic Function Latest Ref Rng & Units 04/27/2017 04/26/2017 03/17/2017  Total Protein 6.5 - 8.1 g/dL 5.5(L) 6.2(L) 7.1  Albumin 3.5 - 5.0 g/dL 3.2(L) 3.6 4.2  AST 15 - 41 U/L 30 36 18  ALT 14 - 54 U/L 18 21 14   Alk Phosphatase 38 - 126 U/L 39 47 65  Total Bilirubin 0.3 - 1.2 mg/dL 0.3 0.6 0.9  Bilirubin, Direct 0.0 - 0.3 mg/dL - - -    CBC Latest Ref Rng & Units 04/28/2017 04/27/2017 04/26/2017  WBC 4.0 - 10.5 K/uL 3.2(L) 2.6(L) 4.3  Hemoglobin 12.0 - 15.0 g/dL 10.4(L) 11.2(L) 11.6(L)  Hematocrit 36.0 - 46.0 % 31.7(L) 34.5(L) 36.0  Platelets 150 - 400 K/uL 151 159 158   Lab Results  Component Value Date   MCV 90.8 04/28/2017   MCV 91.0 04/27/2017   MCV 92.1 04/26/2017   Lab Results  Component Value Date   TSH 1.726  04/27/2017  Lab Results  Component Value Date   HGBA1C 5.4 04/27/2017     BNP No results found for: BNP  ProBNP No results found for: PROBNP   Lipid Panel     Component Value Date/Time   CHOL 206 (H) 03/17/2017 1122   CHOL 154 06/28/2013 1440   TRIG 76.0 03/17/2017 1122   TRIG 75 06/28/2013 1440   TRIG 54 12/15/2005 0859   HDL 89.10 03/17/2017 1122   HDL 71 06/28/2013 1440   CHOLHDL 2 03/17/2017 1122   VLDL 15.2 03/17/2017 1122   LDLCALC 102 (H) 03/17/2017 1122   LDLCALC 68 06/28/2013 1440     RADIOLOGY: No results found.   Additional studies/ records that were reviewed today include:   reviewed the patient's emergency room evaluation, 2D echo Doppler study, as well as carotid duplex imaging in addition to the prior evaluation from Ms. Purcell Nails, NP   ASSESSMENT:    1. Syncope, unspecified syncope type   2. Essential hypertension   3. Hyperlipidemia, unspecified hyperlipidemia type   4. Late onset Alzheimer's disease without behavioral disturbance   5. Mild carotid artery disease ([place])     PLAN:  I Ms. Karysa Heft is an 82 year old female who has a history of hypertension and has been maintained on losartan HCT 100/12.5 mg.  She had recently developed an episode of syncope which was short-lived and this seemed to occur in the setting of dehydration.  At the time she was mildly hypokalemic and had mild acute kidney injury with creatinine increasing to 1.44.  She was treated with IV fluids with resolution of symptomatology.  Subsequent work-up has been essentially negative with reference to cardiac etiology.  She does not have significant structural heart disease and had normal ejection fraction at 60 to 65% with mild grade 1 diastolic dysfunction on her echo Doppler assessment.  She did not have any significant carotid stenosis on duplex imaging and had bilateral antegrade flow in both vertebral arteries and normal flow hemodynamics in both subclavian arteries.  At  present, her blood pressure is upper normal to slightly increased without significant orthostatic change.  Her ECG is stable and shows sinus rhythm.  She has poor R wave progression anteroseptally but had entirely normal wall motion on her echo cardiographic evaluation.  She has dementia with poor memory and is taking Aricept.  Presently, with her recent low blood pressure I have recommended continued medical therapy with her present regimen of losartan HCT and will not add an additional agent.  She has mild hyperlipidemia with LDL cholesterol at 102 and total cholesterol 206 on lipid testing in February 2019.  She is followed by Dr. Ronnald Ramp.  Presently, I feel she is stable from a cardiovascular basis.  When she is seen in follow-up by Dr. Ronnald Ramp I would recommend follow-up laboratory including repeat lipid studies.  As long as she remains stable I will be available to see her on an as-needed basis but will not give her an appointment presently.  Medication Adjustments/Labs and Tests Ordered: Current medicines are reviewed at length with the patient today.  Concerns regarding medicines are outlined above.  Medication changes, Labs and Tests ordered today are listed in the Patient Instructions below. Patient Instructions  Medication Instructions:  Your physician recommends that you continue on your current medications as directed. Please refer to the Current Medication list given to you today.  Follow-Up: As needed with Dr. Claiborne Billings  Any Other Special Instructions Will Be Listed Below (If Applicable).  If you need a refill on your cardiac medications before your next appointment, please call your pharmacy.      Signed, Shelva Majestic, MD  10/13/2017 11:38 AM    [facility] [address], [place], [place], [place]  [address] Phone: [phone]

## 2017-10-13 NOTE — Patient Instructions (Signed)
Medication Instructions:  Your physician recommends that you continue on your current medications as directed. Please refer to the Current Medication list given to you today.  Follow-Up: As needed with Dr. Claiborne Billings  Any Other Special Instructions Will Be Listed Below (If Applicable).     If you need a refill on your cardiac medications before your next appointment, please call your pharmacy.

## 2017-11-17 ENCOUNTER — Other Ambulatory Visit: Payer: Self-pay | Admitting: Neurology

## 2017-11-22 ENCOUNTER — Other Ambulatory Visit: Payer: Self-pay | Admitting: Neurology

## 2017-12-08 ENCOUNTER — Telehealth: Payer: Self-pay | Admitting: Internal Medicine

## 2017-12-08 NOTE — Telephone Encounter (Signed)
Copied from [place] [id number]. Topic: Quick Communication - See Telephone Encounter >> Dec 08, 2017  3:32 PM Rutherford Nail, [place] wrote: CRM for notification. See Telephone encounter for: 12/08/17. Patient's husband calling and states that the patient has had a cough for a few day. States thst it gets worse in the evening and at night. Would like to know if medication could be sent to the pharmacy? GATE Spencer, [address] [address].

## 2017-12-08 NOTE — Telephone Encounter (Signed)
Pt needs to come in for an appt

## 2017-12-08 NOTE — Telephone Encounter (Signed)
Appointment scheduled.

## 2017-12-09 ENCOUNTER — Ambulatory Visit: Payer: Medicare Other | Admitting: Family

## 2017-12-09 ENCOUNTER — Encounter: Payer: Self-pay | Admitting: Family

## 2017-12-09 VITALS — BP 134/60 | HR 60 | Temp 99.0°F | Ht 62.0 in | Wt 99.0 lb

## 2017-12-09 DIAGNOSIS — J209 Acute bronchitis, unspecified: Secondary | ICD-10-CM

## 2017-12-09 DIAGNOSIS — Z23 Encounter for immunization: Secondary | ICD-10-CM | POA: Diagnosis not present

## 2017-12-09 MED ORDER — AZITHROMYCIN 250 MG PO TABS: ORAL_TABLET | ORAL | 0 refills | Status: DC

## 2017-12-09 MED ORDER — BENZONATATE 100 MG PO CAPS: 100.0000 mg | ORAL_CAPSULE | Freq: Three times a day (TID) | ORAL | 0 refills | Status: DC | PRN

## 2017-12-09 NOTE — Progress Notes (Signed)
Vicki Garcia is a 82 y.o. female with the following history as recorded in EpicCare:  Patient Active Problem List   Diagnosis Date Noted  . Iron deficiency anemia due to dietary causes 05/05/2017  . Chronic cough 03/06/2016  . Senile dementia with behavioral disturbance ([place]) 10/24/2014  . Benign esophageal stricture 07/30/2014  . Other abnormal glucose 07/02/2013  . Seasonal and perennial allergic rhinitis 10/29/2008  . MELANOMA, UPPER ARM 04/04/2008  . Essential hypertension 02/22/2007  . Hyperlipidemia LDL goal <160 02/18/2007    Current Outpatient Medications  Medication Sig Dispense Refill  . citalopram (CELEXA) 10 MG tablet TAKE 1 TABLET ONCE DAILY. 90 tablet 1  . donepezil (ARICEPT) 5 MG tablet Take 1 tablet (5 mg total) by mouth at bedtime. 30 tablet 5  . donepezil (ARICEPT) 5 MG tablet TAKE ONE TABLET AT BEDTIME. 90 tablet 0  . feeding supplement, ENSURE ENLIVE, (ENSURE ENLIVE) LIQD Take 237 mLs by mouth 2 (two) times daily between meals. 237 mL 12  . ferrous sulfate 325 (65 FE) MG tablet Take 1 tablet (325 mg total) by mouth 2 (two) times daily with a meal. 180 tablet 1  . latanoprost (XALATAN) 0.005 % ophthalmic solution   98  . loratadine (CLARITIN) 10 MG tablet Take 10 mg by mouth daily as needed for allergies.    Marland Kitchen losartan-hydrochlorothiazide (HYZAAR) 100-12.5 MG tablet TAKE 1 TABLET ONCE DAILY. 90 tablet 1  . thiamine 100 MG tablet Take 1 tablet (100 mg total) by mouth daily. 30 tablet 0  . TRAVEL SICKNESS 25 MG CHEW TAKE  (1)  TABLET  THREE TIMES DAILY AS NEEDED FOR DIZZINESS. 30 tablet 0  . azithromycin (ZITHROMAX) 250 MG tablet 2 tabs po qd x 1 day; 1 tablet per day x 4 days; 6 tablet 0  . benzonatate (TESSALON) 100 MG capsule Take 1 capsule (100 mg total) by mouth 3 (three) times daily as needed. 20 capsule 0   No current facility-administered medications for this visit.     Allergies: Codeine; Namzaric [memantine hcl-donepezil hcl]; Norvasc [amlodipine besylate];  and Vasotec  Past Medical History:  Diagnosis Date  . Disorder of left rotator cuff   . Diverticulosis of colon    AVM @ colonoscopy 07/2008  . GERD (gastroesophageal reflux disease)   . History of adenomatous polyp of colon    2010  . History of esophageal dilatation    07/ 2014  . History of esophagitis    07-/ 2014  . History of melanoma excision    1987  right upper arm  . Hyperlipidemia   . Hypertension   . Prolapsed internal hemorrhoids, grade 4   . Seasonal and perennial allergic rhinitis   . Tinnitus    chronic  . Wears glasses   . Wears hearing aid    bilateral    Past Surgical History:  Procedure Laterality Date  . COLONOSCOPY  last one 07-28-2012  . HEMORRHOID SURGERY N/A 04/18/2015   Procedure: HEMORRHOIDECTOMY;  Surgeon: Leighton Ruff, MD;  Location[facility];  Service: General;  Laterality: N/A;  . LUMBAR FUSION  11-19-2006   L4 - L5  . UPPER GASTROINTESTINAL ENDOSCOPY  07-28-2012    Family History  Problem Relation Age of Onset  . Skin cancer Brother        also spinal stenosis  . Kidney cancer Brother   . Stomach cancer Brother   . Lung cancer Brother        liver mets  . Coronary  artery disease Father   . Hypertension Brother   . Prostate cancer Brother   . Breast cancer Paternal Aunt   . Heart attack Paternal Uncle 45  . Lymphoma Mother        NHL  . Hemolytic uremic syndrome Unknown        granddaughter  . Colon cancer Maternal Uncle   . Ulcers Daughter   . Stroke Neg Hx     Social History   Tobacco Use  . Smoking status: Former Smoker    Years: 10.00    Types: Cigarettes    Last attempt to quit: 01/19/1969    Years since quitting: 48.9  . Smokeless tobacco: Never Used  Substance Use Topics  . Alcohol use: Yes    Alcohol/week: 5.0 standard drinks    Types: 5 Glasses of wine per week    Comment: occasional    Subjective:  Patient presents for cough/ congestion x 1 week; using Claritin for symptoms relief; no fever;  no chest pain, no shortness of breath; accompanied by her husband today who helps provide history;    Objective:  Vitals:   12/09/17 1419  BP: 134/60  Pulse: 60  Temp: 99 F (37.2 C)  TempSrc: Oral  SpO2: 94%  Weight: 99 lb 0.6 oz (44.9 kg)  Height: 5\' 2"  (1.575 m)    General: Well developed, well nourished, in no acute distress  Skin : Warm and dry.  Head: Normocephalic and atraumatic  Eyes: Sclera and conjunctiva clear; pupils round and reactive to light; extraocular movements intact  Ears: External normal; canals clear; tympanic membranes normal  Oropharynx: Pink, supple. No suspicious lesions  Neck: Supple without thyromegaly, adenopathy  Lungs: Respirations unlabored; clear to auscultation bilaterally without wheeze, rales, rhonchi  CVS exam: normal rate and regular rhythm.  Neurologic: Alert and oriented; speech intact; face symmetrical; moves all extremities well; CNII-XII intact without focal deficit   Assessment:  1. Acute bronchitis, unspecified organism   2. Need for influenza vaccination     Plan:  1. Suspect allergy component; encouraged to use Flonase and Claritin; Rx for Z-pak #1 take as directed; follow-up worse, no better. 2. Due to difficulty getting patient to office, feel benefit outweighs risk and will give flu shot today;   No follow-ups on file.  Orders Placed This Encounter  Procedures  . Flu vaccine HIGH DOSE PF    Requested Prescriptions   Signed Prescriptions Disp Refills  . azithromycin (ZITHROMAX) 250 MG tablet 6 tablet 0    Sig: 2 tabs po qd x 1 day; 1 tablet per day x 4 days;  . benzonatate (TESSALON) 100 MG capsule 20 capsule 0    Sig: Take 1 capsule (100 mg total) by mouth 3 (three) times daily as needed.

## 2017-12-13 ENCOUNTER — Ambulatory Visit: Payer: Medicare Other | Admitting: Family

## 2017-12-13 ENCOUNTER — Other Ambulatory Visit (INDEPENDENT_AMBULATORY_CARE_PROVIDER_SITE_OTHER): Payer: Medicare Other

## 2017-12-13 ENCOUNTER — Ambulatory Visit (INDEPENDENT_AMBULATORY_CARE_PROVIDER_SITE_OTHER)
Admission: RE | Admit: 2017-12-13 | Discharge: 2017-12-13 | Disposition: A | Discharge status: Home / Self Care | Payer: Medicare Other | Source: Ambulatory Visit | Attending: Family | Admitting: Family

## 2017-12-13 ENCOUNTER — Encounter: Payer: Self-pay | Admitting: Family

## 2017-12-13 VITALS — BP 130/62 | HR 90 | Temp 99.0°F | Ht 62.0 in

## 2017-12-13 DIAGNOSIS — R05 Cough: Secondary | ICD-10-CM

## 2017-12-13 DIAGNOSIS — R55 Syncope and collapse: Secondary | ICD-10-CM

## 2017-12-13 DIAGNOSIS — R059 Cough, unspecified: Secondary | ICD-10-CM

## 2017-12-13 DIAGNOSIS — J209 Acute bronchitis, unspecified: Secondary | ICD-10-CM

## 2017-12-13 LAB — COMPREHENSIVE METABOLIC PANEL
ALBUMIN: 4 g/dL (ref 3.5–5.2)
ALK PHOS: 50 U/L (ref 39–117)
ALT: 18 U/L (ref 0–35)
AST: 20 U/L (ref 0–37)
BILIRUBIN TOTAL: 0.4 mg/dL (ref 0.2–1.2)
BUN: 15 mg/dL (ref 6–23)
CALCIUM: 9.5 mg/dL (ref 8.4–10.5)
CO2: 32 mEq/L (ref 19–32)
CREATININE: 0.81 mg/dL (ref 0.40–1.20)
Chloride: 98 mEq/L (ref 96–112)
GFR: 72.17 mL/min (ref >60.00)
Glucose, Bld: 98 mg/dL (ref 70–99)
Potassium: 3.8 mEq/L (ref 3.5–5.1)
SODIUM: 138 meq/L (ref 135–145)
TOTAL PROTEIN: 6.8 g/dL (ref 6.0–8.3)

## 2017-12-13 LAB — CBC WITH DIFFERENTIAL/PLATELET
BASOS ABS: 0 10*3/uL (ref 0.0–0.1)
Basophils Relative: 0.4 % (ref 0.0–3.0)
EOS ABS: 0 10*3/uL (ref 0.0–0.7)
Eosinophils Relative: 0.2 % (ref 0.0–5.0)
HCT: 34.7 % — ABNORMAL LOW (ref 36.0–46.0)
HEMOGLOBIN: 11.8 g/dL — AB (ref 12.0–15.0)
Lymphocytes Relative: 14.1 % (ref 12.0–46.0)
Lymphs Abs: 0.7 10*3/uL (ref 0.7–4.0)
MCHC: 34.1 g/dL (ref 30.0–36.0)
MCV: 91.1 fl (ref 78.0–100.0)
MONO ABS: 0.7 10*3/uL (ref 0.1–1.0)
Monocytes Relative: 15 % — ABNORMAL HIGH (ref 3.0–12.0)
Neutro Abs: 3.4 10*3/uL (ref 1.4–7.7)
Neutrophils Relative %: 70.3 % (ref 43.0–77.0)
Platelets: 176 10*3/uL (ref 150.0–400.0)
RBC: 3.81 Mil/uL — AB (ref 3.87–5.11)
RDW: 12.5 % (ref 11.5–15.5)
WBC: 4.8 10*3/uL (ref 4.0–10.5)

## 2017-12-13 MED ORDER — PREDNISONE 20 MG PO TABS: 20.0000 mg | ORAL_TABLET | Freq: Every day | ORAL | 0 refills | Status: DC

## 2017-12-13 MED ORDER — DOXYCYCLINE HYCLATE 100 MG PO TABS: 100.0000 mg | ORAL_TABLET | Freq: Two times a day (BID) | ORAL | 0 refills | Status: DC

## 2017-12-13 MED ORDER — PROMETHAZINE-DM 6.25-15 MG/5ML PO SYRP: 5.0000 mL | ORAL_SOLUTION | Freq: Four times a day (QID) | ORAL | 0 refills | Status: DC | PRN

## 2017-12-13 NOTE — Progress Notes (Signed)
Vicki Garcia is a 82 y.o. female with the following history as recorded in EpicCare:  Patient Active Problem List   Diagnosis Date Noted  . Iron deficiency anemia due to dietary causes 05/05/2017  . Chronic cough 03/06/2016  . Senile dementia with behavioral disturbance ([place]) 10/24/2014  . Benign esophageal stricture 07/30/2014  . Other abnormal glucose 07/02/2013  . Seasonal and perennial allergic rhinitis 10/29/2008  . MELANOMA, UPPER ARM 04/04/2008  . Essential hypertension 02/22/2007  . Hyperlipidemia LDL goal <160 02/18/2007    Current Outpatient Medications  Medication Sig Dispense Refill  . benzonatate (TESSALON) 100 MG capsule Take 1 capsule (100 mg total) by mouth 3 (three) times daily as needed. 20 capsule 0  . citalopram (CELEXA) 10 MG tablet TAKE 1 TABLET ONCE DAILY. 90 tablet 1  . donepezil (ARICEPT) 5 MG tablet Take 1 tablet (5 mg total) by mouth at bedtime. 30 tablet 5  . donepezil (ARICEPT) 5 MG tablet TAKE ONE TABLET AT BEDTIME. 90 tablet 0  . feeding supplement, ENSURE ENLIVE, (ENSURE ENLIVE) LIQD Take 237 mLs by mouth 2 (two) times daily between meals. 237 mL 12  . ferrous sulfate 325 (65 FE) MG tablet Take 1 tablet (325 mg total) by mouth 2 (two) times daily with a meal. 180 tablet 1  . latanoprost (XALATAN) 0.005 % ophthalmic solution   98  . loratadine (CLARITIN) 10 MG tablet Take 10 mg by mouth daily as needed for allergies.    Marland Kitchen losartan-hydrochlorothiazide (HYZAAR) 100-12.5 MG tablet TAKE 1 TABLET ONCE DAILY. 90 tablet 1  . thiamine 100 MG tablet Take 1 tablet (100 mg total) by mouth daily. 30 tablet 0  . TRAVEL SICKNESS 25 MG CHEW TAKE  (1)  TABLET  THREE TIMES DAILY AS NEEDED FOR DIZZINESS. 30 tablet 0  . doxycycline (VIBRA-TABS) 100 MG tablet Take 1 tablet (100 mg total) by mouth 2 (two) times daily. 20 tablet 0  . predniSONE (DELTASONE) 20 MG tablet Take 1 tablet (20 mg total) by mouth daily with breakfast. 5 tablet 0  . promethazine-dextromethorphan  (PROMETHAZINE-DM) 6.25-15 MG/5ML syrup Take 5 mLs by mouth 4 (four) times daily as needed for cough. 118 mL 0   No current facility-administered medications for this visit.     Allergies: Codeine; Namzaric [memantine hcl-donepezil hcl]; Norvasc [amlodipine besylate]; and Vasotec  Past Medical History:  Diagnosis Date  . Disorder of left rotator cuff   . Diverticulosis of colon    AVM @ colonoscopy 07/2008  . GERD (gastroesophageal reflux disease)   . History of adenomatous polyp of colon    2010  . History of esophageal dilatation    07/ 2014  . History of esophagitis    07-/ 2014  . History of melanoma excision    1987  right upper arm  . Hyperlipidemia   . Hypertension   . Prolapsed internal hemorrhoids, grade 4   . Seasonal and perennial allergic rhinitis   . Tinnitus    chronic  . Wears glasses   . Wears hearing aid    bilateral    Past Surgical History:  Procedure Laterality Date  . COLONOSCOPY  last one 07-28-2012  . HEMORRHOID SURGERY N/A 04/18/2015   Procedure: HEMORRHOIDECTOMY;  Surgeon: Leighton Ruff, MD;  Location: [facility];  Service: General;  Laterality: N/A;  . LUMBAR FUSION  11-19-2006   L4 - L5  . UPPER GASTROINTESTINAL ENDOSCOPY  07-28-2012    Family History  Problem Relation Age of Onset  .  Skin cancer Brother        also spinal stenosis  . Kidney cancer Brother   . Stomach cancer Brother   . Lung cancer Brother        liver mets  . Coronary artery disease Father   . Hypertension Brother   . Prostate cancer Brother   . Breast cancer Paternal Aunt   . Heart attack Paternal Uncle 22  . Lymphoma Mother        NHL  . Hemolytic uremic syndrome Unknown        granddaughter  . Colon cancer Maternal Uncle   . Ulcers Daughter   . Stroke Neg Hx     Social History   Tobacco Use  . Smoking status: Former Smoker    Years: 10.00    Types: Cigarettes    Last attempt to quit: 01/19/1969    Years since quitting: 48.9  . Smokeless  tobacco: Never Used  Substance Use Topics  . Alcohol use: Yes    Alcohol/week: 5.0 standard drinks    Types: 5 Glasses of wine per week    Comment: occasional    Subjective:  Patient presents with her husband; worsening cough/ chest congestion; seen on Friday-  Was started on Z-pak but symptoms have worsened; no fever; no wheezing;  Per husband, she "blacked out" leaving church yesterday; EMS was called to check on her at the church- normal EKG;    Objective:  Vitals:   12/13/17 1050  BP: 130/62  Pulse: 90  Temp: 99 F (37.2 C)  TempSrc: Oral  SpO2: 96%  Height: 5' 2"  (1.575 m)    General: Well developed, well nourished, in no acute distress  Skin : Warm and dry.  Head: Normocephalic and atraumatic  Eyes: Sclera and conjunctiva clear; pupils round and reactive to light; extraocular movements intact  Ears: External normal; canals clear; tympanic membranes normal  Oropharynx: Pink, supple. No suspicious lesions  Neck: Supple without thyromegaly, adenopathy  Lungs: Respirations unlabored; clear to auscultation bilaterally without wheeze, rales, rhonchi  CVS exam: normal rate and regular rhythm.  Abdomen: Soft; nontender; nondistended; normoactive bowel sounds; no masses or hepatosplenomegaly  Musculoskeletal: No deformities; no active joint inflammation  Extremities: No edema, cyanosis, clubbing  Vessels: Symmetric bilaterally  Neurologic: Alert and oriented; speech intact; face symmetrical; moves all extremities well; CNII-XII intact without focal deficit   Assessment:  1. Cough   2. Acute bronchitis, unspecified organism   3. Syncope, unspecified syncope type     Plan:  1. & 2. Update CXR today; d/c Z-pak; change to Doxycycline 100 mg bid x 10 days, Prednisone 20 mg qd x 5 days; increase fluids, rest and follow-up worse, no better. 3. EKG updated yesterday with EMS; will update labs today to include CBC, CMP; this is not a new issue for patient- has been evaluated by  cardiology in the past 3 months;   No follow-ups on file.  Orders Placed This Encounter  Procedures  . DG Chest 2 View    Standing Status:   Future    Number of Occurrences:   1    Standing Expiration Date:   02/13/2019    Order Specific Question:   Reason for Exam (SYMPTOM  OR DIAGNOSIS REQUIRED)    Answer:   cough    Order Specific Question:   Preferred imaging location?    Answer:   [facility]    Order Specific Question:   Radiology Contrast Protocol - do NOT remove file  path    Answer:   [email or web address]  . CBC w/Diff    Standing Status:   Future    Standing Expiration Date:   12/13/2018  . CBC w/Diff    Standing Status:   Future    Number of Occurrences:   1    Standing Expiration Date:   12/13/2018  . Comp Met (CMET)    Standing Status:   Future    Number of Occurrences:   1    Standing Expiration Date:   12/13/2018    Requested Prescriptions   Signed Prescriptions Disp Refills  . doxycycline (VIBRA-TABS) 100 MG tablet 20 tablet 0    Sig: Take 1 tablet (100 mg total) by mouth 2 (two) times daily.  . predniSONE (DELTASONE) 20 MG tablet 5 tablet 0    Sig: Take 1 tablet (20 mg total) by mouth daily with breakfast.  . promethazine-dextromethorphan (PROMETHAZINE-DM) 6.25-15 MG/5ML syrup 118 mL 0    Sig: Take 5 mLs by mouth 4 (four) times daily as needed for cough.

## 2017-12-14 ENCOUNTER — Telehealth: Payer: Self-pay

## 2017-12-14 NOTE — Telephone Encounter (Signed)
Patient's daughter,Densie, called following up on the patient's labs. I read her your message but she had a few other questions.  She can be reached at [phone].

## 2017-12-14 NOTE — Telephone Encounter (Signed)
Spoke with patient's daughter today and all questions answered. ?

## 2017-12-14 NOTE — Telephone Encounter (Signed)
I called patient's home number and left message. I did not have a number to call the daughter back as it was not provided in CRM note and the numbers listed in the chart do not have her on the voicemail numbers. Created CRM incase she calls back.

## 2017-12-21 ENCOUNTER — Other Ambulatory Visit: Payer: Self-pay | Admitting: Family

## 2017-12-21 DIAGNOSIS — J209 Acute bronchitis, unspecified: Secondary | ICD-10-CM

## 2017-12-21 NOTE — Telephone Encounter (Signed)
Spoke with patient's husband and he states she still needs refill on cough syrup because she is still continuing to cough and isn't feeling much better. He said he may end up bringing her back in [place]).

## 2017-12-21 NOTE — Telephone Encounter (Signed)
Please call and check on her today; does she need a refill on the cough syrup?

## 2017-12-21 NOTE — Telephone Encounter (Signed)
Yes, I think she does need to come back in for a re-check.

## 2017-12-23 ENCOUNTER — Other Ambulatory Visit: Payer: Self-pay | Admitting: Family

## 2017-12-23 ENCOUNTER — Ambulatory Visit: Payer: Medicare Other | Admitting: Family Medicine

## 2017-12-23 DIAGNOSIS — J209 Acute bronchitis, unspecified: Secondary | ICD-10-CM

## 2017-12-24 ENCOUNTER — Encounter: Payer: Self-pay | Admitting: Family

## 2017-12-24 ENCOUNTER — Ambulatory Visit: Payer: Medicare Other | Admitting: Family

## 2017-12-24 VITALS — BP 128/62 | HR 84 | Temp 98.7°F | Ht 62.0 in | Wt 98.1 lb

## 2017-12-24 DIAGNOSIS — R05 Cough: Secondary | ICD-10-CM

## 2017-12-24 DIAGNOSIS — R059 Cough, unspecified: Secondary | ICD-10-CM

## 2017-12-24 DIAGNOSIS — J9801 Acute bronchospasm: Secondary | ICD-10-CM | POA: Diagnosis not present

## 2017-12-24 MED ORDER — FLUTICASONE PROPIONATE 50 MCG/ACT NA SUSP: 2.0000 | Freq: Every day | NASAL | 6 refills | Status: DC

## 2017-12-24 MED ORDER — BENZONATATE 100 MG PO CAPS: 100.0000 mg | ORAL_CAPSULE | Freq: Three times a day (TID) | ORAL | 0 refills | Status: DC | PRN

## 2017-12-24 MED ORDER — FLUTICASONE FUROATE-VILANTEROL 200-25 MCG/INH IN AEPB: 1.0000 | INHALATION_SPRAY | Freq: Every day | RESPIRATORY_TRACT | Status: DC

## 2017-12-24 NOTE — Progress Notes (Signed)
Vicki Garcia is a 82 y.o. female with the following history as recorded in EpicCare:  Patient Active Problem List   Diagnosis Date Noted  . Iron deficiency anemia due to dietary causes 05/05/2017  . Chronic cough 03/06/2016  . Senile dementia with behavioral disturbance ([place]) 10/24/2014  . Benign esophageal stricture 07/30/2014  . Other abnormal glucose 07/02/2013  . Seasonal and perennial allergic rhinitis 10/29/2008  . MELANOMA, UPPER ARM 04/04/2008  . Essential hypertension 02/22/2007  . Hyperlipidemia LDL goal <160 02/18/2007    Current Outpatient Medications  Medication Sig Dispense Refill  . benzonatate (TESSALON) 100 MG capsule Take 1 capsule (100 mg total) by mouth 3 (three) times daily as needed. 30 capsule 0  . citalopram (CELEXA) 10 MG tablet TAKE 1 TABLET ONCE DAILY. 90 tablet 1  . donepezil (ARICEPT) 5 MG tablet Take 1 tablet (5 mg total) by mouth at bedtime. 30 tablet 5  . donepezil (ARICEPT) 5 MG tablet TAKE ONE TABLET AT BEDTIME. 90 tablet 0  . doxycycline (VIBRA-TABS) 100 MG tablet Take 1 tablet (100 mg total) by mouth 2 (two) times daily. 20 tablet 0  . feeding supplement, ENSURE ENLIVE, (ENSURE ENLIVE) LIQD Take 237 mLs by mouth 2 (two) times daily between meals. 237 mL 12  . ferrous sulfate 325 (65 FE) MG tablet Take 1 tablet (325 mg total) by mouth 2 (two) times daily with a meal. 180 tablet 1  . latanoprost (XALATAN) 0.005 % ophthalmic solution   98  . loratadine (CLARITIN) 10 MG tablet Take 10 mg by mouth daily as needed for allergies.    Marland Kitchen losartan-hydrochlorothiazide (HYZAAR) 100-12.5 MG tablet TAKE 1 TABLET ONCE DAILY. 90 tablet 1  . promethazine-dextromethorphan (PROMETHAZINE-DM) 6.25-15 MG/5ML syrup TAKE 1 TEASPOONFUL (5ML) FOUR TIMES DAILY AS NEEDED FOR COUGH. 118 mL 0  . thiamine 100 MG tablet Take 1 tablet (100 mg total) by mouth daily. 30 tablet 0  . TRAVEL SICKNESS 25 MG CHEW TAKE  (1)  TABLET  THREE TIMES DAILY AS NEEDED FOR DIZZINESS. 30 tablet 0  .  fluticasone (FLONASE) 50 MCG/ACT nasal spray Place 2 sprays into both nostrils daily. 16 g 6  . fluticasone furoate-vilanterol (BREO ELLIPTA) 200-25 MCG/INH AEPB Inhale 1 puff into the lungs daily.     No current facility-administered medications for this visit.     Allergies: Codeine; Namzaric [memantine hcl-donepezil hcl]; Norvasc [amlodipine besylate]; and Vasotec  Past Medical History:  Diagnosis Date  . Disorder of left rotator cuff   . Diverticulosis of colon    AVM @ colonoscopy 07/2008  . GERD (gastroesophageal reflux disease)   . History of adenomatous polyp of colon    2010  . History of esophageal dilatation    07/ 2014  . History of esophagitis    07-/ 2014  . History of melanoma excision    1987  right upper arm  . Hyperlipidemia   . Hypertension   . Prolapsed internal hemorrhoids, grade 4   . Seasonal and perennial allergic rhinitis   . Tinnitus    chronic  . Wears glasses   . Wears hearing aid    bilateral    Past Surgical History:  Procedure Laterality Date  . COLONOSCOPY  last one 07-28-2012  . HEMORRHOID SURGERY N/A 04/18/2015   Procedure: HEMORRHOIDECTOMY;  Surgeon: Leighton Ruff, MD;  Location: [facility];  Service: General;  Laterality: N/A;  . LUMBAR FUSION  11-19-2006   L4 - L5  . UPPER GASTROINTESTINAL ENDOSCOPY  07-28-2012  Family History  Problem Relation Age of Onset  . Skin cancer Brother        also spinal stenosis  . Kidney cancer Brother   . Stomach cancer Brother   . Lung cancer Brother        liver mets  . Coronary artery disease Father   . Hypertension Brother   . Prostate cancer Brother   . Breast cancer Paternal Aunt   . Heart attack Paternal Uncle 69  . Lymphoma Mother        NHL  . Hemolytic uremic syndrome Unknown        granddaughter  . Colon cancer Maternal Uncle   . Ulcers Daughter   . Stroke Neg Hx     Social History   Tobacco Use  . Smoking status: Former Smoker    Years: 10.00    Types:  Cigarettes    Last attempt to quit: 01/19/1969    Years since quitting: 48.9  . Smokeless tobacco: Never Used  Substance Use Topics  . Alcohol use: Yes    Alcohol/week: 5.0 standard drinks    Types: 5 Glasses of wine per week    Comment: occasional    Subjective:  Seen in follow-up for acute bronchitis; CXR done on 11/25 showed COPD changes/ no pneumonia; presents with her husband for persisting cough; no fever, no chest pain, no shortness of breath; not taking any type of allergy medication.   Objective:  Vitals:   12/24/17 1535  BP: 128/62  Pulse: 84  Temp: 98.7 F (37.1 C)  TempSrc: Oral  SpO2: 97%  Weight: 98 lb 1.9 oz (44.5 kg)  Height: 5\' 2"  (1.575 m)    General: Well developed, well nourished, in no acute distress  Skin : Warm and dry.  Head: Normocephalic and atraumatic  Eyes: Sclera and conjunctiva clear; pupils round and reactive to light; extraocular movements intact  Ears: External normal; canals clear; tympanic membranes normal  Oropharynx: Pink, supple. No suspicious lesions; post-nasal drainage noted  Neck: Supple without thyromegaly, adenopathy  Lungs: Respirations unlabored; clear to auscultation bilaterally without wheeze, rales, rhonchi  CVS exam: normal rate and regular rhythm.  Neurologic: Alert and oriented; speech intact; face symmetrical; moves all extremities well; CNII-XII intact without focal deficit   Assessment:  1. Cough   2. Bronchospasm     Plan:  Do not feel lingering infection; reviewed CXR again from 11/25- do not feel needs to be repeated today; encouraged to use Flonase to help with post-nasal drainage; sample of BREO 200 mg qd x 14 days; refill on Tessalon [place]; if symptoms do not improve by mid- next week, will need another CXR however.   No follow-ups on file.  No orders of the defined types were placed in this encounter.   Requested Prescriptions   Signed Prescriptions Disp Refills  . fluticasone (FLONASE) 50 MCG/ACT nasal  spray 16 g 6    Sig: Place 2 sprays into both nostrils daily.  . fluticasone furoate-vilanterol (BREO ELLIPTA) 200-25 MCG/INH AEPB      Sig: Inhale 1 puff into the lungs daily.  . benzonatate (TESSALON) 100 MG capsule 30 capsule 0    Sig: Take 1 capsule (100 mg total) by mouth 3 (three) times daily as needed.

## 2018-02-25 NOTE — Progress Notes (Addendum)
[facility]  PATIENT: Vicki Garcia DOB: 10/19/1935   REASON FOR VISIT: follow up for dementia HISTORY FROM:patient and husband    HISTORY OF PRESENT ILLNESS:  Vicki Garcia is a 83 year old right-handed woman with an underlying medical history of hypertension, hearing impairment with hearing aids, tinnitus, seasonal allergies, hyperlipidemia, history of melanoma, history of esophagitis and reflux disease, history of low back pain with evidence of lumbar spinal stenosis, and history of diverticulosis, who presents for follow-up consultation of her memory loss, after a recent neuropsychological evaluation. The patient is accompanied by her husband again today. I last saw her on 05/19/2017, at which time we talked about the importance of nutrition. She had lost weight and had loss of appetite. She had recently been hospitalized for syncopal events. She was found to be dehydrated and improved after IV hydration. She also had acute kidney injury at the time.   Today, 08/19/2017: She reports doing fine. Her husband feels she is stable, she tries to drink ensure once or twice a day. Her weight has stabilized.  She had interim neuropsychological evaluation and testing with Dr. Bonita Quin in June 2019 with a feedback appointment on 07/19/2017 and I reviewed the results:   <<  Clinical Impressions:Moderate dementia, with behavioral disturbance, most likely secondary to Alzheimer's disease. Results of cognitive testing were abnormal with global cognitive dysfunction, and there is evidence that her cognitive deficits are interfering with her ability to manage complex tasks. As such, diagnostic criteria for a dementia syndrome are met. The level of dementia is moderate stage, making it more difficult to clearly differentiate etiology (as cognitive profile is less telling at this point); however, given course of symptoms (gradual onset and decline) and neuroimaging findings (preferential  medial temporal lobe atrophy), Alzheimer's disease is suspected. There is behavioral disturbance with sundowning. UPDATE 2/10/2020CM Vicki Garcia, 83 year old female returns for follow-up with dementia.  Neuropsych testing confirms diagnostic criteria for a dementia.  Her memory score is stable today 13 out of 30 same as 6 months ago.  She continues to live at home.  Her husband continues to work but is retiring soon.  Her weight has remained the same.  She occasionally will fix something in the microwave but otherwise does not cook.  No difficulty sleeping at night.  No recent falls.  Husband says her condition is stable.  She returns for reevaluation    REVIEW OF SYSTEMS: Full 14 system review of systems performed and notable only for those listed, all others are neg:  Constitutional: neg  Cardiovascular: neg Ear/Nose/Throat: neg  Skin: neg Eyes: neg Respiratory: neg Gastroitestinal: neg  Hematology/Lymphatic: neg  Endocrine: neg Musculoskeletal:neg Allergy/Immunology: neg Neurological: Memory loss Psychiatric: neg Sleep : neg   ALLERGIES: Allergies  Allergen Reactions  . Codeine Other (See Comments)    REACTION: violently ill with N&V  . Namzaric [Memantine Hcl-Donepezil Hcl] Nausea And Vomiting    Pt reports illness. .  Josem Kaufmann [Amlodipine Besylate] Nausea And Vomiting  . Vasotec Other (See Comments)    cough    HOME MEDICATIONS: Outpatient Medications Prior to Visit  Medication Sig Dispense Refill  . benzonatate (TESSALON) 100 MG capsule Take 1 capsule (100 mg total) by mouth 3 (three) times daily as needed. 30 capsule 0  . citalopram (CELEXA) 10 MG tablet TAKE 1 TABLET ONCE DAILY. 90 tablet 1  . donepezil (ARICEPT) 5 MG tablet Take 1 tablet (5 mg total) by mouth at bedtime. 30 tablet 5  . doxycycline (VIBRA-TABS) 100 MG  tablet Take 1 tablet (100 mg total) by mouth 2 (two) times daily. 20 tablet 0  . feeding supplement, ENSURE ENLIVE, (ENSURE ENLIVE) LIQD Take 237 mLs by  mouth 2 (two) times daily between meals. 237 mL 12  . ferrous sulfate 325 (65 FE) MG tablet Take 1 tablet (325 mg total) by mouth 2 (two) times daily with a meal. (Patient taking differently: Take 325 mg by mouth daily with breakfast. ) 180 tablet 1  . fluticasone (FLONASE) 50 MCG/ACT nasal spray Place 2 sprays into both nostrils daily. 16 g 6  . fluticasone furoate-vilanterol (BREO ELLIPTA) 200-25 MCG/INH AEPB Inhale 1 puff into the lungs daily.    Marland Kitchen latanoprost (XALATAN) 0.005 % ophthalmic solution   98  . loratadine (CLARITIN) 10 MG tablet Take 10 mg by mouth daily as needed for allergies.    Marland Kitchen losartan-hydrochlorothiazide (HYZAAR) 100-12.5 MG tablet TAKE 1 TABLET ONCE DAILY. 90 tablet 1  . promethazine-dextromethorphan (PROMETHAZINE-DM) 6.25-15 MG/5ML syrup TAKE 1 TEASPOONFUL (5ML) FOUR TIMES DAILY AS NEEDED FOR COUGH. 118 mL 0  . thiamine 100 MG tablet Take 1 tablet (100 mg total) by mouth daily. 30 tablet 0  . TRAVEL SICKNESS 25 MG CHEW TAKE  (1)  TABLET  THREE TIMES DAILY AS NEEDED FOR DIZZINESS. 30 tablet 0  . donepezil (ARICEPT) 5 MG tablet TAKE ONE TABLET AT BEDTIME. (Patient not taking: Reported on 02/28/2018) 90 tablet 0   No facility-administered medications prior to visit.     PAST MEDICAL HISTORY: Past Medical History:  Diagnosis Date  . Disorder of left rotator cuff   . Diverticulosis of colon    AVM @ colonoscopy 07/2008  . GERD (gastroesophageal reflux disease)   . History of adenomatous polyp of colon    2010  . History of esophageal dilatation    07/ 2014  . History of esophagitis    07-/ 2014  . History of melanoma excision    1987  right upper arm  . Hyperlipidemia   . Hypertension   . Prolapsed internal hemorrhoids, grade 4   . Seasonal and perennial allergic rhinitis   . Tinnitus    chronic  . Wears glasses   . Wears hearing aid    bilateral    PAST SURGICAL HISTORY: Past Surgical History:  Procedure Laterality Date  . COLONOSCOPY  last one 07-28-2012   . HEMORRHOID SURGERY N/A 04/18/2015   Procedure: HEMORRHOIDECTOMY;  Surgeon: Leighton Ruff, MD;  Location: [facility];  Service: General;  Laterality: N/A;  . LUMBAR FUSION  11-19-2006   L4 - L5  . UPPER GASTROINTESTINAL ENDOSCOPY  07-28-2012    FAMILY HISTORY: Family History  Problem Relation Age of Onset  . Skin cancer Brother        also spinal stenosis  . Kidney cancer Brother   . Stomach cancer Brother   . Lung cancer Brother        liver mets  . Coronary artery disease Father   . Hypertension Brother   . Prostate cancer Brother   . Breast cancer Paternal Aunt   . Heart attack Paternal Uncle 25  . Lymphoma Mother        NHL  . Hemolytic uremic syndrome Other        granddaughter  . Colon cancer Maternal Uncle   . Ulcers Daughter   . Stroke Neg Hx     SOCIAL HISTORY: Social History   Socioeconomic History  . Marital status: Married    Spouse name:  Not on file  . Number of children: Not on file  . Years of education: Not on file  . Highest education level: Not on file  Occupational History  . Not on file  Social Needs  . Financial resource strain: Not on file  . Food insecurity:    Worry: Not on file    Inability: Not on file  . Transportation needs:    Medical: Not on file    Non-medical: Not on file  Tobacco Use  . Smoking status: Former Smoker    Years: 10.00    Types: Cigarettes    Last attempt to quit: 01/19/1969    Years since quitting: 49.1  . Smokeless tobacco: Never Used  Substance and Sexual Activity  . Alcohol use: Yes    Alcohol/week: 5.0 standard drinks    Types: 5 Glasses of wine per week    Comment: occasional  . Drug use: No  . Sexual activity: Not on file    Comment: G1  P1  M1  Lifestyle  . Physical activity:    Days per week: Not on file    Minutes per session: Not on file  . Stress: Not on file  Relationships  . Social connections:    Talks on phone: Not on file    Gets together: Not on file    Attends  religious service: Not on file    Active member of club or organization: Not on file    Attends meetings of clubs or organizations: Not on file    Relationship status: Not on file  . Intimate partner violence:    Fear of current or ex partner: Not on file    Emotionally abused: Not on file    Physically abused: Not on file    Forced sexual activity: Not on file  Other Topics Concern  . Not on file  Social History Narrative   Regular exercise: Strength Training & walking w/o symptoms           PHYSICAL EXAM  Vitals:   02/28/18 1239  BP: 117/68  Pulse: 62  Weight: 98 lb 3.2 oz (44.5 kg)  Height: 5' 2"  (1.575 m)   Body mass index is 17.96 kg/m.  Generalized: Well developed, in no acute distress , well-groomed Head: normocephalic and atraumatic,. Oropharynx benign  Neck: Supple,  Musculoskeletal: No deformity   Neurological examination   Mentation: Alert  Follows all commands speech and language fluent.  On1/31/2019: MMSE: 15/30, CDT: 1/4, AFT: 3/min.  On 08/19/2017: MMSE: 13/30, CDT: 1/4, AFT: 1/min. ON 02/28/2018 MMSE13/30. CDT 1/4, AFT 1 Cranial nerve II-XII: Pupils were equal round reactive to light extraocular movements were full, visual field were full on confrontational test. Facial sensation and strength were normal. hearing was intact to finger rubbing bilaterally. Uvula tongue midline. head turning and shoulder shrug were normal and symmetric.Tongue protrusion into cheek strength was normal. Motor: normal bulk and tone, full strength in the BUE, BLE,  Sensory: normal and symmetric to light touch, pinprick, and  Vibration, in the upper and lower extremities Coordination: finger-nose-finger,  no dysmetria Reflexes: Symmetric upper and lower, plantar responses were flexor bilaterally. Gait and Station: Rising up from seated position without assistance, wide based  stance,  moderate stride, no difficulty with turns no assistive device DIAGNOSTIC DATA (LABS, IMAGING,  TESTING) - I reviewed patient records, labs, notes, testing and imaging myself where available.  Lab Results  Component Value Date   WBC 4.8 12/13/2017   HGB 11.8 (L)  12/13/2017   HCT 34.7 (L) 12/13/2017   MCV 91.1 12/13/2017   PLT 176.0 12/13/2017      Component Value Date/Time   NA 138 12/13/2017 1130   K 3.8 12/13/2017 1130   CL 98 12/13/2017 1130   CO2 32 12/13/2017 1130   GLUCOSE 98 12/13/2017 1130   GLUCOSE 98 12/15/2005 0859   BUN 15 12/13/2017 1130   CREATININE 0.81 12/13/2017 1130   CALCIUM 9.5 12/13/2017 1130   PROT 6.8 12/13/2017 1130   ALBUMIN 4.0 12/13/2017 1130   AST 20 12/13/2017 1130   ALT 18 12/13/2017 1130   ALKPHOS 50 12/13/2017 1130   BILITOT 0.4 12/13/2017 1130   GFRNONAA >60 04/28/2017 0423   GFRAA >60 04/28/2017 0423   Lab Results  Component Value Date   CHOL 206 (H) 03/17/2017   HDL 89.10 03/17/2017   LDLCALC 102 (H) 03/17/2017   TRIG 76.0 03/17/2017   CHOLHDL 2 03/17/2017   Lab Results  Component Value Date   HGBA1C 5.4 04/27/2017   Lab Results  Component Value Date   VITAMINB12 412 04/26/2017   Lab Results  Component Value Date   TSH 1.726 04/27/2017      ASSESSMENT AND PLAN Cruz Devilla a very pleasant72 year oldfemalewith an underlying medical history of hypertension, hearing impairment with no hearing aids (lost them), tinnitus,  hyperlipidemia,  whopresents forfollow-upconsultation of her dementia with evidence of mood related and behavioral changes. She has a history of memory loss for at least 2.5 years.  Recent neuropsychological evaluation confirms findings in keeping with moderate dementia in the realm of Alzheimer's disease with behavioral changes. Weight has stabilized. She does supplement with Ensure or Boost, 1-2 per day on average. We talked about the importance of good hydration and nutrition. . Of note, her brain MRI from February 2019 showed moderate atrophy. PLAN:  Memory score is stable Continue Aricept  at current dose will refill Follow-up in 6 months Stay well-hydrated to prevent dehydration Reviewed safety measures Dennie Bible, [facility], [facility], APRN  [facility] [address], [place], [place] [address] [phone]  I reviewed the above note and documentation by the Nurse Practitioner and agree with the history, physical exam, assessment and plan as outlined above. I was immediately available for face-to-face consultation. Star Age, MD, PhD [facility])

## 2018-02-26 ENCOUNTER — Other Ambulatory Visit: Payer: Self-pay | Admitting: Neurology

## 2018-02-28 ENCOUNTER — Encounter

## 2018-02-28 ENCOUNTER — Ambulatory Visit: Payer: Medicare Other | Admitting: Nurse Practitioner

## 2018-02-28 ENCOUNTER — Encounter: Payer: Self-pay | Admitting: Nurse Practitioner

## 2018-02-28 ENCOUNTER — Other Ambulatory Visit: Payer: Self-pay | Admitting: Neurology

## 2018-02-28 VITALS — BP 117/68 | HR 62 | Ht 62.0 in | Wt 98.2 lb

## 2018-02-28 DIAGNOSIS — F0391 Unspecified dementia with behavioral disturbance: Secondary | ICD-10-CM | POA: Diagnosis not present

## 2018-02-28 DIAGNOSIS — F03918 Unspecified dementia, unspecified severity, with other behavioral disturbance: Secondary | ICD-10-CM

## 2018-02-28 DIAGNOSIS — F03B18 Unspecified dementia, moderate, with other behavioral disturbance: Secondary | ICD-10-CM

## 2018-02-28 MED ORDER — DONEPEZIL HCL 5 MG PO TABS: 5.0000 mg | ORAL_TABLET | Freq: Every day | ORAL | 5 refills | Status: DC

## 2018-02-28 NOTE — Patient Instructions (Signed)
Memory score is stable Continue Aricept at current dose will refill Follow-up in 6 months

## 2018-03-14 ENCOUNTER — Other Ambulatory Visit (INDEPENDENT_AMBULATORY_CARE_PROVIDER_SITE_OTHER): Payer: Medicare Other

## 2018-03-14 ENCOUNTER — Encounter: Payer: Self-pay | Admitting: Internal Medicine

## 2018-03-14 ENCOUNTER — Ambulatory Visit (INDEPENDENT_AMBULATORY_CARE_PROVIDER_SITE_OTHER): Payer: Medicare Other | Admitting: Internal Medicine

## 2018-03-14 VITALS — BP 118/58 | HR 72 | Temp 98.3°F | Ht 62.0 in | Wt 99.0 lb

## 2018-03-14 DIAGNOSIS — F0391 Unspecified dementia with behavioral disturbance: Secondary | ICD-10-CM

## 2018-03-14 DIAGNOSIS — Z23 Encounter for immunization: Secondary | ICD-10-CM

## 2018-03-14 DIAGNOSIS — E785 Hyperlipidemia, unspecified: Secondary | ICD-10-CM

## 2018-03-14 DIAGNOSIS — D508 Other iron deficiency anemias: Secondary | ICD-10-CM

## 2018-03-14 DIAGNOSIS — Z0000 Encounter for general adult medical examination without abnormal findings: Secondary | ICD-10-CM

## 2018-03-14 DIAGNOSIS — I10 Essential (primary) hypertension: Secondary | ICD-10-CM | POA: Diagnosis not present

## 2018-03-14 DIAGNOSIS — F03918 Unspecified dementia, unspecified severity, with other behavioral disturbance: Secondary | ICD-10-CM

## 2018-03-14 LAB — COMPREHENSIVE METABOLIC PANEL
ALT: 16 U/L (ref 0–35)
AST: 18 U/L (ref 0–37)
Albumin: 4.2 g/dL (ref 3.5–5.2)
Alkaline Phosphatase: 58 U/L (ref 39–117)
BUN: 17 mg/dL (ref 6–23)
CO2: 33 mEq/L — ABNORMAL HIGH (ref 19–32)
Calcium: 9.6 mg/dL (ref 8.4–10.5)
Chloride: 99 mEq/L (ref 96–112)
Creatinine, Ser: 0.82 mg/dL (ref 0.40–1.20)
GFR: 66.9 mL/min (ref >60.00)
Glucose, Bld: 70 mg/dL (ref 70–99)
Potassium: 3.7 mEq/L (ref 3.5–5.1)
Sodium: 139 mEq/L (ref 135–145)
Total Bilirubin: 0.5 mg/dL (ref 0.2–1.2)
Total Protein: 6.7 g/dL (ref 6.0–8.3)

## 2018-03-14 LAB — CBC WITH DIFFERENTIAL/PLATELET
Basophils Absolute: 0 10*3/uL (ref 0.0–0.1)
Basophils Relative: 0.6 % (ref 0.0–3.0)
Eosinophils Absolute: 0.1 10*3/uL (ref 0.0–0.7)
Eosinophils Relative: 1.3 % (ref 0.0–5.0)
HCT: 38 % (ref 36.0–46.0)
HEMOGLOBIN: 12.9 g/dL (ref 12.0–15.0)
LYMPHS PCT: 21.7 % (ref 12.0–46.0)
Lymphs Abs: 1.1 10*3/uL (ref 0.7–4.0)
MCHC: 34.1 g/dL (ref 30.0–36.0)
MCV: 91 fl (ref 78.0–100.0)
Monocytes Absolute: 0.5 10*3/uL (ref 0.1–1.0)
Monocytes Relative: 10.4 % (ref 3.0–12.0)
Neutro Abs: 3.3 10*3/uL (ref 1.4–7.7)
Neutrophils Relative %: 66 % (ref 43.0–77.0)
Platelets: 247 10*3/uL (ref 150.0–400.0)
RBC: 4.17 Mil/uL (ref 3.87–5.11)
RDW: 13.1 % (ref 11.5–15.5)
WBC: 5 10*3/uL (ref 4.0–10.5)

## 2018-03-14 LAB — IBC PANEL
Iron: 153 ug/dL — ABNORMAL HIGH (ref 42–145)
Saturation Ratios: 46.3 % (ref 20.0–50.0)
Transferrin: 236 mg/dL (ref 212.0–360.0)

## 2018-03-14 LAB — LIPID PANEL
Cholesterol: 195 mg/dL (ref 0–200)
HDL: 75.9 mg/dL (ref >39.00)
LDL CALC: 104 mg/dL — AB (ref 0–99)
NonHDL: 118.82
Total CHOL/HDL Ratio: 3
Triglycerides: 76 mg/dL (ref 0.0–149.0)
VLDL: 15.2 mg/dL (ref 0.0–40.0)

## 2018-03-14 LAB — FERRITIN: Ferritin: 44.4 ng/mL (ref 10.0–291.0)

## 2018-03-14 NOTE — Assessment & Plan Note (Signed)

## 2018-03-14 NOTE — Patient Instructions (Signed)

## 2018-03-14 NOTE — Progress Notes (Signed)
Subjective:  Patient ID: Vicki Garcia, female    DOB: January 30, 1935  Age: 83 y.o. MRN: [id number]  CC: Annual Exam; Hypertension; Anemia; and Hyperlipidemia   HPI Vicki Garcia presents for a CPX.  She is with her husband today and he does most of the talking.  He is disappointed that the recent medicine added to treat dementia is not helping her much.  He tells me she continues to decline and he is thinking about placing her in a personal care home.  She offers no complaints today.  He tells me she is active and is eating and drinking normally.  Past Medical History:  Diagnosis Date  . Disorder of left rotator cuff   . Diverticulosis of colon    AVM @ colonoscopy 07/2008  . GERD (gastroesophageal reflux disease)   . History of adenomatous polyp of colon    2010  . History of esophageal dilatation    07/ 2014  . History of esophagitis    07-/ 2014  . History of melanoma excision    1987  right upper arm  . Hyperlipidemia   . Hypertension   . Prolapsed internal hemorrhoids, grade 4   . Seasonal and perennial allergic rhinitis   . Tinnitus    chronic  . Wears glasses   . Wears hearing aid    bilateral   Past Surgical History:  Procedure Laterality Date  . COLONOSCOPY  last one 07-28-2012  . HEMORRHOID SURGERY N/A 04/18/2015   Procedure: HEMORRHOIDECTOMY;  Surgeon: Leighton Ruff, MD;  Location: [facility];  Service: General;  Laterality: N/A;  . LUMBAR FUSION  11-19-2006   L4 - L5  . UPPER GASTROINTESTINAL ENDOSCOPY  07-28-2012    reports that she quit smoking about 49 years ago. Her smoking use included cigarettes. She quit after 10.00 years of use. She has never used smokeless tobacco. She reports current alcohol use of about 5.0 standard drinks of alcohol per week. She reports that she does not use drugs. family history includes Breast cancer in her paternal aunt; Colon cancer in her maternal uncle; Coronary artery disease in her father; Heart attack (age of  onset: 73) in her paternal uncle; Hemolytic uremic syndrome in an other family member; Hypertension in her brother; Kidney cancer in her brother; Lung cancer in her brother; Lymphoma in her mother; Prostate cancer in her brother; Skin cancer in her brother; Stomach cancer in her brother; Ulcers in her daughter. Allergies  Allergen Reactions  . Codeine Other (See Comments)    REACTION: violently ill with N&V  . Namzaric [Memantine Hcl-Donepezil Hcl] Nausea And Vomiting    Pt reports illness. .  Josem Kaufmann [Amlodipine Besylate] Nausea And Vomiting  . Vasotec Other (See Comments)    cough    Outpatient Medications Prior to Visit  Medication Sig Dispense Refill  . citalopram (CELEXA) 10 MG tablet TAKE 1 TABLET ONCE DAILY. 90 tablet 1  . donepezil (ARICEPT) 5 MG tablet Take 1 tablet (5 mg total) by mouth at bedtime. 30 tablet 5  . feeding supplement, ENSURE ENLIVE, (ENSURE ENLIVE) LIQD Take 237 mLs by mouth 2 (two) times daily between meals. 237 mL 12  . ferrous sulfate 325 (65 FE) MG tablet Take 1 tablet (325 mg total) by mouth 2 (two) times daily with a meal. (Patient taking differently: Take 325 mg by mouth daily with breakfast. ) 180 tablet 1  . losartan-hydrochlorothiazide (HYZAAR) 100-12.5 MG tablet TAKE 1 TABLET ONCE DAILY. 90 tablet 1  .  thiamine 100 MG tablet Take 1 tablet (100 mg total) by mouth daily. 30 tablet 0  . benzonatate (TESSALON) 100 MG capsule Take 1 capsule (100 mg total) by mouth 3 (three) times daily as needed. 30 capsule 0  . doxycycline (VIBRA-TABS) 100 MG tablet Take 1 tablet (100 mg total) by mouth 2 (two) times daily. 20 tablet 0  . fluticasone (FLONASE) 50 MCG/ACT nasal spray Place 2 sprays into both nostrils daily. 16 g 6  . fluticasone furoate-vilanterol (BREO ELLIPTA) 200-25 MCG/INH AEPB Inhale 1 puff into the lungs daily.    Marland Kitchen latanoprost (XALATAN) 0.005 % ophthalmic solution   98  . loratadine (CLARITIN) 10 MG tablet Take 10 mg by mouth daily as needed for  allergies.    . promethazine-dextromethorphan (PROMETHAZINE-DM) 6.25-15 MG/5ML syrup TAKE 1 TEASPOONFUL (5ML) FOUR TIMES DAILY AS NEEDED FOR COUGH. 118 mL 0  . TRAVEL SICKNESS 25 MG CHEW TAKE  (1)  TABLET  THREE TIMES DAILY AS NEEDED FOR DIZZINESS. 30 tablet 0   No facility-administered medications prior to visit.     ROS Review of Systems  Constitutional: Negative.  Negative for appetite change and fatigue.  HENT: Negative.   Eyes: Negative for visual disturbance.  Respiratory: Negative for cough and shortness of breath.   Cardiovascular: Negative.  Negative for chest pain, palpitations and leg swelling.  Gastrointestinal: Negative for abdominal pain, constipation, diarrhea, nausea and vomiting.  Genitourinary: Negative.  Negative for difficulty urinating.  Musculoskeletal: Negative.  Negative for arthralgias, joint swelling and myalgias.  Skin: Negative.  Negative for color change.  Neurological: Negative.  Negative for dizziness, weakness, light-headedness and headaches.  Hematological: Negative for adenopathy. Does not bruise/bleed easily.  Psychiatric/Behavioral: Negative.     Objective:  BP (!) 118/58 (BP Location: Left Arm, Patient Position: Sitting, Cuff Size: Normal)   Pulse 72   Temp 98.3 F (36.8 C) (Oral)   Ht 5\' 2"  (1.575 m)   Wt 99 lb (44.9 kg)   SpO2 92%   BMI 18.11 kg/m   BP Readings from Last 3 Encounters:  03/14/18 (!) 118/58  02/28/18 117/68  12/24/17 128/62    Wt Readings from Last 3 Encounters:  03/14/18 99 lb (44.9 kg)  02/28/18 98 lb 3.2 oz (44.5 kg)  12/24/17 98 lb 1.9 oz (44.5 kg)    Physical Exam Constitutional:      Appearance: She is not ill-appearing or diaphoretic.  HENT:     Nose: Nose normal. No congestion or rhinorrhea.     Mouth/Throat:     Mouth: Mucous membranes are moist.     Pharynx: Oropharynx is clear. No oropharyngeal exudate or posterior oropharyngeal erythema.  Eyes:     General: No scleral icterus.     Conjunctiva/sclera: Conjunctivae normal.  Neck:     Musculoskeletal: Normal range of motion and neck supple. No neck rigidity.  Cardiovascular:     Rate and Rhythm: Normal rate and regular rhythm.     Heart sounds: No murmur. No gallop.   Pulmonary:     Effort: Pulmonary effort is normal. No respiratory distress.     Breath sounds: No wheezing, rhonchi or rales.  Abdominal:     General: Abdomen is flat. Bowel sounds are normal. There is no distension.     Palpations: There is no mass.     Tenderness: There is no abdominal tenderness. There is no guarding.  Musculoskeletal: Normal range of motion.        General: No swelling.  Right lower leg: No edema.     Left lower leg: No edema.  Lymphadenopathy:     Cervical: No cervical adenopathy.  Skin:    General: Skin is warm and dry.  Neurological:     General: No focal deficit present.     Mental Status: Mental status is at baseline. She is disoriented.  Psychiatric:        Attention and Perception: Perception normal. She is inattentive.        Mood and Affect: Mood normal.        Speech: Speech is tangential.        Behavior: Behavior is slowed.        Thought Content: Thought content is not paranoid or delusional. Thought content does not include homicidal or suicidal ideation.        Cognition and Memory: Cognition is not impaired. Memory is not impaired. She does not exhibit impaired recent memory or impaired remote memory.     Lab Results  Component Value Date   WBC 5.0 03/14/2018   HGB 12.9 03/14/2018   HCT 38.0 03/14/2018   PLT 247.0 03/14/2018   GLUCOSE 70 03/14/2018   CHOL 195 03/14/2018   TRIG 76.0 03/14/2018   HDL 75.90 03/14/2018   LDLCALC 104 (H) 03/14/2018   ALT 16 03/14/2018   AST 18 03/14/2018   NA 139 03/14/2018   K 3.7 03/14/2018   CL 99 03/14/2018   CREATININE 0.82 03/14/2018   BUN 17 03/14/2018   CO2 33 (H) 03/14/2018   TSH 1.726 04/27/2017   HGBA1C 5.4 04/27/2017    Dg Chest 2 View  Result  Date: 12/13/2017 CLINICAL DATA:  Cough, congestion, and wheezing for the past 10 days. EXAM: CHEST - 2 VIEW COMPARISON:  Chest x-ray dated April 26, 2017. FINDINGS: The heart size and mediastinal contours are within normal limits. Normal pulmonary vascularity. The lungs remain hyperinflated with coarsened interstitial markings. No focal consolidation, pleural effusion, or pneumothorax. No acute osseous abnormality. IMPRESSION: COPD.  No active cardiopulmonary disease. Electronically Signed   By: Titus Dubin M.D.   On: 12/13/2017 11:43    Assessment & Plan:   Peytin was seen today for annual exam, hypertension, anemia and hyperlipidemia.  Diagnoses and all orders for this visit:  Essential hypertension- Her blood pressure is adequately well controlled.  Electrolytes and renal function are normal.  Will continue the current combination of an ARB and thiazide diuretic. -     Comprehensive metabolic panel; Future  Iron deficiency anemia due to dietary causes- her H&H and iron levels are normal now.  Will continue the current iron supplement. -     CBC with Differential/Platelet; Future -     IBC panel; Future -     Ferritin; Future  Hyperlipidemia LDL goal <160- She is over the age of 68 so statin therapy is not indicated. -     Lipid panel; Future -     Comprehensive metabolic panel; Future  Senile dementia with behavioral disturbance ([place])- I will monitor her B1 level.  She will continue to follow-up with neurology. -     Vitamin B1; Future  Need for pneumococcal vaccination -     Pneumococcal polysaccharide vaccine 23-valent greater than or equal to 2yo subcutaneous/IM   I have discontinued Hassan Rowan Harnois's loratadine, latanoprost, TRAVEL SICKNESS, doxycycline, promethazine-dextromethorphan, fluticasone, fluticasone furoate-vilanterol, and benzonatate. I am also having her maintain her feeding supplement (ENSURE ENLIVE), thiamine, ferrous sulfate, citalopram,  losartan-hydrochlorothiazide, and donepezil.  No orders  of the defined types were placed in this encounter.  See AVS for instructions about healthy living and anticipatory guidance.  Follow-up: Return in about 6 months (around 09/12/2018).  Scarlette Calico, MD

## 2018-03-17 LAB — VITAMIN B1: Vitamin B1 (Thiamine): 35 nmol/L — ABNORMAL HIGH (ref 8–30)

## 2018-03-30 DIAGNOSIS — H401111 Primary open-angle glaucoma, right eye, mild stage: Secondary | ICD-10-CM | POA: Diagnosis not present

## 2018-03-30 DIAGNOSIS — H40032 Anatomical narrow angle, left eye: Secondary | ICD-10-CM | POA: Diagnosis not present

## 2018-04-11 ENCOUNTER — Other Ambulatory Visit: Payer: Self-pay | Admitting: Internal Medicine

## 2018-04-30 ENCOUNTER — Other Ambulatory Visit: Payer: Self-pay | Admitting: Internal Medicine

## 2018-04-30 DIAGNOSIS — F0391 Unspecified dementia with behavioral disturbance: Secondary | ICD-10-CM

## 2018-04-30 DIAGNOSIS — F03918 Unspecified dementia, unspecified severity, with other behavioral disturbance: Secondary | ICD-10-CM

## 2018-05-16 ENCOUNTER — Other Ambulatory Visit: Payer: Self-pay

## 2018-05-16 ENCOUNTER — Ambulatory Visit (INDEPENDENT_AMBULATORY_CARE_PROVIDER_SITE_OTHER): Payer: Medicare Other

## 2018-05-16 ENCOUNTER — Encounter: Payer: Self-pay | Admitting: Podiatry

## 2018-05-16 ENCOUNTER — Ambulatory Visit (INDEPENDENT_AMBULATORY_CARE_PROVIDER_SITE_OTHER): Payer: Medicare Other | Admitting: Podiatry

## 2018-05-16 VITALS — Temp 97.6°F

## 2018-05-16 DIAGNOSIS — M21612 Bunion of left foot: Secondary | ICD-10-CM

## 2018-05-16 DIAGNOSIS — Z9889 Other specified postprocedural states: Secondary | ICD-10-CM

## 2018-05-16 DIAGNOSIS — L989 Disorder of the skin and subcutaneous tissue, unspecified: Secondary | ICD-10-CM | POA: Diagnosis not present

## 2018-05-16 DIAGNOSIS — M79672 Pain in left foot: Secondary | ICD-10-CM | POA: Diagnosis not present

## 2018-05-16 NOTE — Progress Notes (Signed)
   Subjective: Patient presents to the office today for chief complaint of painful callus lesions of the feet. Patient states that the pain is ongoing and is affecting their ability to ambulate without pain. Patient presents today for further treatment and evaluation.  Past Medical History:  Diagnosis Date  . Disorder of left rotator cuff   . Diverticulosis of colon    AVM @ colonoscopy 07/2008  . GERD (gastroesophageal reflux disease)   . History of adenomatous polyp of colon    2010  . History of esophageal dilatation    07/ 2014  . History of esophagitis    07-/ 2014  . History of melanoma excision    1987  right upper arm  . Hyperlipidemia   . Hypertension   . Prolapsed internal hemorrhoids, grade 4   . Seasonal and perennial allergic rhinitis   . Tinnitus    chronic  . Wears glasses   . Wears hearing aid    bilateral     Objective:  Physical Exam General: Alert and oriented x3 in no acute distress  Dermatology: Hyperkeratotic lesion present on the plantar aspect of the left forefoot. Pain on palpation with a central nucleated core noted. Skin is warm, dry and supple bilateral lower extremities. Negative for open lesions or macerations.  Vascular: Palpable pedal pulses bilaterally. No edema or erythema noted. Capillary refill within normal limits.  Neurological: Epicritic and protective threshold grossly intact bilaterally.   Musculoskeletal Exam: Pain on palpation at the keratotic lesion noted. Range of motion within normal limits bilateral. Muscle strength 5/5 in all groups bilateral.  Radiographic exam: Medial deviation of the head of the first metatarsal with complete healing status post bunionectomy.  Hardware intact.  Assessment: 1.  Porokeratosis left foot 2.  Pain in left foot 3.  Status post left foot bunionectomy DOS: 01/28/2017.   Plan of Care:  1. Patient evaluated 2. Excisional debridement of keratoic lesion using a chisel blade was performed without  incident.  3. Dressed area with salicylic acid and light dressing. 4. Patient is to return to the clinic PRN.   Edrick Kins, DPM [facility]  Dr. Edrick Kins, [address]                                        [place], [place] [address]                Office [phone]  Fax [phone]

## 2018-05-26 DIAGNOSIS — H40032 Anatomical narrow angle, left eye: Secondary | ICD-10-CM | POA: Diagnosis not present

## 2018-05-26 DIAGNOSIS — H4020X0 Unspecified primary angle-closure glaucoma, stage unspecified: Secondary | ICD-10-CM | POA: Diagnosis not present

## 2018-07-04 ENCOUNTER — Telehealth: Payer: Self-pay | Admitting: Internal Medicine

## 2018-07-04 DIAGNOSIS — Z20822 Contact with and (suspected) exposure to covid-19: Secondary | ICD-10-CM

## 2018-07-04 NOTE — Telephone Encounter (Signed)
Please enter order for COVID screening today for patient to enter into facility.

## 2018-07-04 NOTE — Telephone Encounter (Signed)
PCP is requesting to have pt tested for COVID

## 2018-07-04 NOTE — Telephone Encounter (Signed)
Orders placed and patient scheduled / Patient's daughter requested testing be scheduled on Wednesday 07-06-2018 /

## 2018-07-06 ENCOUNTER — Other Ambulatory Visit: Payer: Self-pay

## 2018-07-06 ENCOUNTER — Ambulatory Visit (INDEPENDENT_AMBULATORY_CARE_PROVIDER_SITE_OTHER): Payer: Medicare Other | Admitting: Internal Medicine

## 2018-07-06 ENCOUNTER — Encounter: Payer: Self-pay | Admitting: Internal Medicine

## 2018-07-06 VITALS — BP 142/88 | HR 60 | Temp 98.0°F | Resp 16 | Ht 62.0 in | Wt 99.0 lb

## 2018-07-06 DIAGNOSIS — I10 Essential (primary) hypertension: Secondary | ICD-10-CM | POA: Diagnosis not present

## 2018-07-06 DIAGNOSIS — R6889 Other general symptoms and signs: Secondary | ICD-10-CM | POA: Diagnosis not present

## 2018-07-06 DIAGNOSIS — Z20822 Contact with and (suspected) exposure to covid-19: Secondary | ICD-10-CM

## 2018-07-06 NOTE — Progress Notes (Signed)
Subjective:  Patient ID: Vicki Garcia, female    DOB: July 30, 1935  Age: 83 y.o. MRN: [id number]  CC: Hypertension   HPI Vicki Garcia presents for a BP check - She is with her husband today. She is at baseline. No recent concerns noted.  Outpatient Medications Prior to Visit  Medication Sig Dispense Refill  . citalopram (CELEXA) 10 MG tablet TAKE 1 TABLET ONCE DAILY. 90 tablet 1  . donepezil (ARICEPT) 5 MG tablet Take 1 tablet (5 mg total) by mouth at bedtime. 30 tablet 5  . feeding supplement, ENSURE ENLIVE, (ENSURE ENLIVE) LIQD Take 237 mLs by mouth 2 (two) times daily between meals. 237 mL 12  . ferrous sulfate 325 (65 FE) MG tablet Take 1 tablet (325 mg total) by mouth 2 (two) times daily with a meal. (Patient taking differently: Take 325 mg by mouth daily with breakfast. ) 180 tablet 1  . latanoprost (XALATAN) 0.005 % ophthalmic solution     . losartan-hydrochlorothiazide (HYZAAR) 100-12.5 MG tablet TAKE 1 TABLET ONCE DAILY. 90 tablet 1  . prednisoLONE acetate (PRED FORTE) 1 % ophthalmic suspension     . thiamine 100 MG tablet Take 1 tablet (100 mg total) by mouth daily. 30 tablet 0   No facility-administered medications prior to visit.     ROS Review of Systems  Constitutional: Negative for diaphoresis, fatigue and unexpected weight change.  HENT: Negative.   Eyes: Negative for visual disturbance.  Respiratory: Negative for cough, chest tightness, shortness of breath and wheezing.   Cardiovascular: Negative for chest pain, palpitations and leg swelling.  Gastrointestinal: Negative for abdominal pain, diarrhea, nausea and vomiting.  Endocrine: Negative.   Genitourinary: Negative.   Musculoskeletal: Negative.   Skin: Negative.  Negative for color change.  Neurological: Negative for dizziness, weakness, light-headedness and headaches.  Hematological: Negative for adenopathy. Does not bruise/bleed easily.  Psychiatric/Behavioral: Positive for confusion and decreased  concentration. Negative for dysphoric mood. The patient is not nervous/anxious.     Objective:  BP (!) 142/88 (BP Location: Left Arm, Patient Position: Sitting, Cuff Size: Normal)   Pulse 60   Temp 98 F (36.7 C) (Oral)   Resp 16   Ht 5\' 2"  (1.575 m)   Wt 99 lb (44.9 kg)   SpO2 99%   BMI 18.11 kg/m   BP Readings from Last 3 Encounters:  07/06/18 (!) 142/88  03/14/18 (!) 118/58  02/28/18 117/68    Wt Readings from Last 3 Encounters:  07/06/18 99 lb (44.9 kg)  03/14/18 99 lb (44.9 kg)  02/28/18 98 lb 3.2 oz (44.5 kg)    Physical Exam Vitals signs reviewed.  Constitutional:      Appearance: She is not ill-appearing or diaphoretic.  HENT:     Nose: No congestion or rhinorrhea.     Mouth/Throat:     Mouth: Mucous membranes are moist.     Pharynx: No oropharyngeal exudate.  Eyes:     General: No scleral icterus.    Conjunctiva/sclera: Conjunctivae normal.  Neck:     Musculoskeletal: Normal range of motion. No neck rigidity.  Cardiovascular:     Rate and Rhythm: Normal rate and regular rhythm.     Heart sounds: No murmur.  Pulmonary:     Effort: Pulmonary effort is normal.     Breath sounds: No stridor. No wheezing, rhonchi or rales.  Abdominal:     General: Abdomen is flat.     Palpations: There is no hepatomegaly, splenomegaly or mass.  Tenderness: There is no abdominal tenderness. There is no guarding.  Musculoskeletal: Normal range of motion.     Right lower leg: No edema.     Left lower leg: No edema.  Lymphadenopathy:     Cervical: No cervical adenopathy.  Skin:    General: Skin is warm.  Neurological:     General: No focal deficit present.     Mental Status: Mental status is at baseline.  Psychiatric:        Mood and Affect: Mood normal.        Behavior: Behavior normal.     Lab Results  Component Value Date   WBC 5.0 03/14/2018   HGB 12.9 03/14/2018   HCT 38.0 03/14/2018   PLT 247.0 03/14/2018   GLUCOSE 70 03/14/2018   CHOL 195 03/14/2018    TRIG 76.0 03/14/2018   HDL 75.90 03/14/2018   LDLCALC 104 (H) 03/14/2018   ALT 16 03/14/2018   AST 18 03/14/2018   NA 139 03/14/2018   K 3.7 03/14/2018   CL 99 03/14/2018   CREATININE 0.82 03/14/2018   BUN 17 03/14/2018   CO2 33 (H) 03/14/2018   TSH 1.726 04/27/2017   HGBA1C 5.4 04/27/2017    Dg Chest 2 View  Result Date: 12/13/2017 CLINICAL DATA:  Cough, congestion, and wheezing for the past 10 days. EXAM: CHEST - 2 VIEW COMPARISON:  Chest x-ray dated April 26, 2017. FINDINGS: The heart size and mediastinal contours are within normal limits. Normal pulmonary vascularity. The lungs remain hyperinflated with coarsened interstitial markings. No focal consolidation, pleural effusion, or pneumothorax. No acute osseous abnormality. IMPRESSION: COPD.  No active cardiopulmonary disease. Electronically Signed   By: Titus Dubin M.D.   On: 12/13/2017 11:43    Assessment & Plan:   Bergen was seen today for hypertension.  Diagnoses and all orders for this visit:  Essential hypertension- Her BP is well controlled. Will cont the current meds.   I have discontinued [name]'s thiamine. I am also having her maintain her feeding supplement (ENSURE ENLIVE), ferrous sulfate, donepezil, losartan-hydrochlorothiazide, citalopram, latanoprost, and prednisoLONE acetate.  No orders of the defined types were placed in this encounter.    Follow-up: No follow-ups on file.  Scarlette Calico, MD

## 2018-07-06 NOTE — Patient Instructions (Signed)

## 2018-07-07 ENCOUNTER — Telehealth: Payer: Self-pay | Admitting: Internal Medicine

## 2018-07-07 NOTE — Telephone Encounter (Signed)
Routing to dr Ronnald Ramp

## 2018-07-07 NOTE — Telephone Encounter (Signed)
Pt daughter called to request that when her mothers COVID-19 test is resulted to please send to [facility]. It is needed there for Admission.

## 2018-07-08 NOTE — Telephone Encounter (Signed)
Vicki Garcia (pt dtr?) lvm to call back.   I have the forms ready to be sent but I do not have the COVID results as of right now.

## 2018-07-08 NOTE — Telephone Encounter (Signed)
Patients daughter called back stating that they were hoping to get the patient moved into the facility this weekend.  I informed the [facility] to let the daughter know that the results have not came back yet.

## 2018-07-09 ENCOUNTER — Encounter: Payer: Self-pay | Admitting: Internal Medicine

## 2018-07-09 LAB — NOVEL CORONAVIRUS, NAA: SARS-CoV-2, NAA: NOT DETECTED

## 2018-07-09 NOTE — Telephone Encounter (Signed)
Her COVID test was negative  TJ

## 2018-07-11 NOTE — Telephone Encounter (Signed)
covid test results along with other paper work that needed to be faxed is being faxed to [facility] now

## 2018-07-11 NOTE — Telephone Encounter (Signed)
Daughter advised that all paperwork has been faxed

## 2018-07-11 NOTE — Telephone Encounter (Signed)
Daughter is stating that the packet of informationfor Vicki Garcia, the home she is going to today needs to be faxed in with the negative COVID results. States that if it does not happen today that she will lose her spot. She says everything is complete as of the test it just needs to be faxed over to the home this am. Asked for fax # and states that [facility] already has it not her. Daughter is extremely anxious.

## 2018-08-06 ENCOUNTER — Emergency Department (HOSPITAL_COMMUNITY): Payer: Medicare Other

## 2018-08-06 ENCOUNTER — Inpatient Hospital Stay (HOSPITAL_COMMUNITY)
Admission: EM | Admit: 2018-08-06 | Discharge: 2018-08-19 | DRG: 178 | Disposition: A | Discharge status: Skilled Nursing Facility | Payer: Medicare Other | Attending: Family Medicine | Admitting: Family Medicine

## 2018-08-06 ENCOUNTER — Encounter (HOSPITAL_COMMUNITY): Payer: Self-pay | Admitting: Emergency Medicine

## 2018-08-06 ENCOUNTER — Other Ambulatory Visit: Payer: Self-pay

## 2018-08-06 DIAGNOSIS — Z981 Arthrodesis status: Secondary | ICD-10-CM

## 2018-08-06 DIAGNOSIS — Z808 Family history of malignant neoplasm of other organs or systems: Secondary | ICD-10-CM | POA: Diagnosis not present

## 2018-08-06 DIAGNOSIS — Z79890 Hormone replacement therapy (postmenopausal): Secondary | ICD-10-CM | POA: Diagnosis not present

## 2018-08-06 DIAGNOSIS — Z87891 Personal history of nicotine dependence: Secondary | ICD-10-CM | POA: Diagnosis not present

## 2018-08-06 DIAGNOSIS — Z8051 Family history of malignant neoplasm of kidney: Secondary | ICD-10-CM

## 2018-08-06 DIAGNOSIS — E785 Hyperlipidemia, unspecified: Secondary | ICD-10-CM | POA: Diagnosis present

## 2018-08-06 DIAGNOSIS — J069 Acute upper respiratory infection, unspecified: Secondary | ICD-10-CM | POA: Diagnosis not present

## 2018-08-06 DIAGNOSIS — R279 Unspecified lack of coordination: Secondary | ICD-10-CM | POA: Diagnosis not present

## 2018-08-06 DIAGNOSIS — R000 Tachycardia, unspecified: Secondary | ICD-10-CM | POA: Diagnosis not present

## 2018-08-06 DIAGNOSIS — B9789 Other viral agents as the cause of diseases classified elsewhere: Secondary | ICD-10-CM | POA: Diagnosis not present

## 2018-08-06 DIAGNOSIS — Z743 Need for continuous supervision: Secondary | ICD-10-CM | POA: Diagnosis not present

## 2018-08-06 DIAGNOSIS — E611 Iron deficiency: Secondary | ICD-10-CM | POA: Diagnosis present

## 2018-08-06 DIAGNOSIS — Z86010 Personal history of colonic polyps: Secondary | ICD-10-CM

## 2018-08-06 DIAGNOSIS — Z8042 Family history of malignant neoplasm of prostate: Secondary | ICD-10-CM

## 2018-08-06 DIAGNOSIS — E871 Hypo-osmolality and hyponatremia: Secondary | ICD-10-CM | POA: Diagnosis not present

## 2018-08-06 DIAGNOSIS — Z79899 Other long term (current) drug therapy: Secondary | ICD-10-CM

## 2018-08-06 DIAGNOSIS — Z888 Allergy status to other drugs, medicaments and biological substances status: Secondary | ICD-10-CM | POA: Diagnosis not present

## 2018-08-06 DIAGNOSIS — F0391 Unspecified dementia with behavioral disturbance: Secondary | ICD-10-CM | POA: Diagnosis not present

## 2018-08-06 DIAGNOSIS — Z974 Presence of external hearing-aid: Secondary | ICD-10-CM | POA: Diagnosis not present

## 2018-08-06 DIAGNOSIS — U071 COVID-19: Secondary | ICD-10-CM | POA: Diagnosis not present

## 2018-08-06 DIAGNOSIS — K219 Gastro-esophageal reflux disease without esophagitis: Secondary | ICD-10-CM | POA: Diagnosis not present

## 2018-08-06 DIAGNOSIS — Z85820 Personal history of malignant melanoma of skin: Secondary | ICD-10-CM

## 2018-08-06 DIAGNOSIS — I10 Essential (primary) hypertension: Secondary | ICD-10-CM | POA: Diagnosis not present

## 2018-08-06 DIAGNOSIS — Z885 Allergy status to narcotic agent status: Secondary | ICD-10-CM

## 2018-08-06 DIAGNOSIS — Z209 Contact with and (suspected) exposure to unspecified communicable disease: Secondary | ICD-10-CM | POA: Diagnosis not present

## 2018-08-06 DIAGNOSIS — Z801 Family history of malignant neoplasm of trachea, bronchus and lung: Secondary | ICD-10-CM | POA: Diagnosis not present

## 2018-08-06 DIAGNOSIS — D509 Iron deficiency anemia, unspecified: Secondary | ICD-10-CM | POA: Diagnosis present

## 2018-08-06 DIAGNOSIS — Z803 Family history of malignant neoplasm of breast: Secondary | ICD-10-CM | POA: Diagnosis not present

## 2018-08-06 DIAGNOSIS — Z800 Family history of malignant neoplasm of digestive organs: Secondary | ICD-10-CM

## 2018-08-06 DIAGNOSIS — F03918 Unspecified dementia, unspecified severity, with other behavioral disturbance: Secondary | ICD-10-CM | POA: Diagnosis present

## 2018-08-06 DIAGNOSIS — Z66 Do not resuscitate: Secondary | ICD-10-CM | POA: Diagnosis not present

## 2018-08-06 DIAGNOSIS — R456 Violent behavior: Secondary | ICD-10-CM | POA: Diagnosis not present

## 2018-08-06 DIAGNOSIS — R05 Cough: Secondary | ICD-10-CM | POA: Diagnosis not present

## 2018-08-06 DIAGNOSIS — Z781 Physical restraint status: Secondary | ICD-10-CM

## 2018-08-06 DIAGNOSIS — Z807 Family history of other malignant neoplasms of lymphoid, hematopoietic and related tissues: Secondary | ICD-10-CM | POA: Diagnosis not present

## 2018-08-06 DIAGNOSIS — Z8249 Family history of ischemic heart disease and other diseases of the circulatory system: Secondary | ICD-10-CM | POA: Diagnosis not present

## 2018-08-06 DIAGNOSIS — R52 Pain, unspecified: Secondary | ICD-10-CM | POA: Diagnosis not present

## 2018-08-06 DIAGNOSIS — B342 Coronavirus infection, unspecified: Secondary | ICD-10-CM | POA: Diagnosis present

## 2018-08-06 LAB — BASIC METABOLIC PANEL
Anion gap: 11 (ref 5–15)
BUN: 18 mg/dL (ref 8–23)
CO2: 30 mmol/L (ref 22–32)
Calcium: 9.6 mg/dL (ref 8.9–10.3)
Chloride: 95 mmol/L — ABNORMAL LOW (ref 98–111)
Creatinine, Ser: 0.85 mg/dL (ref 0.44–1.00)
GFR calc Af Amer: >60 mL/min (ref >60)
GFR calc non Af Amer: >60 mL/min (ref >60)
Glucose, Bld: 103 mg/dL — ABNORMAL HIGH (ref 70–99)
Potassium: 3.6 mmol/L (ref 3.5–5.1)
Sodium: 136 mmol/L (ref 135–145)

## 2018-08-06 LAB — CBC
HCT: 38.5 % (ref 36.0–46.0)
Hemoglobin: 12.4 g/dL (ref 12.0–15.0)
MCH: 30.5 pg (ref 26.0–34.0)
MCHC: 32.2 g/dL (ref 30.0–36.0)
MCV: 94.6 fL (ref 80.0–100.0)
Platelets: 245 10*3/uL (ref 150–400)
RBC: 4.07 MIL/uL (ref 3.87–5.11)
RDW: 12.1 % (ref 11.5–15.5)
WBC: 5.1 10*3/uL (ref 4.0–10.5)
nRBC: 0 % (ref 0.0–0.2)

## 2018-08-06 MED ORDER — LORAZEPAM 0.5 MG PO TABS
0.5000 mg | ORAL_TABLET | Freq: Once | ORAL | Status: AC
  Administered 2018-08-06: 0.5 mg via ORAL
  Filled 2018-08-06: qty 1

## 2018-08-06 NOTE — ED Provider Notes (Signed)
[facility] DEPT Provider Note   CSN: [id number] Arrival date & time: 08/06/18  2034     History   Chief Complaint Chief Complaint  Patient presents with  . Cough   Level 5 caveat: Dementia HPI Vicki Garcia is a 83 y.o. female.     HPI Patient is an 83 year old female is brought to the emergency department from a skilled nursing facility with reported cough over the past day.  There have been several positive coronavirus patients from their facility.  Facility states cough is persistent today.  No documented fever.  Patient has dementia and is unable to provide any reasonable history   Past Medical History:  Diagnosis Date  . Disorder of left rotator cuff   . Diverticulosis of colon    AVM @ colonoscopy 07/2008  . GERD (gastroesophageal reflux disease)   . History of adenomatous polyp of colon    2010  . History of esophageal dilatation    07/ 2014  . History of esophagitis    07-/ 2014  . History of melanoma excision    1987  right upper arm  . Hyperlipidemia   . Hypertension   . Prolapsed internal hemorrhoids, grade 4   . Seasonal and perennial allergic rhinitis   . Tinnitus    chronic  . Wears glasses   . Wears hearing aid    bilateral    Patient Active Problem List   Diagnosis Date Noted  . Routine general medical examination at a health care facility 03/14/2018  . Iron deficiency anemia due to dietary causes 05/05/2017  . Chronic cough 03/06/2016  . Senile dementia with behavioral disturbance ([place]) 10/24/2014  . Benign esophageal stricture 07/30/2014  . Seasonal and perennial allergic rhinitis 10/29/2008  . MELANOMA, UPPER ARM 04/04/2008  . Essential hypertension 02/22/2007  . Hyperlipidemia LDL goal <160 02/18/2007    Past Surgical History:  Procedure Laterality Date  . COLONOSCOPY  last one 07-28-2012  . HEMORRHOID SURGERY N/A 04/18/2015   Procedure: HEMORRHOIDECTOMY;  Surgeon: Leighton Ruff, MD;  Location[facility];  Service: General;  Laterality: N/A;  . LUMBAR FUSION  11-19-2006   L4 - L5  . UPPER GASTROINTESTINAL ENDOSCOPY  07-28-2012     OB History   No obstetric history on file.      Home Medications    Prior to Admission medications   Medication Sig Start Date End Date Taking? Authorizing Provider  citalopram (CELEXA) 10 MG tablet TAKE 1 TABLET ONCE DAILY. 05/01/18   Janith Lima, MD  donepezil (ARICEPT) 5 MG tablet Take 1 tablet (5 mg total) by mouth at bedtime. 02/28/18   Dennie Bible, NP  feeding supplement, ENSURE ENLIVE, (ENSURE ENLIVE) LIQD Take 237 mLs by mouth 2 (two) times daily between meals. 04/28/17   Aline August, MD  ferrous sulfate 325 (65 FE) MG tablet Take 1 tablet (325 mg total) by mouth 2 (two) times daily with a meal. Patient taking differently: Take 325 mg by mouth daily with breakfast.  05/05/17   Janith Lima, MD  latanoprost (XALATAN) 0.005 % ophthalmic solution  06/15/18   [provider]  losartan-hydrochlorothiazide (HYZAAR) 100-12.5 MG tablet TAKE 1 TABLET ONCE DAILY. 04/11/18   Janith Lima, MD  prednisoLONE acetate (PRED FORTE) 1 % ophthalmic suspension  03/31/18   [provider]    Family History Family History  Problem Relation Age of Onset  . Skin cancer Brother  also spinal stenosis  . Kidney cancer Brother   . Stomach cancer Brother   . Lung cancer Brother        liver mets  . Coronary artery disease Father   . Hypertension Brother   . Prostate cancer Brother   . Breast cancer Paternal Aunt   . Heart attack Paternal Uncle 60  . Lymphoma Mother        NHL  . Hemolytic uremic syndrome Other        granddaughter  . Colon cancer Maternal Uncle   . Ulcers Daughter   . Stroke Neg Hx     Social History Social History   Tobacco Use  . Smoking status: Former Smoker    Years: 10.00    Types: Cigarettes    Quit date: 01/19/1969    Years since quitting: 49.5  . Smokeless tobacco: Never  Used  Substance Use Topics  . Alcohol use: Yes    Alcohol/week: 5.0 standard drinks    Types: 5 Glasses of wine per week    Comment: occasional  . Drug use: No     Allergies   Codeine, Namzaric [memantine hcl-donepezil hcl], Norvasc [amlodipine besylate], and Vasotec   Review of Systems Review of Systems  Unable to perform ROS: Dementia     Physical Exam Updated Vital Signs BP (!) 153/69 (BP Location: Left Arm)   Pulse 75   Temp 99.3 F (37.4 C) (Oral)   Resp 15   SpO2 100%   Physical Exam Vitals signs and nursing note reviewed.  Constitutional:      General: She is not in acute distress.    Appearance: She is well-developed.  HENT:     Head: Normocephalic and atraumatic.  Neck:     Musculoskeletal: Normal range of motion.  Cardiovascular:     Rate and Rhythm: Normal rate and regular rhythm.     Heart sounds: Normal heart sounds.  Pulmonary:     Effort: Pulmonary effort is normal.     Breath sounds: Normal breath sounds.  Abdominal:     General: There is no distension.     Palpations: Abdomen is soft.     Tenderness: There is no abdominal tenderness.  Musculoskeletal: Normal range of motion.  Skin:    General: Skin is warm and dry.  Neurological:     General: No focal deficit present.     Mental Status: She is alert. Mental status is at baseline.  Psychiatric:        Judgment: Judgment normal.      ED Treatments / Results  Labs (all labs ordered are listed, but only abnormal results are displayed) Labs Reviewed  SARS CORONAVIRUS 2 (HOSPITAL ORDER, [place] [facility])  CBC  BASIC METABOLIC PANEL    EKG None  Radiology No results found.  Procedures Procedures (including critical care time)  Medications Ordered in ED Medications - No data to display   Initial Impression / Assessment and Plan / ED Course  I have reviewed the triage vital signs and the nursing notes.  Pertinent labs & imaging results that were  available during my care of the patient were reviewed by me and considered in my medical decision making (see chart for details).       Vital signs are stable.  Labs are reassuring.  Chest x-ray is clear.  COVID-19 test pending.  She will be able to be discharged back to the facility regardless of the results of her COVID-19 test  as she is not hypoxic or having increased work of breathing..  Testing for COVID-19 here in the emergency department will help the facility to appropriately place her away from noninfected residents at the facility  Final Clinical Impressions(s) / ED Diagnoses   Final diagnoses:  None    ED Discharge Orders    None       Jola Schmidt, MD 08/06/18 2339

## 2018-08-06 NOTE — ED Triage Notes (Signed)
Pt has hx of dementia

## 2018-08-06 NOTE — ED Triage Notes (Signed)
Per EMS - Pt coming from [facility] with complaints of cough x1 day. Facility stated they are concerned because multiple residence have tested +. Facility said cough is persistent, but EMS states that pt only coughed a couple of times while in their presence.  No other complaints or symptoms.    124 70 78 HR  98% RA  98.5 15 R

## 2018-08-07 ENCOUNTER — Other Ambulatory Visit: Payer: Self-pay

## 2018-08-07 DIAGNOSIS — F0391 Unspecified dementia with behavioral disturbance: Secondary | ICD-10-CM

## 2018-08-07 DIAGNOSIS — U071 COVID-19: Secondary | ICD-10-CM | POA: Diagnosis not present

## 2018-08-07 DIAGNOSIS — I10 Essential (primary) hypertension: Secondary | ICD-10-CM

## 2018-08-07 DIAGNOSIS — J069 Acute upper respiratory infection, unspecified: Secondary | ICD-10-CM | POA: Diagnosis present

## 2018-08-07 LAB — CBC WITH DIFFERENTIAL/PLATELET
Abs Immature Granulocytes: 0.01 10*3/uL (ref 0.00–0.07)
Basophils Absolute: 0 10*3/uL (ref 0.0–0.1)
Basophils Relative: 1 %
Eosinophils Absolute: 0.1 10*3/uL (ref 0.0–0.5)
Eosinophils Relative: 1 %
HCT: 34 % — ABNORMAL LOW (ref 36.0–46.0)
Hemoglobin: 11.4 g/dL — ABNORMAL LOW (ref 12.0–15.0)
Immature Granulocytes: 0 %
Lymphocytes Relative: 9 %
Lymphs Abs: 0.4 10*3/uL — ABNORMAL LOW (ref 0.7–4.0)
MCH: 31.1 pg (ref 26.0–34.0)
MCHC: 33.5 g/dL (ref 30.0–36.0)
MCV: 92.6 fL (ref 80.0–100.0)
Monocytes Absolute: 0.7 10*3/uL (ref 0.1–1.0)
Monocytes Relative: 17 %
Neutro Abs: 3.2 10*3/uL (ref 1.7–7.7)
Neutrophils Relative %: 72 %
Platelets: 210 10*3/uL (ref 150–400)
RBC: 3.67 MIL/uL — ABNORMAL LOW (ref 3.87–5.11)
RDW: 12.1 % (ref 11.5–15.5)
WBC: 4.4 10*3/uL (ref 4.0–10.5)
nRBC: 0 % (ref 0.0–0.2)

## 2018-08-07 LAB — COMPREHENSIVE METABOLIC PANEL
ALT: 19 U/L (ref 0–44)
AST: 21 U/L (ref 15–41)
Albumin: 4.1 g/dL (ref 3.5–5.0)
Alkaline Phosphatase: 62 U/L (ref 38–126)
Anion gap: 11 (ref 5–15)
BUN: 14 mg/dL (ref 8–23)
CO2: 26 mmol/L (ref 22–32)
Calcium: 9.2 mg/dL (ref 8.9–10.3)
Chloride: 96 mmol/L — ABNORMAL LOW (ref 98–111)
Creatinine, Ser: 0.64 mg/dL (ref 0.44–1.00)
GFR calc Af Amer: >60 mL/min (ref >60)
GFR calc non Af Amer: >60 mL/min (ref >60)
Glucose, Bld: 100 mg/dL — ABNORMAL HIGH (ref 70–99)
Potassium: 3.8 mmol/L (ref 3.5–5.1)
Sodium: 133 mmol/L — ABNORMAL LOW (ref 135–145)
Total Bilirubin: 0.8 mg/dL (ref 0.3–1.2)
Total Protein: 6.7 g/dL (ref 6.5–8.1)

## 2018-08-07 LAB — MRSA PCR SCREENING: MRSA by PCR: NEGATIVE

## 2018-08-07 LAB — TYPE AND SCREEN
ABO/RH(D): B NEG
Antibody Screen: NEGATIVE

## 2018-08-07 LAB — TROPONIN I (HIGH SENSITIVITY): Troponin I (High Sensitivity): 4 ng/L (ref <18)

## 2018-08-07 LAB — PROCALCITONIN: Procalcitonin: <0.1 ng/mL

## 2018-08-07 LAB — CK TOTAL AND CKMB (NOT AT ARMC)
CK, MB: 5 ng/mL (ref 0.5–5.0)
Relative Index: 3.4 — ABNORMAL HIGH (ref 0.0–2.5)
Total CK: 147 U/L (ref 38–234)

## 2018-08-07 LAB — ABO/RH: ABO/RH(D): B NEG

## 2018-08-07 LAB — LACTATE DEHYDROGENASE: LDH: 148 U/L (ref 98–192)

## 2018-08-07 LAB — FERRITIN: Ferritin: 51 ng/mL (ref 11–307)

## 2018-08-07 LAB — SARS CORONAVIRUS 2 BY RT PCR (HOSPITAL ORDER, PERFORMED IN ~~LOC~~ HOSPITAL LAB): SARS Coronavirus 2: POSITIVE — AB

## 2018-08-07 LAB — BRAIN NATRIURETIC PEPTIDE: B Natriuretic Peptide: 50.9 pg/mL (ref 0.0–100.0)

## 2018-08-07 LAB — SEDIMENTATION RATE: Sed Rate: 10 mm/hr (ref 0–22)

## 2018-08-07 MED ORDER — HYDROCHLOROTHIAZIDE 12.5 MG PO CAPS
12.5000 mg | ORAL_CAPSULE | Freq: Every day | ORAL | Status: DC
  Filled 2018-08-07 (×4): qty 1

## 2018-08-07 MED ORDER — VITAMIN C 500 MG PO TABS
500.0000 mg | ORAL_TABLET | Freq: Every day | ORAL | Status: DC
  Administered 2018-08-07 – 2018-08-18 (×11): 500 mg via ORAL
  Filled 2018-08-07 (×13): qty 1

## 2018-08-07 MED ORDER — SODIUM CHLORIDE 0.9 % IV SOLN: 250.0000 mL | INTRAVENOUS | Status: DC | PRN

## 2018-08-07 MED ORDER — ENOXAPARIN SODIUM 40 MG/0.4ML ~~LOC~~ SOLN
40.0000 mg | SUBCUTANEOUS | Status: DC
  Administered 2018-08-07 – 2018-08-09 (×3): 40 mg via SUBCUTANEOUS
  Filled 2018-08-07 (×4): qty 0.4

## 2018-08-07 MED ORDER — LOSARTAN POTASSIUM 25 MG PO TABS
100.0000 mg | ORAL_TABLET | Freq: Every day | ORAL | Status: DC
  Filled 2018-08-07 (×2): qty 2
  Filled 2018-08-07: qty 4

## 2018-08-07 MED ORDER — ZINC SULFATE 220 (50 ZN) MG PO CAPS
220.0000 mg | ORAL_CAPSULE | Freq: Every day | ORAL | Status: DC
  Administered 2018-08-07 – 2018-08-18 (×12): 220 mg via ORAL
  Filled 2018-08-07 (×13): qty 1

## 2018-08-07 MED ORDER — SODIUM CHLORIDE 0.9% FLUSH
3.0000 mL | Freq: Two times a day (BID) | INTRAVENOUS | Status: DC
  Administered 2018-08-07 – 2018-08-09 (×5): 3 mL via INTRAVENOUS

## 2018-08-07 MED ORDER — ACETAMINOPHEN 325 MG PO TABS: 650.0000 mg | ORAL_TABLET | ORAL | Status: DC | PRN

## 2018-08-07 MED ORDER — HALOPERIDOL LACTATE 5 MG/ML IJ SOLN
2.0000 mg | Freq: Once | INTRAMUSCULAR | Status: AC
  Administered 2018-08-07: 2 mg via INTRAVENOUS
  Filled 2018-08-07: qty 1

## 2018-08-07 MED ORDER — LOSARTAN POTASSIUM-HCTZ 100-12.5 MG PO TABS: 1.0000 | ORAL_TABLET | Freq: Every day | ORAL | Status: DC

## 2018-08-07 MED ORDER — ALBUTEROL SULFATE HFA 108 (90 BASE) MCG/ACT IN AERS
2.0000 | INHALATION_SPRAY | RESPIRATORY_TRACT | Status: DC | PRN
  Filled 2018-08-07: qty 6.7

## 2018-08-07 MED ORDER — LATANOPROST 0.005 % OP SOLN
1.0000 [drp] | Freq: Every day | OPHTHALMIC | Status: DC
  Administered 2018-08-07 – 2018-08-18 (×10): 1 [drp] via OPHTHALMIC
  Filled 2018-08-07 (×2): qty 2.5

## 2018-08-07 MED ORDER — SODIUM CHLORIDE 0.9% FLUSH: 3.0000 mL | INTRAVENOUS | Status: DC | PRN

## 2018-08-07 MED ORDER — FAMOTIDINE 20 MG PO TABS
40.0000 mg | ORAL_TABLET | Freq: Every day | ORAL | Status: DC
  Administered 2018-08-07 – 2018-08-18 (×12): 40 mg via ORAL
  Filled 2018-08-07 (×13): qty 2

## 2018-08-07 MED ORDER — HYDRALAZINE HCL 20 MG/ML IJ SOLN: 10.0000 mg | Freq: Four times a day (QID) | INTRAMUSCULAR | Status: DC | PRN

## 2018-08-07 MED ORDER — LORAZEPAM 2 MG/ML PO CONC
0.5000 mg | Freq: Three times a day (TID) | ORAL | Status: DC | PRN
  Administered 2018-08-07 – 2018-08-08 (×3): 0.5 mg via SUBLINGUAL
  Filled 2018-08-07 (×3): qty 1

## 2018-08-07 MED ORDER — DONEPEZIL HCL 10 MG PO TABS
5.0000 mg | ORAL_TABLET | Freq: Every day | ORAL | Status: DC
  Administered 2018-08-07 – 2018-08-18 (×11): 5 mg via ORAL
  Filled 2018-08-07 (×12): qty 1

## 2018-08-07 MED ORDER — BENZONATATE 100 MG PO CAPS
100.0000 mg | ORAL_CAPSULE | Freq: Three times a day (TID) | ORAL | Status: DC
  Administered 2018-08-07 – 2018-08-18 (×25): 100 mg via ORAL
  Filled 2018-08-07 (×30): qty 1

## 2018-08-07 MED ORDER — CITALOPRAM HYDROBROMIDE 10 MG PO TABS
10.0000 mg | ORAL_TABLET | Freq: Every day | ORAL | Status: DC
  Administered 2018-08-07 – 2018-08-19 (×13): 10 mg via ORAL
  Filled 2018-08-07 (×13): qty 1

## 2018-08-07 MED ORDER — FERROUS SULFATE 325 (65 FE) MG PO TABS
325.0000 mg | ORAL_TABLET | Freq: Every day | ORAL | Status: DC
  Administered 2018-08-07 – 2018-08-18 (×6): 325 mg via ORAL
  Filled 2018-08-07 (×7): qty 1

## 2018-08-07 MED ORDER — METHYLPREDNISOLONE SODIUM SUCC 40 MG IJ SOLR
40.0000 mg | Freq: Two times a day (BID) | INTRAMUSCULAR | Status: DC
  Administered 2018-08-07 – 2018-08-08 (×3): 40 mg via INTRAVENOUS
  Filled 2018-08-07 (×3): qty 1

## 2018-08-07 MED ORDER — ENSURE ENLIVE PO LIQD
237.0000 mL | Freq: Two times a day (BID) | ORAL | Status: DC
  Administered 2018-08-07 – 2018-08-19 (×15): 237 mL via ORAL

## 2018-08-07 MED ORDER — MELATONIN 3 MG PO TABS
3.0000 mg | ORAL_TABLET | Freq: Every day | ORAL | Status: DC
  Administered 2018-08-07 – 2018-08-18 (×11): 3 mg via ORAL
  Filled 2018-08-07 (×13): qty 1

## 2018-08-07 NOTE — H&P (Signed)
TRH H&P    Patient Demographics:    Vicki Garcia, is a 83 y.o. female  MRN: [id number]  DOB - 1935/06/29  Admit Date - 08/06/2018  Referring MD/NP/PA:  Delora Fuel  Outpatient Primary MD for the patient is Janith Lima, MD  Patient coming from:  home  Chief complaint-  covid    HPI:    Vicki Garcia  is a 83 y.o. female, w hypertension, dementia with behavioral disburbance, apparently presents with cough, and agitation, and is covid +.  Her facility [facility] will not take her back covid +. Pt is also agitated and they have not sitter at [facility] which is an ALF and therefore pt will require admission due to behavioral disturbance as well.  Pt is unable to provide any history. No complaints of fever, chills, cp, palp, sob, n/v, diarrhea, brbpr, black stool.   In ED,  T 99.3 P 75   R 15, Bp 153/69  Pox 100% on RA  Na 136, K 3.6, Bun 18, Creatinine 0.85 Wbc 5.1, Hgb 12.4, Plt 245  CXR IMPRESSION: No active disease.  Pt will be admitted for covid + infection as well as dementia with behavior disturbance.     Review of systems:    In addition to the HPI above,  No Fever-chills, No Headache, No changes with Vision or hearing, No problems swallowing food or Liquids, No Chest pain,   No Abdominal pain, No Nausea or Vomiting, bowel movements are regular, No Blood in stool or Urine, No dysuria, No new skin rashes or bruises, No new joints pains-aches,  No new weakness, tingling, numbness in any extremity, No recent weight gain or loss, No polyuria, polydypsia or polyphagia, No significant Mental Stressors.  All other systems reviewed and are negative.    Past History of the following :    Past Medical History:  Diagnosis Date  . Disorder of left rotator cuff   . Diverticulosis of colon    AVM @ colonoscopy 07/2008  . GERD (gastroesophageal reflux disease)   . History of adenomatous  polyp of colon    2010  . History of esophageal dilatation    07/ 2014  . History of esophagitis    07-/ 2014  . History of melanoma excision    1987  right upper arm  . Hyperlipidemia   . Hypertension   . Prolapsed internal hemorrhoids, grade 4   . Seasonal and perennial allergic rhinitis   . Tinnitus    chronic  . Wears glasses   . Wears hearing aid    bilateral      Past Surgical History:  Procedure Laterality Date  . COLONOSCOPY  last one 07-28-2012  . HEMORRHOID SURGERY N/A 04/18/2015   Procedure: HEMORRHOIDECTOMY;  Surgeon: Leighton Ruff, MD;  Location: [facility];  Service: General;  Laterality: N/A;  . LUMBAR FUSION  11-19-2006   L4 - L5  . UPPER GASTROINTESTINAL ENDOSCOPY  07-28-2012      Social History:      Social History   Tobacco  Use  . Smoking status: Former Smoker    Years: 10.00    Types: Cigarettes    Quit date: 01/19/1969    Years since quitting: 49.5  . Smokeless tobacco: Never Used  Substance Use Topics  . Alcohol use: Yes    Alcohol/week: 5.0 standard drinks    Types: 5 Glasses of wine per week    Comment: occasional       Family History :     Family History  Problem Relation Age of Onset  . Skin cancer Brother        also spinal stenosis  . Kidney cancer Brother   . Stomach cancer Brother   . Lung cancer Brother        liver mets  . Coronary artery disease Father   . Hypertension Brother   . Prostate cancer Brother   . Breast cancer Paternal Aunt   . Heart attack Paternal Uncle 49  . Lymphoma Mother        NHL  . Hemolytic uremic syndrome Other        granddaughter  . Colon cancer Maternal Uncle   . Ulcers Daughter   . Stroke Neg Hx       Home Medications:   Prior to Admission medications   Medication Sig Start Date End Date Taking? Authorizing Provider  citalopram (CELEXA) 10 MG tablet TAKE 1 TABLET ONCE DAILY. Patient taking differently: Take 10 mg by mouth daily.  05/01/18  Yes Janith Lima, MD   donepezil (ARICEPT) 5 MG tablet Take 1 tablet (5 mg total) by mouth at bedtime. 02/28/18  Yes Dennie Bible, NP  feeding supplement, ENSURE ENLIVE, (ENSURE ENLIVE) LIQD Take 237 mLs by mouth 2 (two) times daily between meals. 04/28/17  Yes Aline August, MD  ferrous sulfate 325 (65 FE) MG tablet Take 1 tablet (325 mg total) by mouth 2 (two) times daily with a meal. Patient taking differently: Take 325 mg by mouth daily with breakfast.  05/05/17  Yes Janith Lima, MD  latanoprost (XALATAN) 0.005 % ophthalmic solution Place 1 drop into both eyes at bedtime.  06/15/18  Yes [provider]  LORazepam 1 MG/0.5ML CONC Apply 1 mL topically every 8 (eight) hours as needed (anxiety).   Yes [provider]  losartan-hydrochlorothiazide (HYZAAR) 100-12.5 MG tablet TAKE 1 TABLET ONCE DAILY. Patient taking differently: Take 1 tablet by mouth daily.  04/11/18  Yes Janith Lima, MD  Melatonin 3 MG TABS Take 3 mg by mouth at bedtime.   Yes [provider]     Allergies:     Allergies  Allergen Reactions  . Codeine Other (See Comments)    REACTION: violently ill with N&V  . Namzaric [Memantine Hcl-Donepezil Hcl] Nausea And Vomiting    Pt reports illness. .  Josem Kaufmann [Amlodipine Besylate] Nausea And Vomiting  . Vasotec Other (See Comments)    cough     Physical Exam:   Vitals  Blood pressure (!) 147/68, pulse 90, temperature 99.2 F (37.3 C), temperature source Oral, resp. rate 16, SpO2 100 %.  1.  General: axoxo1  2. Psychiatric: upset  3. Neurologic: cn2-12 intact, reflexes 2+ symmetric, diffuse with no clonus, motor 5/5 in all 4 ext  4. HEENMT:  Anicteric, pupils 1.33m symmetric, direct, consensual, near intact Neck: no jvd  5. Respiratory : CTAB  6. Cardiovascular : rrr s1, s2,   7. Gastrointestinal:  Abd: soft, nt, nd, +bs  8. Skin:  Ext: no c/c/e,  No rash  9.Musculoskeletal:  Good ROM,    No adenoapthy    Data Review:    CBC  Recent Labs  Lab 08/06/18 2141  WBC 5.1  HGB 12.4  HCT 38.5  PLT 245  MCV 94.6  MCH 30.5  MCHC 32.2  RDW 12.1   ------------------------------------------------------------------------------------------------------------------  Results for orders placed or performed during the hospital encounter of 08/06/18 (from the past 48 hour(s))  CBC     Status: None   Collection Time: 08/06/18  9:41 PM  Result Value Ref Range   WBC 5.1 4.0 - 10.5 K/uL   RBC 4.07 3.87 - 5.11 MIL/uL   Hemoglobin 12.4 12.0 - 15.0 g/dL   HCT 38.5 36.0 - 46.0 %   MCV 94.6 80.0 - 100.0 fL   MCH 30.5 26.0 - 34.0 pg   MCHC 32.2 30.0 - 36.0 g/dL   RDW 12.1 11.5 - 15.5 %   Platelets 245 150 - 400 K/uL   nRBC 0.0 0.0 - 0.2 %    Comment: Performed at [facility], [place] [address] [place], [place] [address]  Basic metabolic panel     Status: Abnormal   Collection Time: 08/06/18  9:41 PM  Result Value Ref Range   Sodium 136 135 - 145 mmol/L   Potassium 3.6 3.5 - 5.1 mmol/L   Chloride 95 (L) 98 - 111 mmol/L   CO2 30 22 - 32 mmol/L   Glucose, Bld 103 (H) 70 - 99 mg/dL   BUN 18 8 - 23 mg/dL   Creatinine, Ser 0.85 0.44 - 1.00 mg/dL   Calcium 9.6 8.9 - 10.3 mg/dL   GFR calc non Af Amer >60 >60 mL/min   GFR calc Af Amer >60 >60 mL/min   Anion gap 11 5 - 15    Comment: Performed at [facility], [place] [address], [place], [place] [address]  SARS Coronavirus 2 (CEPHEID- Performed in [place] hospital lab), Hosp Order     Status: Abnormal   Collection Time: 08/06/18  9:41 PM   Specimen: Nasopharyngeal Swab  Result Value Ref Range   SARS Coronavirus 2 POSITIVE (A) NEGATIVE    Comment: RESULT CALLED TO, READ BACK BY AND VERIFIED WITH: BEAULAURIER AT 007 ON 08/07/2018 BY JPM (NOTE) If result is NEGATIVE SARS-CoV-2 target nucleic acids are NOT DETECTED. The SARS-CoV-2 RNA is generally detectable in upper and lower  respiratory specimens during the acute phase of infection. The  lowest  concentration of SARS-CoV-2 viral copies this assay can detect is 250  copies / mL. A negative result does not preclude SARS-CoV-2 infection  and should not be used as the sole basis for treatment or other  patient management decisions.  A negative result may occur with  improper specimen collection / handling, submission of specimen other  than nasopharyngeal swab, presence of viral mutation(s) within the  areas targeted by this assay, and inadequate number of viral copies  (<250 copies / mL). A negative result must be combined with clinical  observations, patient history, and epidemiological information. If result is POSITIVE SARS-CoV-2 target nucleic acids are DETECTED.  The SARS-CoV-2 RNA is generally detectable in upper and lower  respiratory specimens during the acute phase of infection.  Positive  results are indicative of active infection with SARS-CoV-2.  Clinical  correlation with patient history and other diagnostic information is  necessary to determine patient infection status.  Positive results do  not rule out bacterial infection or co-infection with other  viruses. If result is PRESUMPTIVE POSTIVE SARS-CoV-2 nucleic acids MAY BE PRESENT.   A presumptive positive result was obtained on the submitted specimen  and confirmed on repeat testing.  While 2019 novel coronavirus  (SARS-CoV-2) nucleic acids may be present in the submitted sample  additional confirmatory testing may be necessary for epidemiological  and / or clinical management purposes  to differentiate between  SARS-CoV-2 and other Sarbecovirus currently known to infect humans.  If clinically indicated additional testing with an alternate test  methodology [phone])  is advised. The SARS-CoV-2 RNA is generally  detectable in upper and lower respiratory specimens during the acute  phase of infection. The expected result is Negative. Fact Sheet for Patients:  StrictlyIdeas.no  Fact Sheet for Healthcare Providers: BankingDealers.co.za This test is not yet approved or cleared by the [place] FDA and has been authorized for detection and/or diagnosis of SARS-CoV-2 by FDA under an Emergency Use Authorization (EUA).  This EUA will remain in effect (meaning this test can be used) for the duration of the COVID-19 declaration under Section 564(b)(1) of the Act, 21 U.S.C. section 360bbb-3(b)(1), unless the authorization is terminated or revoked sooner. Performed at [facility], [address] [place], [place] [address]     Chemistries  Recent Labs  Lab 08/06/18 2141  NA 136  K 3.6  CL 95*  CO2 30  GLUCOSE 103*  BUN 18  CREATININE 0.85  CALCIUM 9.6   ------------------------------------------------------------------------------------------------------------------  ------------------------------------------------------------------------------------------------------------------ GFR: CrCl cannot be calculated (Unknown ideal weight.). Liver Function Tests: No results for input(s): AST, ALT, ALKPHOS, BILITOT, PROT, ALBUMIN in the last 168 hours. No results for input(s): LIPASE, AMYLASE in the last 168 hours. No results for input(s): AMMONIA in the last 168 hours. Coagulation Profile: No results for input(s): INR, PROTIME in the last 168 hours. Cardiac Enzymes: No results for input(s): CKTOTAL, CKMB, CKMBINDEX, TROPONINI in the last 168 hours. BNP (last 3 results) No results for input(s): PROBNP in the last 8760 hours. HbA1C: No results for input(s): HGBA1C in the last 72 hours. CBG: No results for input(s): GLUCAP in the last 168 hours. Lipid Profile: No results for input(s): CHOL, HDL, LDLCALC, TRIG, CHOLHDL, LDLDIRECT in the last 72 hours. Thyroid Function Tests: No results for input(s): TSH, T4TOTAL, FREET4, T3FREE, THYROIDAB in the last 72 hours. Anemia Panel: No results for input(s): VITAMINB12,  FOLATE, FERRITIN, TIBC, IRON, RETICCTPCT in the last 72 hours.  --------------------------------------------------------------------------------------------------------------- Urine analysis:    Component Value Date/Time   COLORURINE YELLOW 04/26/2017 1614   APPEARANCEUR HAZY (A) 04/26/2017 1614   LABSPEC 1.010 04/26/2017 1614   PHURINE 5.0 04/26/2017 1614   GLUCOSEU NEGATIVE 04/26/2017 1614   GLUCOSEU NEGATIVE 11/11/2015 1058   HGBUR SMALL (A) 04/26/2017 1614   BILIRUBINUR NEGATIVE 04/26/2017 1614   KETONESUR NEGATIVE 04/26/2017 1614   PROTEINUR NEGATIVE 04/26/2017 1614   UROBILINOGEN 0.2 11/11/2015 1058   NITRITE NEGATIVE 04/26/2017 1614   LEUKOCYTESUR TRACE (A) 04/26/2017 1614      Imaging Results:    Dg Chest Portable 1 View  Result Date: 08/06/2018 CLINICAL DATA:  83 year old female with cough. EXAM: PORTABLE CHEST 1 VIEW COMPARISON:  Chest radiograph dated 12/13/2017 FINDINGS: The patient is tilted to the left. Minimal left lung base atelectatic changes. No focal consolidation, or pleural effusion. Thin lucency along the upper mediastinum to the left of midline, likely artifact. A pneumothorax is less likely. The cardiac silhouette is within normal limits. No acute osseous pathology. IMPRESSION: No active disease. Electronically Signed  By: Anner Crete M.D.   On: 08/06/2018 21:27       Assessment & Plan:    Principal Problem:   COVID-19 virus infection Active Problems:   Essential hypertension   Senile dementia with behavioral disturbance ([place])  Covid -19 positive Pt is in a high risk group age >66  Check crp, esr, cpk, ldh, IL-6, ferritin,  Monitor for now Pt is unable to return to [facility] until they talk to their main office.  Please call [facility] in am to find out what their criteria are to accept the patient back.   Dementia with behavioral disturbance.  Monitor, using posey, and soft wrist restraints due to patient trying to pull off her mask and  purposely cough on people.  Cont Aricept 46m po qday Cont Celexa 132mpo qday Cont Lorazepam  Hypertension Cont Hyzaar 100/12.34m47mo qday  Iron deficiency Cont ferrous sulfate  DVT Prophylaxis-   Lovenox - SCDs   AM Labs Ordered, also please review Full Orders  Family Communication: Admission, patients condition and plan of care including tests being ordered have been discussed with the patient  who indicate understanding and agree with the plan and Code Status.  Code Status:  FULL CODE,  Attempted to notify spouse, PauEddie Dibblesat [facility],  Left message on phone.   Admission status: Observation : Based on patients clinical presentation and evaluation of above clinical data, I have made determination that patient meets observation criteria at this time.   Time spent in minutes : 70 minutes   JamJani GravelD on 08/07/2018 at 4:04 AM

## 2018-08-07 NOTE — ED Notes (Signed)
Patient returned by [facility] bc facility refused to accept pt.

## 2018-08-07 NOTE — ED Notes (Signed)
Patient very uncooperative, speaking in word salad, aggressive and combative with staff. Pt pulling all socks out of drawer and throwing them on floor, fulling monitor and pulse ox off. MD made aware.

## 2018-08-07 NOTE — Progress Notes (Signed)
Spoke with daughter, Langley Gauss, and updated on plan of care. Verbalized understanding.

## 2018-08-07 NOTE — Progress Notes (Signed)
CSW was notified that pt is resident of [facility]. Facility unable to isolate pt with COVID19 currently. Spoke with [facility] (agency who provides in-house therapy and support services to [facility] [facility]) and they and facility are aware of plan for pt transfer to [facility].   Sharren Bridge, MSW, LCSW Transitions of Care 08/07/2018 [phone]

## 2018-08-07 NOTE — Progress Notes (Signed)
[facility]                                                                              Patient Demographics  Vicki Garcia, is a 83 y.o. female, DOB - 1935-10-28, CWU:[id number]  Admit date - 08/06/2018   Admitting Physician Jani Gravel, MD  Outpatient Primary MD for the patient is Janith Lima, MD  Outpatient specialists:   LOS - 0  days   Medical records reviewed and are as summarized below:    Chief Complaint  Patient presents with  . Cough       Brief summary   Patient is an 83 year old female with dementia, hypertension presented to ED from [place] with persistent coughing for 1 day and agitation.  Patient was found to be COVID positive.  Facility reported several positive COVID-19 patients at the facility.  No documented fever. Patient was unable to provide any history due to dementia and agitation In ED, low-grade temp 99.3 F, respiratory rate 15, blood pressure 153/69, O2 sats 100% on room air initially, then down to 88%.  Chest x-ray showed no consolidation or infiltrates. COVID-19 positive  Assessment & Plan    Principal Problem:   Acute respiratory disease due to COVID-19 virus -Patient in high risk group age > 80 years, COVID-19 positive -Check daily CRP, CPK, LDH, ferritin, d-dimer.  - Ferritin 51, procalcitonin less than 0.1, LDH 148, ESR 10 -Placed on Solu-Medrol 40 mg every 12 hours, albuterol inhaler as needed, currently no wheezing -Placed on symptomatic supportive management, Tessalon Perles, vitamin C, zinc, Pepcid, O2 via nasal cannula as needed for sats > 94%  Active Problems:   Essential hypertension -BP elevated overnight likely due to agitation, restarted losartan, HCTZ - will place on hydralazine IV as needed with parameters    Senile dementia with behavioral disturbance (HCC) -Somewhat agitated, continue Celexa, Aricept, required restraints and Haldol in ED.   Code Status: Full code DVT Prophylaxis:  Lovenox   Family Communication: Unable to reach patient's husband on the phone, left detailed voicemail message.   Disposition Plan: Plan for transfer to [facility] when beds available  Time Spent in minutes 35 minutes  Procedures:  None  Consultants:   None  Antimicrobials:   Anti-infectives (From admission, onward)   None          Medications  Scheduled Meds: . citalopram  10 mg Oral Daily  . donepezil  5 mg Oral QHS  . enoxaparin (LOVENOX) injection  40 mg Subcutaneous Q24H  . feeding supplement (ENSURE ENLIVE)  237 mL Oral BID BM  . ferrous sulfate  325 mg Oral Q breakfast  . losartan  100 mg Oral Daily   And  . hydrochlorothiazide  12.5 mg Oral Daily  . latanoprost  1 drop Both Eyes QHS  . Melatonin  3 mg Oral QHS  . sodium chloride flush  3 mL Intravenous Q12H   Continuous Infusions: . sodium chloride     PRN Meds:.sodium chloride, LORazepam, sodium chloride flush      Subjective:   Vicki Garcia was seen and examined today.  Confused, somewhat agitated, in restraints.  Unable to obtain review of system from the patient.  Low-grade temp of 99 F otherwise no acute wheezing or shortness of breath.  No nausea or vomiting.  Objective:   Vitals:   08/07/18 0600 08/07/18 0630 08/07/18 0742 08/07/18 0909  BP: 127/79 128/60 (!) 153/67 132/69  Pulse: 90 81 88 89  Resp: (!) '21 20 19 20  '$ Temp:    99 F (37.2 C)  TempSrc:    Oral  SpO2: 99% 100% 98% 100%  Weight:    45.8 kg   No intake or output data in the 24 hours ending 08/07/18 1046   Wt Readings from Last 3 Encounters:  08/07/18 45.8 kg  07/06/18 44.9 kg  03/14/18 44.9 kg     Exam  General: Alert and oriented to self, confused and somewhat agitated, underlying dementia  Eyes:   HEENT:  Atraumatic, normocephalic  Cardiovascular: S1 S2 auscultated, Regular rate and rhythm.  Respiratory: Clear to auscultation bilaterally, no wheezing, rales or rhonchi  Gastrointestinal: Soft, nontender, nondistended,  + bowel sounds  Ext: no pedal edema bilaterally  Neuro: Unable to assess, patient agitated and in restraints  Musculoskeletal: No digital cyanosis, clubbing  Skin: No rashes  Psych: Confused   Data Reviewed:  I have personally reviewed following labs and imaging studies  Micro Results Recent Results (from the past 240 hour(s))  SARS Coronavirus 2 (CEPHEID- Performed in [place] hospital lab), Hosp Order     Status: Abnormal   Collection Time: 08/06/18  9:41 PM   Specimen: Nasopharyngeal Swab  Result Value Ref Range Status   SARS Coronavirus 2 POSITIVE (A) NEGATIVE Final    Comment: RESULT CALLED TO, READ BACK BY AND VERIFIED WITH: BEAULAURIER AT 007 ON 08/07/2018 BY JPM (NOTE) If result is NEGATIVE SARS-CoV-2 target nucleic acids are NOT DETECTED. The SARS-CoV-2 RNA is generally detectable in upper and lower  respiratory specimens during the acute phase of infection. The lowest  concentration of SARS-CoV-2 viral copies this assay can detect is 250  copies / mL. A negative result does not preclude SARS-CoV-2 infection  and should not be used as the sole basis for treatment or other  patient management decisions.  A negative result may occur with  improper specimen collection / handling, submission of specimen other  than nasopharyngeal swab, presence of viral mutation(s) within the  areas targeted by this assay, and inadequate number of viral copies  (<250 copies / mL). A negative result must be combined with clinical  observations, patient history, and epidemiological information. If result is POSITIVE SARS-CoV-2 target nucleic acids are DETECTED.  The SARS-CoV-2 RNA is generally detectable in upper and lower  respiratory specimens during the acute phase of infection.  Positive  results are indicative of active infection with SARS-CoV-2.  Clinical  correlation with patient history and other diagnostic information is  necessary to determine patient infection status.   Positive results do  not rule out bacterial infection or co-infection with other viruses. If result is PRESUMPTIVE POSTIVE SARS-CoV-2 nucleic acids MAY BE PRESENT.   A presumptive positive result was obtained on the submitted specimen  and confirmed on repeat testing.  While 2019 novel coronavirus  (SARS-CoV-2) nucleic acids may be present in the submitted sample  additional confirmatory testing may be necessary for epidemiological  and / or clinical management purposes  to differentiate between  SARS-CoV-2 and other Sarbecovirus currently known to infect humans.  If clinically indicated additional testing with an alternate test  methodology [phone])  is  advised. The SARS-CoV-2 RNA is generally  detectable in upper and lower respiratory specimens during the acute  phase of infection. The expected result is Negative. Fact Sheet for Patients:  StrictlyIdeas.no Fact Sheet for Healthcare Providers: BankingDealers.co.za This test is not yet approved or cleared by the [place] FDA and has been authorized for detection and/or diagnosis of SARS-CoV-2 by FDA under an Emergency Use Authorization (EUA).  This EUA will remain in effect (meaning this test can be used) for the duration of the COVID-19 declaration under Section 564(b)(1) of the Act, 21 U.S.C. section 360bbb-3(b)(1), unless the authorization is terminated or revoked sooner. Performed at [facility], [place] [address], [place], [place] [address]     Radiology Reports Dg Chest Portable 1 View  Result Date: 08/06/2018 CLINICAL DATA:  83 year old female with cough. EXAM: PORTABLE CHEST 1 VIEW COMPARISON:  Chest radiograph dated 12/13/2017 FINDINGS: The patient is tilted to the left. Minimal left lung base atelectatic changes. No focal consolidation, or pleural effusion. Thin lucency along the upper mediastinum to the left of midline, likely artifact. A pneumothorax is  less likely. The cardiac silhouette is within normal limits. No acute osseous pathology. IMPRESSION: No active disease. Electronically Signed   By: Anner Crete M.D.   On: 08/06/2018 21:27    Lab Data:  CBC: Recent Labs  Lab 08/06/18 2141 08/07/18 0511  WBC 5.1 4.4  NEUTROABS  --  3.2  HGB 12.4 11.4*  HCT 38.5 34.0*  MCV 94.6 92.6  PLT 245 409   Basic Metabolic Panel: Recent Labs  Lab 08/06/18 2141 08/07/18 0511  NA 136 133*  K 3.6 3.8  CL 95* 96*  CO2 30 26  GLUCOSE 103* 100*  BUN 18 14  CREATININE 0.85 0.64  CALCIUM 9.6 9.2   GFR: Estimated Creatinine Clearance: 39.9 mL/min (by C-G formula based on SCr of 0.64 mg/dL). Liver Function Tests: Recent Labs  Lab 08/07/18 0511  AST 21  ALT 19  ALKPHOS 62  BILITOT 0.8  PROT 6.7  ALBUMIN 4.1   No results for input(s): LIPASE, AMYLASE in the last 168 hours. No results for input(s): AMMONIA in the last 168 hours. Coagulation Profile: No results for input(s): INR, PROTIME in the last 168 hours. Cardiac Enzymes: Recent Labs  Lab 08/07/18 0511  CKTOTAL 147  CKMB 5.0   BNP (last 3 results) No results for input(s): PROBNP in the last 8760 hours. HbA1C: No results for input(s): HGBA1C in the last 72 hours. CBG: No results for input(s): GLUCAP in the last 168 hours. Lipid Profile: No results for input(s): CHOL, HDL, LDLCALC, TRIG, CHOLHDL, LDLDIRECT in the last 72 hours. Thyroid Function Tests: No results for input(s): TSH, T4TOTAL, FREET4, T3FREE, THYROIDAB in the last 72 hours. Anemia Panel: Recent Labs    08/07/18 0511  FERRITIN 51   Urine analysis:    Component Value Date/Time   COLORURINE YELLOW 04/26/2017 1614   APPEARANCEUR HAZY (A) 04/26/2017 1614   LABSPEC 1.010 04/26/2017 1614   PHURINE 5.0 04/26/2017 1614   GLUCOSEU NEGATIVE 04/26/2017 1614   GLUCOSEU NEGATIVE 11/11/2015 1058   HGBUR SMALL (A) 04/26/2017 1614   BILIRUBINUR NEGATIVE 04/26/2017 [place] 04/26/2017 1614    PROTEINUR NEGATIVE 04/26/2017 1614   UROBILINOGEN 0.2 11/11/2015 1058   NITRITE NEGATIVE 04/26/2017 1614   LEUKOCYTESUR TRACE (A) 04/26/2017 1614     Travante Knee M.D. [facility] 08/07/2018, 10:46 AM  Pager: [phone] Between 7am to 7pm - call Pager -  [phone]  After 7pm go to [email or web address] - password [email or web address]  Call night coverage person covering after 7pm

## 2018-08-07 NOTE — ED Notes (Signed)
Spoke with patients daughter Kathi Ludwig home # [phone] Cell- [phone]

## 2018-08-07 NOTE — ED Notes (Signed)
Provider at bedside

## 2018-08-07 NOTE — ED Provider Notes (Signed)
Patient had been discharged from the ED, however, the skilled nursing facility where she lives is refusing to accept her because of positive COVID-19 test.  Case is discussed with Dr. Maudie Mercury, will admit under observation status to have social service try to find appropriate facility to take her while she is convalescing from the coronavirus.   Delora Fuel, MD 75/10/25 [phone]
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
## 2018-08-07 NOTE — ED Notes (Signed)
Patient combative and unable to redirect, pt refused BP before dc and ripped mask off her face every time it was placed on. Pt transported home by [facility]

## 2018-08-08 DIAGNOSIS — J069 Acute upper respiratory infection, unspecified: Secondary | ICD-10-CM | POA: Diagnosis not present

## 2018-08-08 DIAGNOSIS — U071 COVID-19: Secondary | ICD-10-CM | POA: Diagnosis not present

## 2018-08-08 DIAGNOSIS — F0391 Unspecified dementia with behavioral disturbance: Secondary | ICD-10-CM | POA: Diagnosis not present

## 2018-08-08 DIAGNOSIS — I10 Essential (primary) hypertension: Secondary | ICD-10-CM | POA: Diagnosis not present

## 2018-08-08 LAB — CBC WITH DIFFERENTIAL/PLATELET
Abs Immature Granulocytes: 0.02 10*3/uL (ref 0.00–0.07)
Basophils Absolute: 0 10*3/uL (ref 0.0–0.1)
Basophils Relative: 0 %
Eosinophils Absolute: 0 10*3/uL (ref 0.0–0.5)
Eosinophils Relative: 0 %
HCT: 36.8 % (ref 36.0–46.0)
Hemoglobin: 11.7 g/dL — ABNORMAL LOW (ref 12.0–15.0)
Immature Granulocytes: 1 %
Lymphocytes Relative: 17 %
Lymphs Abs: 0.4 10*3/uL — ABNORMAL LOW (ref 0.7–4.0)
MCH: 29.8 pg (ref 26.0–34.0)
MCHC: 31.8 g/dL (ref 30.0–36.0)
MCV: 93.9 fL (ref 80.0–100.0)
Monocytes Absolute: 0.2 10*3/uL (ref 0.1–1.0)
Monocytes Relative: 8 %
Neutro Abs: 1.9 10*3/uL (ref 1.7–7.7)
Neutrophils Relative %: 74 %
Platelets: 199 10*3/uL (ref 150–400)
RBC: 3.92 MIL/uL (ref 3.87–5.11)
RDW: 11.9 % (ref 11.5–15.5)
WBC: 2.6 10*3/uL — ABNORMAL LOW (ref 4.0–10.5)
nRBC: 0 % (ref 0.0–0.2)

## 2018-08-08 LAB — COMPREHENSIVE METABOLIC PANEL
ALT: 21 U/L (ref 0–44)
AST: 30 U/L (ref 15–41)
Albumin: 3.7 g/dL (ref 3.5–5.0)
Alkaline Phosphatase: 56 U/L (ref 38–126)
Anion gap: 12 (ref 5–15)
BUN: 16 mg/dL (ref 8–23)
CO2: 25 mmol/L (ref 22–32)
Calcium: 9 mg/dL (ref 8.9–10.3)
Chloride: 95 mmol/L — ABNORMAL LOW (ref 98–111)
Creatinine, Ser: 0.76 mg/dL (ref 0.44–1.00)
GFR calc Af Amer: >60 mL/min (ref >60)
GFR calc non Af Amer: >60 mL/min (ref >60)
Glucose, Bld: 137 mg/dL — ABNORMAL HIGH (ref 70–99)
Potassium: 3.9 mmol/L (ref 3.5–5.1)
Sodium: 132 mmol/L — ABNORMAL LOW (ref 135–145)
Total Bilirubin: 0.4 mg/dL (ref 0.3–1.2)
Total Protein: 6.3 g/dL — ABNORMAL LOW (ref 6.5–8.1)

## 2018-08-08 LAB — LACTATE DEHYDROGENASE: LDH: 148 U/L (ref 98–192)

## 2018-08-08 LAB — FERRITIN: Ferritin: 155 ng/mL (ref 11–307)

## 2018-08-08 LAB — CK: Total CK: 311 U/L — ABNORMAL HIGH (ref 38–234)

## 2018-08-08 LAB — D-DIMER, QUANTITATIVE: D-Dimer, Quant: 1.78 ug/mL-FEU — ABNORMAL HIGH (ref 0.00–0.50)

## 2018-08-08 LAB — GLUCOSE, CAPILLARY: Glucose-Capillary: 140 mg/dL — ABNORMAL HIGH (ref 70–99)

## 2018-08-08 LAB — C-REACTIVE PROTEIN: CRP: 1.3 mg/dL — ABNORMAL HIGH (ref <1.0)

## 2018-08-08 MED ORDER — HALOPERIDOL LACTATE 5 MG/ML IJ SOLN
1.0000 mg | Freq: Four times a day (QID) | INTRAMUSCULAR | Status: DC | PRN
  Administered 2018-08-08 – 2018-08-10 (×3): 1 mg via INTRAVENOUS
  Filled 2018-08-08 (×3): qty 1

## 2018-08-08 MED ORDER — QUETIAPINE FUMARATE 25 MG PO TABS
25.0000 mg | ORAL_TABLET | Freq: Every day | ORAL | Status: DC
  Administered 2018-08-08: 25 mg via ORAL
  Filled 2018-08-08: qty 1

## 2018-08-08 NOTE — Progress Notes (Signed)
[facility]                                                                              Patient Demographics  Vicki Garcia, is a 83 y.o. female, DOB - [birth year], VCB:[id number]  Admit date - 08/06/2018   Admitting Physician Jani Gravel, MD  Outpatient Primary MD for the patient is Janith Lima, MD  Outpatient specialists:   LOS - 1  days   Medical records reviewed and are as summarized below:    Chief Complaint  Patient presents with  . Cough       Brief summary   Patient is an 83 year old female with dementia, hypertension presented to ED from [place] with persistent coughing for 1 day and agitation.  Patient was found to be COVID positive.  Facility reported several positive COVID-19 patients at the facility.  No documented fever. Patient was unable to provide any history due to dementia and agitation In ED, low-grade temp 99.3 F, respiratory rate 15, blood pressure 153/69, O2 sats 100% on room air initially, then down to 88%.  Chest x-ray showed no consolidation or infiltrates. COVID-19 positive  Assessment & Plan    Principal Problem:   Acute respiratory disease due to COVID-19 virus -Patient in high risk group age > 53 years, COVID-19 positive -CK rising 311, LDH 148  ferritin 155, CRP 1.3, d-dimer 1.7 - check daily CRP, CPK, LDH, ferritin, d-dimer.  -Continue IV Solu-Medrol 40 mg IVq12 hours, currently O2 sats 97% on room air, no hypoxia -Continue albuterol inhaler as needed, currently no wheezing -Continue supportive management, Tessalon Perles, vitamin C, zinc, Pepcid, O2 via nasal cannula as needed for sats > 94%  Active Problems:   Essential hypertension -BP currently soft, hold losartan, HCTZ today  -Continue hydralazine IV as needed with parameters    Senile dementia with behavioral disturbance (HCC) - continue Celexa, Aricept  Code Status: Full code DVT Prophylaxis:  Lovenox  Family Communication: discussed with patient's  daughter, Langley Gauss on phone and updated in detail   Disposition Plan: Patient is from [facility] facility, facility will not take her back while COVID positive. Facility reported several positive COVID-19 patients at the facility. Transfer to [facility] when bed available.   Time Spent in minutes 25 mins  Procedures:  None  Consultants:   None  Antimicrobials:   Anti-infectives (From admission, onward)   None         Medications  Scheduled Meds: . benzonatate  100 mg Oral TID  . citalopram  10 mg Oral Daily  . donepezil  5 mg Oral QHS  . enoxaparin (LOVENOX) injection  40 mg Subcutaneous Q24H  . famotidine  40 mg Oral Daily  . feeding supplement (ENSURE ENLIVE)  237 mL Oral BID BM  . ferrous sulfate  325 mg Oral Q breakfast  . losartan  100 mg Oral Daily   And  . hydrochlorothiazide  12.5 mg Oral Daily  . latanoprost  1 drop Both Eyes QHS  . Melatonin  3 mg Oral QHS  . methylPREDNISolone (SOLU-MEDROL) injection  40 mg Intravenous BID  . sodium chloride flush  3  mL Intravenous Q12H  . vitamin C  500 mg Oral Daily  . zinc sulfate  220 mg Oral Daily   Continuous Infusions: . sodium chloride     PRN Meds:.sodium chloride, acetaminophen, albuterol, hydrALAZINE, LORazepam, sodium chloride flush      Subjective:   Vicki Garcia was seen and examined today.  Somewhat confused, did not sleep well overnight.  Afebrile.  O2 sats 97% on room air, no acute wheezing or shortness of breath.  No nausea vomiting or diarrhea.  Unable to obtain review of system from the patient due to dementia.   Objective:   Vitals:   08/07/18 1454 08/07/18 2033 08/08/18 0648 08/08/18 0814  BP: (!) 132/55 (!) 95/44 (!) 118/37 (!) 102/44  Pulse: 90 88 (!) 54 (!) 57  Resp: (!) 22 16 18    Temp: 99.4 F (37.4 C) 98.3 F (36.8 C)  97.7 F (36.5 C)  TempSrc: Oral Oral    SpO2: 95% 96% 97%   Weight:      Height:        Intake/Output Summary (Last 24 hours) at 08/08/2018 0910 Last data filed  at 08/07/2018 1153 Gross per 24 hour  Intake 240 ml  Output -  Net 240 ml     Wt Readings from Last 3 Encounters:  08/07/18 45.8 kg  07/06/18 44.9 kg  03/14/18 44.9 kg    Physical Exam  General: Alert and oriented x self, NAD, somewhat confused, underlying dementia  Eyes:   HEENT:  Atraumatic, normocephalic  Cardiovascular: S1 S2 clear, RRR no pedal edema b/l  Respiratory: CTAB, no wheezing, rales or rhonchi  Gastrointestinal: Soft, nontender, nondistended, NBS  Ext: no pedal edema bilaterally  Neuro: no new deficits  Musculoskeletal: No cyanosis, clubbing  Skin: No rashes  Psych: Confused, dementia      Data Reviewed:  I have personally reviewed following labs and imaging studies  Micro Results Recent Results (from the past 240 hour(s))  SARS Coronavirus 2 (CEPHEID- Performed in [place] hospital lab), Hosp Order     Status: Abnormal   Collection Time: 08/06/18  9:41 PM   Specimen: Nasopharyngeal Swab  Result Value Ref Range Status   SARS Coronavirus 2 POSITIVE (A) NEGATIVE Final    Comment: RESULT CALLED TO, READ BACK BY AND VERIFIED WITH: BEAULAURIER AT 007 ON 08/07/2018 BY JPM (NOTE) If result is NEGATIVE SARS-CoV-2 target nucleic acids are NOT DETECTED. The SARS-CoV-2 RNA is generally detectable in upper and lower  respiratory specimens during the acute phase of infection. The lowest  concentration of SARS-CoV-2 viral copies this assay can detect is 250  copies / mL. A negative result does not preclude SARS-CoV-2 infection  and should not be used as the sole basis for treatment or other  patient management decisions.  A negative result may occur with  improper specimen collection / handling, submission of specimen other  than nasopharyngeal swab, presence of viral mutation(s) within the  areas targeted by this assay, and inadequate number of viral copies  (<250 copies / mL). A negative result must be combined with clinical  observations, patient  history, and epidemiological information. If result is POSITIVE SARS-CoV-2 target nucleic acids are DETECTED.  The SARS-CoV-2 RNA is generally detectable in upper and lower  respiratory specimens during the acute phase of infection.  Positive  results are indicative of active infection with SARS-CoV-2.  Clinical  correlation with patient history and other diagnostic information is  necessary to determine patient infection status.  Positive results  do  not rule out bacterial infection or co-infection with other viruses. If result is PRESUMPTIVE POSTIVE SARS-CoV-2 nucleic acids MAY BE PRESENT.   A presumptive positive result was obtained on the submitted specimen  and confirmed on repeat testing.  While 2019 novel coronavirus  (SARS-CoV-2) nucleic acids may be present in the submitted sample  additional confirmatory testing may be necessary for epidemiological  and / or clinical management purposes  to differentiate between  SARS-CoV-2 and other Sarbecovirus currently known to infect humans.  If clinically indicated additional testing with an alternate test  methodology [phone])  is advised. The SARS-CoV-2 RNA is generally  detectable in upper and lower respiratory specimens during the acute  phase of infection. The expected result is Negative. Fact Sheet for Patients:  StrictlyIdeas.no Fact Sheet for Healthcare Providers: BankingDealers.co.za This test is not yet approved or cleared by the [place] FDA and has been authorized for detection and/or diagnosis of SARS-CoV-2 by FDA under an Emergency Use Authorization (EUA).  This EUA will remain in effect (meaning this test can be used) for the duration of the COVID-19 declaration under Section 564(b)(1) of the Act, 21 U.S.C. section 360bbb-3(b)(1), unless the authorization is terminated or revoked sooner. Performed at [facility], [place] [address], [place],  [place] [address]   MRSA PCR Screening     Status: None   Collection Time: 08/07/18 11:53 AM   Specimen: Nasal Mucosa; Nasopharyngeal  Result Value Ref Range Status   MRSA by PCR NEGATIVE NEGATIVE Final    Comment:        The GeneXpert MRSA Assay (FDA approved for NASAL specimens only), is one component of a comprehensive MRSA colonization surveillance program. It is not intended to diagnose MRSA infection nor to guide or monitor treatment for MRSA infections. Performed at [facility], [place] [address], [place], [place] [address]     Radiology Reports Dg Chest Portable 1 View  Result Date: 08/06/2018 CLINICAL DATA:  83 year old female with cough. EXAM: PORTABLE CHEST 1 VIEW COMPARISON:  Chest radiograph dated 12/13/2017 FINDINGS: The patient is tilted to the left. Minimal left lung base atelectatic changes. No focal consolidation, or pleural effusion. Thin lucency along the upper mediastinum to the left of midline, likely artifact. A pneumothorax is less likely. The cardiac silhouette is within normal limits. No acute osseous pathology. IMPRESSION: No active disease. Electronically Signed   By: Anner Crete M.D.   On: 08/06/2018 21:27    Lab Data:  CBC: Recent Labs  Lab 08/06/18 2141 08/07/18 0511 08/08/18 0505  WBC 5.1 4.4 2.6*  NEUTROABS  --  3.2 1.9  HGB 12.4 11.4* 11.7*  HCT 38.5 34.0* 36.8  MCV 94.6 92.6 93.9  PLT 245 210 545   Basic Metabolic Panel: Recent Labs  Lab 08/06/18 2141 08/07/18 0511 08/08/18 0505  NA 136 133* 132*  K 3.6 3.8 3.9  CL 95* 96* 95*  CO2 30 26 25   GLUCOSE 103* 100* 137*  BUN 18 14 16   CREATININE 0.85 0.64 0.76  CALCIUM 9.6 9.2 9.0   GFR: Estimated Creatinine Clearance: 39.6 mL/min (by C-G formula based on SCr of 0.76 mg/dL). Liver Function Tests: Recent Labs  Lab 08/07/18 0511 08/08/18 0505  AST 21 30  ALT 19 21  ALKPHOS 62 56  BILITOT 0.8 0.4  PROT 6.7 6.3*  ALBUMIN 4.1 3.7   No results for input(s):  LIPASE, AMYLASE in the last 168 hours. No results for input(s): AMMONIA in the last  168 hours. Coagulation Profile: No results for input(s): INR, PROTIME in the last 168 hours. Cardiac Enzymes: Recent Labs  Lab 08/07/18 0511 08/08/18 0505  CKTOTAL 147 311*  CKMB 5.0  --    BNP (last 3 results) No results for input(s): PROBNP in the last 8760 hours. HbA1C: No results for input(s): HGBA1C in the last 72 hours. CBG: No results for input(s): GLUCAP in the last 168 hours. Lipid Profile: No results for input(s): CHOL, HDL, LDLCALC, TRIG, CHOLHDL, LDLDIRECT in the last 72 hours. Thyroid Function Tests: No results for input(s): TSH, T4TOTAL, FREET4, T3FREE, THYROIDAB in the last 72 hours. Anemia Panel: Recent Labs    08/07/18 0511 08/08/18 0505  FERRITIN 51 155   Urine analysis:    Component Value Date/Time   COLORURINE YELLOW 04/26/2017 1614   APPEARANCEUR HAZY (A) 04/26/2017 1614   LABSPEC 1.010 04/26/2017 1614   PHURINE 5.0 04/26/2017 1614   GLUCOSEU NEGATIVE 04/26/2017 1614   GLUCOSEU NEGATIVE 11/11/2015 1058   HGBUR SMALL (A) 04/26/2017 1614   BILIRUBINUR NEGATIVE 04/26/2017 [place] 04/26/2017 1614   PROTEINUR NEGATIVE 04/26/2017 1614   UROBILINOGEN 0.2 11/11/2015 1058   NITRITE NEGATIVE 04/26/2017 1614   LEUKOCYTESUR TRACE (A) 04/26/2017 1614     Fields Oros M.D. [facility] 08/08/2018, 9:10 AM  Pager: [phone] Between 7am to 7pm - call Pager - [phone]  After 7pm go to [email or web address] - password [email or web address]  Call night coverage person covering after 7pm

## 2018-08-08 NOTE — Plan of Care (Signed)
Discussed with patient and family plan of care for the evening, limitations to ambulating in the hall and her disease process with COVID positive status with some teach back displayed and needs to be reoriented with patient.  She has a 1:1 sitter in the room and family updated.

## 2018-08-08 NOTE — Progress Notes (Signed)
Pt arrived to unit, [facility], transferred from stretcher to bed.  Pt placed on Cardiac monitor and O2 since the Pt is listed as Telemetry Pt.   Pt stable in the bed

## 2018-08-08 NOTE — Progress Notes (Signed)
Called back daughter to inform her that medications are working at this time and her mother is calm and resting.  Restraint orders still in effect but on hold at this time.

## 2018-08-08 NOTE — Progress Notes (Signed)
Patient have been off restraints, sleeping, not trying to get out of bed or pulling tube. Will continue to assess patient.

## 2018-08-08 NOTE — Progress Notes (Signed)
Updated daughter regarding transfer to [facility]. Provided unit # and room #.

## 2018-08-08 NOTE — Progress Notes (Signed)
Tele-sitter in room. Attempting to redirect pt with no success. Pt continues to get OOB, pulling it lines and wires, not following commands. Charge nurse and MD made aware that pt is not able to follow commands and redirection of tele-sitter and is at risk of falling. Bed in low position, pt in room near nurses station, bed alarm in place. Will continue to monitor

## 2018-08-08 NOTE — Progress Notes (Signed)
Initial Nutrition Assessment  RD working remotely.   DOCUMENTATION CODES:   Not applicable  INTERVENTION:  - continue Ensure Enlive BID, each supplement provides 350 kcal and 20 grams of protein. - will order Magic Cup BID with meals, each supplement provides 290 kcal and 9 grams of protein. - continue to encourage PO intakes.    NUTRITION DIAGNOSIS:   Increased nutrient needs related to acute illness as evidenced by estimated needs.  GOAL:   Patient will meet greater than or equal to 90% of their needs  MONITOR:   PO intake, Supplement acceptance, Labs, Weight trends  REASON FOR ASSESSMENT:   Malnutrition Screening Tool  ASSESSMENT:   83 year old female with dementia and HTN. She presented to ED from [facility] with persistent coughing for 1 day and agitation. Patient was found to be COVID positive. Facility reported several positive COVID-19 patients at the facility. Patient was unable to provide any history due to dementia and agitation. CXR showed no consolidation or infiltrates.  No intakes documented since admission. RN flow sheet indicates that patient is a/o to self only. Per chart review, current weight is 101 lb. Weight has been stable x1 year. Ensure Enlive ordered BID per ONS protocol at the time of admission and patient has accepted 1 of the 2 bottles of this supplement offered to her.  Per notes: - acute respiratory failure d/t COVID-19 - senile dementia with behavioral disturbances--RN notes indicate patient able to be off restraints overnight   Labs reviewed; Na: 132 mmol/l, Cl: 95 mmol/l.  Medications reviewed; 40 mg oral pepcid/day, 325 mg ferrous sulfate/day, 3 mg melatonin/night, 40 mg solu-medrol BID, 500 mg ascorbic acid/day, 220 mg zinc sulfate/day.      NUTRITION - FOCUSED PHYSICAL EXAM:  unable to complete for COVID+ patient  Diet Order:   Diet Order            Diet regular Room service appropriate? Yes; Fluid consistency: Thin  Diet  effective now              EDUCATION NEEDS:   No education needs have been identified at this time  Skin:  Skin Assessment: Reviewed RN Assessment  Last BM:  PTA/unknown  Height:   Ht Readings from Last 1 Encounters:  08/07/18 5' (1.524 m)    Weight:   Wt Readings from Last 1 Encounters:  08/07/18 45.8 kg    Ideal Body Weight:  45.4 kg  BMI:  Body mass index is 19.72 kg/m.  Estimated Nutritional Needs:   Kcal:  1500-1700 kcal  Protein:  65-80 grams  Fluid:  >/= 1.8 L/day     Jarome Matin, MS, RD, LDN, [facility] Inpatient Clinical Dietitian Pager # [phone] After hours/weekend pager # [phone]

## 2018-08-08 NOTE — Progress Notes (Signed)
Patient continue to rest without restraints, Will continue to assess patient.

## 2018-08-09 DIAGNOSIS — B342 Coronavirus infection, unspecified: Secondary | ICD-10-CM | POA: Diagnosis present

## 2018-08-09 LAB — CBC WITH DIFFERENTIAL/PLATELET
Abs Immature Granulocytes: 0.01 10*3/uL (ref 0.00–0.07)
Basophils Absolute: 0 10*3/uL (ref 0.0–0.1)
Basophils Relative: 1 %
Eosinophils Absolute: 0 10*3/uL (ref 0.0–0.5)
Eosinophils Relative: 0 %
HCT: 41.5 % (ref 36.0–46.0)
Hemoglobin: 14 g/dL (ref 12.0–15.0)
Immature Granulocytes: 0 %
Lymphocytes Relative: 25 %
Lymphs Abs: 1.4 10*3/uL (ref 0.7–4.0)
MCH: 30.8 pg (ref 26.0–34.0)
MCHC: 33.7 g/dL (ref 30.0–36.0)
MCV: 91.4 fL (ref 80.0–100.0)
Monocytes Absolute: 1 10*3/uL (ref 0.1–1.0)
Monocytes Relative: 18 %
Neutro Abs: 3.1 10*3/uL (ref 1.7–7.7)
Neutrophils Relative %: 56 %
Platelets: 280 10*3/uL (ref 150–400)
RBC: 4.54 MIL/uL (ref 3.87–5.11)
RDW: 11.9 % (ref 11.5–15.5)
WBC: 5.5 10*3/uL (ref 4.0–10.5)
nRBC: 0 % (ref 0.0–0.2)

## 2018-08-09 LAB — COMPREHENSIVE METABOLIC PANEL
ALT: 31 U/L (ref 0–44)
AST: 47 U/L — ABNORMAL HIGH (ref 15–41)
Albumin: 4 g/dL (ref 3.5–5.0)
Alkaline Phosphatase: 61 U/L (ref 38–126)
Anion gap: 12 (ref 5–15)
BUN: 33 mg/dL — ABNORMAL HIGH (ref 8–23)
CO2: 29 mmol/L (ref 22–32)
Calcium: 9.7 mg/dL (ref 8.9–10.3)
Chloride: 93 mmol/L — ABNORMAL LOW (ref 98–111)
Creatinine, Ser: 0.91 mg/dL (ref 0.44–1.00)
GFR calc Af Amer: >60 mL/min (ref >60)
GFR calc non Af Amer: 59 mL/min — ABNORMAL LOW (ref >60)
Glucose, Bld: 89 mg/dL (ref 70–99)
Potassium: 3.5 mmol/L (ref 3.5–5.1)
Sodium: 134 mmol/L — ABNORMAL LOW (ref 135–145)
Total Bilirubin: 0.5 mg/dL (ref 0.3–1.2)
Total Protein: 7.1 g/dL (ref 6.5–8.1)

## 2018-08-09 LAB — INTERLEUKIN-6, PLASMA: Interleukin-6, Plasma: 27.1 pg/mL — ABNORMAL HIGH (ref 0.0–12.2)

## 2018-08-09 MED ORDER — HALOPERIDOL LACTATE 5 MG/ML IJ SOLN
2.0000 mg | Freq: Once | INTRAMUSCULAR | Status: AC
  Administered 2018-08-09: 2 mg via INTRAVENOUS
  Filled 2018-08-09: qty 1

## 2018-08-09 MED ORDER — NON FORMULARY: 3.0000 mg | Freq: Every day | Status: DC

## 2018-08-09 MED ORDER — ACETAMINOPHEN 500 MG PO TABS
1000.0000 mg | ORAL_TABLET | Freq: Every day | ORAL | Status: DC
  Administered 2018-08-09: 500 mg via ORAL
  Administered 2018-08-10 – 2018-08-18 (×8): 1000 mg via ORAL
  Filled 2018-08-09 (×10): qty 2

## 2018-08-09 MED ORDER — QUETIAPINE FUMARATE 25 MG PO TABS
25.0000 mg | ORAL_TABLET | Freq: Every day | ORAL | Status: DC
  Administered 2018-08-09: 21:00:00 25 mg via ORAL
  Filled 2018-08-09: qty 1

## 2018-08-09 NOTE — Progress Notes (Signed)
Patient trying to leave, agitated, combative. Paged MD on call. New order for one time dose IV haldol ordered and given. Will continue to monitor.

## 2018-08-09 NOTE — Progress Notes (Signed)
Spoke with pt's daughter via telephone. Updated on plan of care.

## 2018-08-09 NOTE — Progress Notes (Signed)
1400 Pt starting to get agitated and argumentative with staff. Discussed with MD via telephone and reviewed available meds. MD stated to give IV haldol if necessary.   1422 Pt becoming agitated and combative while walking in hallway with sitter. RN attempted to redirect and pt cussing at staff. Pt given IV haldol. Pt able to be returned to the room with sitter.

## 2018-08-09 NOTE — Progress Notes (Addendum)
This is a no charge note. The patient was seen and examined and her chart was reviewed thoroughly.  The patient was transferred from her memory care facility for SARS-CoV-2 testing.  She tested positive, and because of lack of isolation protocols at her facility, could not be discharged home.  She was placed in observation status due to this inability to safely disposition her from the ER home.  She will be continued on her home medications.  Hospital administration is working towards a safe discharge plan.      Brief Narrative:  Mrs. Vicki Garcia is a 83 y.o. F with advanced dementia in memory Care at [facility] and HTN who presented with cough.  In the ER, respiratory rate normal, O2 sats 100% on room air, CXR clear.         Assessment & Plan:  Coronavirus infection without lung involvement H&P mentions hypoxia but no flowsheets record hypoxia nor supplemental O2 at any time, no indication for anti-inflammatories nor antivirals. -Check SpO2 daily -No VTE PPx necessary for this outpatient   Hypertension BP normal -Continue home HCTZ and losartan  Dementia with behavioral disturbance Patient difficult to redirect in new setting -Continue home citalopram and donepezil -Acetaminophen 1000 mg QHS and melatonin QHS -PRN Haldol preferred over physical restraints Delirium precautions:   -Lights and TV off, minimize interruptions at night  -Blinds open and lights on during day  -Glasses/hearing aid with patient  -Frequent reorientation  -PT/OT when able  -Avoid sedation medications/Beers list medications   Hyponatremia Mild.  Na normal at admission.  Na today 134, stable.   -Repeat Na in 5 days

## 2018-08-09 NOTE — Evaluation (Signed)
Physical Therapy Evaluation Patient Details Name: Vicki Garcia MRN: [id number] DOB: 1935/03/08 Today's Date: 08/09/2018   History of Present Illness  83 year old female with dementia, hypertension. lt rotator cuff disorder, tinnitus, hearing loss with bil hearing aids presented to ED from [facility] with persistent coughing for 1 day and agitation.  Patient was found to be COVID positive.     Clinical Impression  Pt admitted with above diagnosis. Patient generally weak and scored only 13/56 on Berg Balance Assessment (<45 indicative of nearly 100% chance of fall). Pt currently with functional limitations due to the deficits listed below (see PT Problem List).  Pt will benefit from skilled PT to increase their independence and safety with mobility to allow discharge to the venue listed below.       Follow Up Recommendations SNF;Supervision/Assistance - 24 hour    Equipment Recommendations  None recommended by PT    Recommendations for Other Services OT consult     Precautions / Restrictions Precautions Precautions: Fall      Mobility  Bed Mobility               General bed mobility comments: OOB on arrival  Transfers Overall transfer level: Needs assistance Equipment used: None Transfers: Sit to/from Stand Sit to Stand: Min guard         General transfer comment: x3 transfers; uses wide BOS, bil UEs to push off; appears unsteady but no overt LOB  Ambulation/Gait Ambulation/Gait assistance: Min assist Gait Distance (Feet): 75 Feet(x2; standing rest break) Assistive device: None;1 person hand held assist Gait Pattern/deviations: Step-through pattern;Decreased stride length;Shuffle;Narrow base of support Gait velocity: decreased Gait velocity interpretation: <1.8 ft/sec, indicate of risk for recurrent falls General Gait Details: stops to answer questions; will not turn her head when walking; appears very guarded (no UE swing)  Stairs             Wheelchair Mobility    Modified Rankin (Stroke Patients Only)       Balance Overall balance assessment: Needs assistance         Standing balance support: No upper extremity supported;During functional activity Standing balance-Leahy Scale: Poor Standing balance comment: if bending/reaching needs UE support                 Standardized Balance Assessment Standardized Balance Assessment : Berg Balance Test Berg Balance Test Sit to Stand: Needs minimal aid to stand or to stabilize Standing Unsupported: Able to stand 30 seconds unsupported Sitting with Back Unsupported but Feet Supported on Floor or Stool: Able to sit safely and securely 2 minutes Stand to Sit: Needs assistance to sit(to low toilet) Transfers: Needs one person to assist Standing Unsupported with Eyes Closed: Unable to keep eyes closed 3 seconds but stays steady Standing Ubsupported with Feet Together: Needs help to attain position and unable to hold for 15 seconds From Standing, Reach Forward with Outstretched Arm: Reaches forward but needs supervision From Standing Position, Pick up Object from Floor: Unable to pick up and needs supervision From Standing Position, Turn to Look Behind Over each Shoulder: Needs supervision when turning Turn 360 Degrees: Needs close supervision or verbal cueing Standing Unsupported, Alternately Place Feet on Step/Stool: Needs assistance to keep from falling or unable to try Standing Unsupported, One Foot in Front: Loses balance while stepping or standing Standing on One Leg: Unable to try or needs assist to prevent fall Total Score: 13         Pertinent Vitals/Pain Pain Assessment: No/denies pain  Home Living Family/patient expects to be discharged to:: Assisted living               Home Equipment: (pt unable to report; likely grab bars at least) Additional Comments: Pt denies using RW or cane and when presented RW this is clearly not familiar to her     Prior Function Level of Independence: (unknown)         Comments: she denies needs; ? reliable     Hand Dominance        Extremity/Trunk Assessment   Upper Extremity Assessment Upper Extremity Assessment: Generalized weakness    Lower Extremity Assessment Lower Extremity Assessment: Generalized weakness    Cervical / Trunk Assessment Cervical / Trunk Assessment: Normal  Communication   Communication: HOH  Cognition Arousal/Alertness: Awake/alert Behavior During Therapy: WFL for tasks assessed/performed Overall Cognitive Status: No family/caregiver present to determine baseline cognitive functioning                                 General Comments: knows she is not at [facility], but does not recall she is in the hospital even after told      General Comments      Exercises     Assessment/Plan    PT Assessment Patient needs continued PT services  PT Problem List Decreased strength;Decreased activity tolerance;Decreased balance;Decreased mobility;Decreased cognition;Decreased knowledge of use of DME;Decreased safety awareness;Decreased knowledge of precautions;Cardiopulmonary status limiting activity       PT Treatment Interventions DME instruction;Gait training;Functional mobility training;Therapeutic activities;Therapeutic exercise;Balance training;Cognitive remediation;Patient/family education    PT Goals (Current goals can be found in the Care Plan section)  Acute Rehab PT Goals Patient Stated Goal: wants to get stronger; walk better PT Goal Formulation: With patient Time For Goal Achievement: 08/23/18 Potential to Achieve Goals: Good    Frequency Min 2X/week   Barriers to discharge Decreased caregiver support from ALF; needs SNF    Co-evaluation               AM-PAC PT "6 Clicks" Mobility  Outcome Measure Help needed turning from your back to your side while in a flat bed without using bedrails?: None Help needed moving  from lying on your back to sitting on the side of a flat bed without using bedrails?: A Little Help needed moving to and from a bed to a chair (including a wheelchair)?: A Little Help needed standing up from a chair using your arms (e.g., wheelchair or bedside chair)?: A Little Help needed to walk in hospital room?: A Little Help needed climbing 3-5 steps with a railing? : A Lot 6 Click Score: 18    End of Session Equipment Utilized During Treatment: Gait belt Activity Tolerance: Patient limited by fatigue Patient left: in chair;with call bell/phone within reach;with nursing/sitter in room Nurse Communication: Mobility status PT Visit Diagnosis: Unsteadiness on feet (R26.81);Muscle weakness (generalized) (M62.81)    Time: 4132-4401 PT Time Calculation (min) (ACUTE ONLY): 25 min   Charges:   PT Evaluation $PT Eval Moderate Complexity: 1 Mod PT Treatments $Gait Training: 8-22 mins          [employer], PT 08/09/2018, 10:22 AM

## 2018-08-10 DIAGNOSIS — U071 COVID-19: Secondary | ICD-10-CM | POA: Diagnosis not present

## 2018-08-10 MED ORDER — HEPARIN SODIUM (PORCINE) 5000 UNIT/ML IJ SOLN
5000.0000 [IU] | Freq: Three times a day (TID) | INTRAMUSCULAR | Status: DC
  Administered 2018-08-10 – 2018-08-19 (×22): 5000 [IU] via SUBCUTANEOUS
  Filled 2018-08-10 (×24): qty 1

## 2018-08-10 MED ORDER — QUETIAPINE FUMARATE 25 MG PO TABS
25.0000 mg | ORAL_TABLET | Freq: Two times a day (BID) | ORAL | Status: DC
  Administered 2018-08-10 (×2): 25 mg via ORAL
  Filled 2018-08-10 (×2): qty 1

## 2018-08-10 MED ORDER — ALBUTEROL SULFATE HFA 108 (90 BASE) MCG/ACT IN AERS: 2.0000 | INHALATION_SPRAY | Freq: Four times a day (QID) | RESPIRATORY_TRACT | Status: DC | PRN

## 2018-08-10 MED ORDER — QUETIAPINE FUMARATE 25 MG PO TABS: 12.5000 mg | ORAL_TABLET | Freq: Every day | ORAL | Status: DC

## 2018-08-10 MED ORDER — HALOPERIDOL LACTATE 5 MG/ML IJ SOLN
2.0000 mg | Freq: Four times a day (QID) | INTRAMUSCULAR | Status: DC | PRN
  Administered 2018-08-10 – 2018-08-12 (×3): 2 mg via INTRAVENOUS
  Filled 2018-08-10 (×6): qty 1

## 2018-08-10 NOTE — Care Management CC44 (Signed)
Condition Code 44 Documentation Completed  Patient Details  Name: Adaria Hole MRN: [id number] Date of Birth: 07-Nov-1935   Condition Code 44 given:  Yes Patient signature on Condition Code 44 notice:  Yes(Permission given for CSW to sign) Documentation of 2 MD's agreement:    Code 44 added to claim:  Yes    Weston Anna, LCSW 08/10/2018, 1:05 PM

## 2018-08-10 NOTE — NC FL2 (Signed)
[place] LEVEL OF CARE SCREENING TOOL     IDENTIFICATION  Patient Name: Vicki Garcia Birthdate: [birth year] Sex: female Admission Date (Current Location): 08/06/2018  [facility] and [place] Number:  Herbalist and Address:  [facility] [place], [place] [address], [place], [place] [address]      Provider Number: [id number]  Attending Physician Name and Address:  Thurnell Lose, MD  Relative Name and Phone Number:       Current Level of Care: Hospital Recommended Level of Care: [place] Prior Approval Number:    Date Approved/Denied:   PASRR Number:   [id number]   Discharge Plan: SNF    Current Diagnoses: Patient Active Problem List   Diagnosis Date Noted  . Coronavirus infection 08/09/2018  . COVID-19 virus infection 08/07/2018  . Acute respiratory disease due to COVID-19 virus 08/07/2018  . Routine general medical examination at a health care facility 03/14/2018  . Iron deficiency anemia due to dietary causes 05/05/2017  . Chronic cough 03/06/2016  . Senile dementia with behavioral disturbance ([place]) 10/24/2014  . Benign esophageal stricture 07/30/2014  . Seasonal and perennial allergic rhinitis 10/29/2008  . MELANOMA, UPPER ARM 04/04/2008  . Essential hypertension 02/22/2007  . Hyperlipidemia LDL goal <160 02/18/2007    Orientation RESPIRATION BLADDER Height & Weight     Self  Normal Continent Weight: 100 lb 15.5 oz (45.8 kg) Height:  5' (152.4 cm)  BEHAVIORAL SYMPTOMS/MOOD NEUROLOGICAL BOWEL NUTRITION STATUS      Continent Diet(see dc summary)  AMBULATORY STATUS COMMUNICATION OF NEEDS Skin   Limited Assist Verbally Normal                       Personal Care Assistance Level of Assistance  Bathing, Feeding, Dressing Bathing Assistance: Limited assistance Feeding assistance: Independent Dressing Assistance: Limited assistance     Functional Limitations Info             SPECIAL CARE FACTORS  FREQUENCY  PT (By licensed PT), OT (By licensed OT)     PT Frequency: 5 OT Frequency: 5            Contractures      Additional Factors Info  Code Status, Allergies Code Status Info: DNR Allergies Info: Codeine Namzaric (Memantine Hcl-donepezil Hcl) Norvasc (Amlodipine Besylate)Vasotec           Current Medications (08/10/2018):  This is the current hospital active medication list Current Facility-Administered Medications  Medication Dose Route Frequency Provider Last Rate Last Dose  . acetaminophen (TYLENOL) tablet 1,000 mg  1,000 mg Oral QHS Edwin Dada, MD   500 mg at 08/09/18 2049  . acetaminophen (TYLENOL) tablet 650 mg  650 mg Oral Q4H PRN Rai, Ripudeep K, MD      . albuterol (VENTOLIN HFA) 108 (90 Base) MCG/ACT inhaler 2 puff  2 puff Inhalation Q6H PRN Thurnell Lose, MD      . benzonatate (TESSALON) capsule 100 mg  100 mg Oral TID Rai, Ripudeep K, MD   100 mg at 08/10/18 1141  . citalopram (CELEXA) tablet 10 mg  10 mg Oral Daily Rai, Ripudeep K, MD   10 mg at 08/10/18 1141  . donepezil (ARICEPT) tablet 5 mg  5 mg Oral QHS Rai, Ripudeep K, MD   5 mg at 08/09/18 2057  . famotidine (PEPCID) tablet 40 mg  40 mg Oral Daily Rai, Ripudeep K, MD   40 mg at 08/10/18 1141  .  feeding supplement (ENSURE ENLIVE) (ENSURE ENLIVE) liquid 237 mL  237 mL Oral BID BM Rai, Ripudeep K, MD   237 mL at 08/10/18 1141  . ferrous sulfate tablet 325 mg  325 mg Oral Q breakfast Rai, Ripudeep K, MD   325 mg at 08/09/18 1628  . haloperidol lactate (HALDOL) injection 2 mg  2 mg Intravenous Q6H PRN Thurnell Lose, MD   2 mg at 08/10/18 1544  . heparin injection 5,000 Units  5,000 Units Subcutaneous Q8H Lala Lund K, MD      . hydrALAZINE (APRESOLINE) injection 10 mg  10 mg Intravenous Q6H PRN Rai, Ripudeep K, MD      . latanoprost (XALATAN) 0.005 % ophthalmic solution 1 drop  1 drop Both Eyes QHS Rai, Ripudeep K, MD   1 drop at 08/09/18 2052  . Melatonin TABS 3 mg  3 mg Oral QHS  Rai, Ripudeep K, MD   3 mg at 08/09/18 2050  . QUEtiapine (SEROQUEL) tablet 25 mg  25 mg Oral BID Thurnell Lose, MD   25 mg at 08/10/18 1548  . vitamin C (ASCORBIC ACID) tablet 500 mg  500 mg Oral Daily Rai, Ripudeep K, MD   500 mg at 08/09/18 0921  . zinc sulfate capsule 220 mg  220 mg Oral Daily Rai, Ripudeep K, MD   220 mg at 08/10/18 1141     Discharge Medications: Please see discharge summary for a list of discharge medications.  Relevant Imaging Results:  Relevant Lab Results:   Additional Information [id number]  Weston Anna, LCSW

## 2018-08-10 NOTE — Progress Notes (Addendum)
During family update, daughter raised concerns over her mother's code status being changed to DNR. Attempted to explain the difference in DNR versus full code, and both daughter and spouse stated that in the event of an emergency they would want all measures taken to resuscitate the patient.   Paged MD for clarification, he discussed with family a second time and patient is a DNR.

## 2018-08-10 NOTE — Progress Notes (Signed)
SLP Cancellation Note  Patient Details Name: Vicki Garcia MRN: [id number] DOB: January 19, 1936   Cancelled treatment:        Pt agitated earlier, demanding to walk in hall with RN (see RN note). Bedside swallow assessment deferred at this time. Will continue efforts. She stated pt "seems to swallow fine."                                                                                                [name] 08/10/2018, 4:17 PM  Orbie Pyo Colvin Caroli.Ed Risk analyst [phone] Office [phone]

## 2018-08-10 NOTE — Progress Notes (Signed)
Patient came agitated, stating " I need to get out of here" and she demanded to walk in the hallway as well as into doffing room and off the unit. Multiple staff members attempted to redirect her, but were unsuccessful.  PRN Haldol was given. Patients daughter was able to speak to her on the phone and patient agreed to return to her room and eat ice cream. The patient continued to get up and attempted to leave her room multiple times. MD was paged and orders were put in for increased haldol and seroquel. Female sitter was switched for female sitter, which seemed to reduce her anxiety. Patient called and spoke with her husband. Patient is now resting comfortably in bed.

## 2018-08-10 NOTE — Progress Notes (Addendum)
PROGRESS NOTE                                                                                                                                                                                                             Patient Demographics:    Vicki Garcia, is a 83 y.o. female, DOB - [birth year], IFX:[id number]  Outpatient Primary MD for the patient is Janith Lima, MD    LOS - 2  Admit date - 08/06/2018    Chief Complaint  Patient presents with  . Cough       Brief Narrative   Vicki Garcia  is a 83 y.o. female, w hypertension, dementia with behavioral disburbance, apparently presents with cough, and agitation, and is covid +.  Her facility [facility] will not take her back covid +. Pt is also agitated and they have not sitter at [facility] which is an ALF and therefore pt will require admission due to behavioral disturbance as well.  Pt is unable to provide any history. No complaints of fever, chills, cp, palp, sob, n/v, diarrhea, brbpr, black stool.     Subjective:    Vicki Garcia today has, No headache, No chest pain, No abdominal pain - No Nausea, No new weakness tingling or numbness, No Cough or SOB.  But overall quite unreliable historian   Assessment  & Plan :     1. Acute Hypoxic Resp. Failure due to Acute Covid 19 Viral Pneumonitis during the ongoing 2020 Covid 19 Pandemic -  Stable for now on room air, inflammatory markers relatively stable, continue to monitor her.  She will require placement.     ABG     Component Value Date/Time   HCO3 25.7 04/26/2017 2039   TCO2 27 04/26/2017 2039   O2SAT 57.0 04/26/2017 2039    COVID-19 Labs  Recent Labs    08/08/18 0505  DDIMER 1.78*  FERRITIN 155  LDH 148  CRP 1.3*    Lab Results  Component Value Date   SARSCOV2NAA POSITIVE (A) 08/06/2018   [place] Not Detected 07/06/2018     Hepatic Function Latest Ref Rng & Units 08/09/2018 08/08/2018  08/07/2018  Total Protein 6.5 - 8.1 g/dL 7.1 6.3(L) 6.7  Albumin 3.5 - 5.0 g/dL 4.0 3.7 4.1  AST 15 - 41 U/L 47(H) 30 21  ALT 0 - 44 U/L 31  21 19  Alk Phosphatase 38 - 126 U/L 61 56 62  Total Bilirubin 0.3 - 1.2 mg/dL 0.5 0.4 0.8  Bilirubin, Direct 0.0 - 0.3 mg/dL - - -        Component Value Date/Time   BNP 50.9 08/07/2018 0511      2.  Advanced dementia.  Does have some hospital-acquired delirium, encephalopathy which is nonspecific and likely due to being in the unfamiliar setting.  PRN IV Haldol, to new nighttime Seroquel at the lowest possible dose, fall precautions, remote camera sitter.  Will also involve PT OT, advance activity exposed to sunlight to see if it helps.  Speech therapy also to assist.  For now soft diet with feeding assistance and aspiration precautions.  Baseline quality of life appears to be quite poor.  3.  Hypertension.  Blood pressure soft.  Hold Hyzaar.  4.  History of iron deficiency anemia.  Continue oral iron supplements.      Condition - Extremely Guarded  Family Communication  :  Daughter on 08/10/18, explained making her mom DNR, explained her 2nd time, she agrees.  Code Status :  DNR  Diet :    Diet Order            Diet regular Room service appropriate? Yes; Fluid consistency: Thin  Diet effective now               Disposition Plan  :  SNF  Consults  :  None  Procedures  :  None  PUD Prophylaxis :    DVT Prophylaxis  :   Heparin    Lab Results  Component Value Date   PLT 280 08/09/2018    Inpatient Medications  Scheduled Meds: . acetaminophen  1,000 mg Oral QHS  . benzonatate  100 mg Oral TID  . citalopram  10 mg Oral Daily  . donepezil  5 mg Oral QHS  . famotidine  40 mg Oral Daily  . feeding supplement (ENSURE ENLIVE)  237 mL Oral BID BM  . ferrous sulfate  325 mg Oral Q breakfast  . losartan  100 mg Oral Daily   And  . hydrochlorothiazide  12.5 mg Oral Daily  . latanoprost  1 drop Both Eyes QHS  . Melatonin  3  mg Oral QHS  . QUEtiapine  25 mg Oral QHS  . vitamin C  500 mg Oral Daily  . zinc sulfate  220 mg Oral Daily   Continuous Infusions: PRN Meds:.acetaminophen, haloperidol lactate, hydrALAZINE  Antibiotics  :    Anti-infectives (From admission, onward)   None       Time Spent in minutes  30   Lala Lund M.D on 08/10/2018 at 10:03 AM  To page go to [email or web address] - password [facility]  [facility] -  Office  [phone]  See all Orders from today for further details    Objective:   Vitals:   08/09/18 2106 08/10/18 0528 08/10/18 0530 08/10/18 0733  BP: (!) 156/74 (!) 109/51  (!) 117/52  Pulse:   62 68  Resp:    17  Temp: 98.7 F (37.1 C) 98.3 F (36.8 C)  98.1 F (36.7 C)  TempSrc: Oral Oral  Oral  SpO2:   100% 96%  Weight:      Height:        Wt Readings from Last 3 Encounters:  08/07/18 45.8 kg  07/06/18 44.9 kg  03/14/18 44.9 kg     Intake/Output Summary (Last 24 hours) at 08/10/2018  1003 Last data filed at 08/10/2018 0854 Gross per 24 hour  Intake 750 ml  Output 450 ml  Net 300 ml     Physical Exam  Awake but confused, No new F.N deficits,   [place].AT,PERRAL Supple Neck,No JVD, No cervical lymphadenopathy appriciated.  Symmetrical Chest wall movement, Good air movement bilaterally, CTAB RRR,No Gallops,Rubs or new Murmurs, No Parasternal Heave +ve B.Sounds, Abd Soft, No tenderness, No organomegaly appriciated, No rebound - guarding or rigidity. No Cyanosis, Clubbing or edema, No new Rash or bruise      Data Review:    CBC  Recent Labs  Lab 08/06/18 2141 08/07/18 0511 08/08/18 0505 08/09/18 1005  WBC 5.1 4.4 2.6* 5.5  HGB 12.4 11.4* 11.7* 14.0  HCT 38.5 34.0* 36.8 41.5  PLT 245 210 199 280  MCV 94.6 92.6 93.9 91.4  MCH 30.5 31.1 29.8 30.8  MCHC 32.2 33.5 31.8 33.7  RDW 12.1 12.1 11.9 11.9  LYMPHSABS  --  0.4* 0.4* 1.4  MONOABS  --  0.7 0.2 1.0  EOSABS  --  0.1 0.0 0.0  BASOSABS  --  0.0 0.0 0.0    Chemistries    Recent Labs   Lab 08/06/18 2141 08/07/18 0511 08/08/18 0505 08/09/18 1005  NA 136 133* 132* 134*  K 3.6 3.8 3.9 3.5  CL 95* 96* 95* 93*  CO2 30 26 25 29   GLUCOSE 103* 100* 137* 89  BUN 18 14 16  33*  CREATININE 0.85 0.64 0.76 0.91  CALCIUM 9.6 9.2 9.0 9.7  AST  --  21 30 47*  ALT  --  19 21 31   ALKPHOS  --  62 56 61  BILITOT  --  0.8 0.4 0.5   ------------------------------------------------------------------------------------------------------------------ No results for input(s): CHOL, HDL, LDLCALC, TRIG, CHOLHDL, LDLDIRECT in the last 72 hours.  Lab Results  Component Value Date   HGBA1C 5.4 04/27/2017   ------------------------------------------------------------------------------------------------------------------ No results for input(s): TSH, T4TOTAL, T3FREE, THYROIDAB in the last 72 hours.  Invalid input(s): FREET3  Cardiac Enzymes Recent Labs  Lab 08/07/18 0511  CKMB 5.0   ------------------------------------------------------------------------------------------------------------------    Component Value Date/Time   BNP 50.9 08/07/2018 0511    Micro Results Recent Results (from the past 240 hour(s))  SARS Coronavirus 2 (CEPHEID- Performed in [facility] hospital lab), Hosp Order     Status: Abnormal   Collection Time: 08/06/18  9:41 PM   Specimen: Nasopharyngeal Swab  Result Value Ref Range Status   SARS Coronavirus 2 POSITIVE (A) NEGATIVE Final    Comment: RESULT CALLED TO, READ BACK BY AND VERIFIED WITH: BEAULAURIER AT 007 ON 08/07/2018 BY JPM (NOTE) If result is NEGATIVE SARS-CoV-2 target nucleic acids are NOT DETECTED. The SARS-CoV-2 RNA is generally detectable in upper and lower  respiratory specimens during the acute phase of infection. The lowest  concentration of SARS-CoV-2 viral copies this assay can detect is 250  copies / mL. A negative result does not preclude SARS-CoV-2 infection  and should not be used as the sole basis for treatment or other   patient management decisions.  A negative result may occur with  improper specimen collection / handling, submission of specimen other  than nasopharyngeal swab, presence of viral mutation(s) within the  areas targeted by this assay, and inadequate number of viral copies  (<250 copies / mL). A negative result must be combined with clinical  observations, patient history, and epidemiological information. If result is POSITIVE SARS-CoV-2 target nucleic acids are DETECTED.  The SARS-CoV-2 RNA is  generally detectable in upper and lower  respiratory specimens during the acute phase of infection.  Positive  results are indicative of active infection with SARS-CoV-2.  Clinical  correlation with patient history and other diagnostic information is  necessary to determine patient infection status.  Positive results do  not rule out bacterial infection or co-infection with other viruses. If result is PRESUMPTIVE POSTIVE SARS-CoV-2 nucleic acids MAY BE PRESENT.   A presumptive positive result was obtained on the submitted specimen  and confirmed on repeat testing.  While 2019 novel coronavirus  (SARS-CoV-2) nucleic acids may be present in the submitted sample  additional confirmatory testing may be necessary for epidemiological  and / or clinical management purposes  to differentiate between  SARS-CoV-2 and other Sarbecovirus currently known to infect humans.  If clinically indicated additional testing with an alternate test  methodology [phone])  is advised. The SARS-CoV-2 RNA is generally  detectable in upper and lower respiratory specimens during the acute  phase of infection. The expected result is Negative. Fact Sheet for Patients:  StrictlyIdeas.no Fact Sheet for Healthcare Providers: BankingDealers.co.za This test is not yet approved or cleared by the [place] FDA and has been authorized for detection and/or diagnosis of SARS-CoV-2 by  FDA under an Emergency Use Authorization (EUA).  This EUA will remain in effect (meaning this test can be used) for the duration of the COVID-19 declaration under Section 564(b)(1) of the Act, 21 U.S.C. section 360bbb-3(b)(1), unless the authorization is terminated or revoked sooner. Performed at [facility], [place] [address], [place], [place] [address]   MRSA PCR Screening     Status: None   Collection Time: 08/07/18 11:53 AM   Specimen: Nasal Mucosa; Nasopharyngeal  Result Value Ref Range Status   MRSA by PCR NEGATIVE NEGATIVE Final    Comment:        The GeneXpert MRSA Assay (FDA approved for NASAL specimens only), is one component of a comprehensive MRSA colonization surveillance program. It is not intended to diagnose MRSA infection nor to guide or monitor treatment for MRSA infections. Performed at [facility], [place] [address] [place], [place] [address]     Radiology Reports Dg Chest Portable 1 View  Result Date: 08/06/2018 CLINICAL DATA:  83 year old female with cough. EXAM: PORTABLE CHEST 1 VIEW COMPARISON:  Chest radiograph dated 12/13/2017 FINDINGS: The patient is tilted to the left. Minimal left lung base atelectatic changes. No focal consolidation, or pleural effusion. Thin lucency along the upper mediastinum to the left of midline, likely artifact. A pneumothorax is less likely. The cardiac silhouette is within normal limits. No acute osseous pathology. IMPRESSION: No active disease. Electronically Signed   By: Anner Crete M.D.   On: 08/06/2018 21:27

## 2018-08-10 NOTE — Progress Notes (Signed)
Spoke with patient's daughter, Langley Gauss, for updates and to let her speak with her Mother. She really appreciated the call and the care.

## 2018-08-10 NOTE — Evaluation (Signed)
Occupational Therapy Evaluation Patient Details Name: Vicki Garcia MRN: [id number] DOB: 01-18-1936 Today's Date: 08/10/2018    History of Present Illness 83 year old female with dementia, hypertension. lt rotator cuff disorder, tinnitus, hearing loss with bil hearing aids presented to ED from [facility] with persistent coughing for 1 day and agitation.  Patient was found to be COVID positive.    Clinical Impression   PTA, pt was at [facility] on the [facility]; unsure of PLOF due to poor historian but assume supervision for safety. Currently pt able to perform ADLs and functional mobility at supervision level. Pt performed grooming, toileting, and donning underwear with supervision and Max cues for redirection. Pt appears to be at baseline function. Recommend dc to facility with 24/7 supervision for safety. No further acute OT needs and will sign off.     Follow Up Recommendations  No OT follow up;SNF;Supervision/Assistance - 24 hour(Return to Memory Care)    Equipment Recommendations  None recommended by OT    Recommendations for Other Services       Precautions / Restrictions Precautions Precautions: Fall      Mobility Bed Mobility               General bed mobility comments: OOB upon arrival  Transfers Overall transfer level: Needs assistance   Transfers: Sit to/from Stand Sit to Stand: Supervision         General transfer comment: supervision for safety    Balance                                           ADL either performed or assessed with clinical judgement   ADL Overall ADL's : Needs assistance/impaired                                       General ADL Comments: Pt performing ADLs and mobility at Supervision level for safety. Pt performing toileting, grooming, and donning underwear     Vision         Perception     Praxis      Pertinent Vitals/Pain Pain Assessment: No/denies pain      Hand Dominance Right   Extremity/Trunk Assessment Upper Extremity Assessment Upper Extremity Assessment: Generalized weakness   Lower Extremity Assessment Lower Extremity Assessment: Generalized weakness   Cervical / Trunk Assessment Cervical / Trunk Assessment: Normal   Communication Communication Communication: HOH   Cognition Arousal/Alertness: Awake/alert Behavior During Therapy: WFL for tasks assessed/performed Overall Cognitive Status: History of cognitive impairments - at baseline                                 General Comments: Baseline dementia   General Comments  Sitter present throughout    Exercises     Shoulder Instructions      Home Living Family/patient expects to be discharged to:: Other (Comment)(Memory Care Unit)                                 Additional Comments: Unsure due to cognitive deficits      Prior Functioning/Environment Level of Independence: Needs assistance        Comments: Unsure due  to cognition. Assume supervision for safety due to baseline demenita        OT Problem List: Impaired balance (sitting and/or standing);Decreased cognition      OT Treatment/Interventions:      OT Goals(Current goals can be found in the care plan section) Acute Rehab OT Goals Patient Stated Goal: "I like music. Good music. Classical music." OT Goal Formulation: All assessment and education complete, DC therapy  OT Frequency:     Barriers to D/C:            Co-evaluation              AM-PAC OT "6 Clicks" Daily Activity     Outcome Measure Help from another person eating meals?: None Help from another person taking care of personal grooming?: None Help from another person toileting, which includes using toliet, bedpan, or urinal?: None Help from another person bathing (including washing, rinsing, drying)?: None Help from another person to put on and taking off regular upper body clothing?:  None Help from another person to put on and taking off regular lower body clothing?: None 6 Click Score: 24   End of Session Nurse Communication: Mobility status  Activity Tolerance: Patient tolerated treatment well Patient left: in chair;with call bell/phone within reach;with nursing/sitter in room  OT Visit Diagnosis: Other abnormalities of gait and mobility (R26.89);Muscle weakness (generalized) (M62.81);Other symptoms and signs involving cognitive function                Time: 4158-3094 OT Time Calculation (min): 22 min Charges:  OT General Charges $OT Visit: 1 Visit OT Evaluation $OT Eval Moderate Complexity: Bradley, OTR/L Acute Rehab Pager: [phone] Office: [place] 08/10/2018, 4:43 PM

## 2018-08-11 DIAGNOSIS — U071 COVID-19: Secondary | ICD-10-CM | POA: Diagnosis not present

## 2018-08-11 LAB — CBC WITH DIFFERENTIAL/PLATELET
Abs Immature Granulocytes: 0.01 10*3/uL (ref 0.00–0.07)
Basophils Absolute: 0 10*3/uL (ref 0.0–0.1)
Basophils Relative: 1 %
Eosinophils Absolute: 0 10*3/uL (ref 0.0–0.5)
Eosinophils Relative: 0 %
HCT: 34.9 % — ABNORMAL LOW (ref 36.0–46.0)
Hemoglobin: 11.3 g/dL — ABNORMAL LOW (ref 12.0–15.0)
Immature Granulocytes: 0 %
Lymphocytes Relative: 37 %
Lymphs Abs: 1.2 10*3/uL (ref 0.7–4.0)
MCH: 30.1 pg (ref 26.0–34.0)
MCHC: 32.4 g/dL (ref 30.0–36.0)
MCV: 93.1 fL (ref 80.0–100.0)
Monocytes Absolute: 0.8 10*3/uL (ref 0.1–1.0)
Monocytes Relative: 26 %
Neutro Abs: 1.1 10*3/uL — ABNORMAL LOW (ref 1.7–7.7)
Neutrophils Relative %: 36 %
Platelets: 209 10*3/uL (ref 150–400)
RBC: 3.75 MIL/uL — ABNORMAL LOW (ref 3.87–5.11)
RDW: 12 % (ref 11.5–15.5)
WBC: 3.2 10*3/uL — ABNORMAL LOW (ref 4.0–10.5)
nRBC: 0 % (ref 0.0–0.2)

## 2018-08-11 LAB — COMPREHENSIVE METABOLIC PANEL
ALT: 31 U/L (ref 0–44)
AST: 34 U/L (ref 15–41)
Albumin: 3.5 g/dL (ref 3.5–5.0)
Alkaline Phosphatase: 50 U/L (ref 38–126)
Anion gap: 12 (ref 5–15)
BUN: 24 mg/dL — ABNORMAL HIGH (ref 8–23)
CO2: 28 mmol/L (ref 22–32)
Calcium: 9 mg/dL (ref 8.9–10.3)
Chloride: 97 mmol/L — ABNORMAL LOW (ref 98–111)
Creatinine, Ser: 0.78 mg/dL (ref 0.44–1.00)
GFR calc Af Amer: >60 mL/min (ref >60)
GFR calc non Af Amer: >60 mL/min (ref >60)
Glucose, Bld: 86 mg/dL (ref 70–99)
Potassium: 3.7 mmol/L (ref 3.5–5.1)
Sodium: 137 mmol/L (ref 135–145)
Total Bilirubin: 0.2 mg/dL — ABNORMAL LOW (ref 0.3–1.2)
Total Protein: 5.8 g/dL — ABNORMAL LOW (ref 6.5–8.1)

## 2018-08-11 LAB — FERRITIN: Ferritin: 102 ng/mL (ref 11–307)

## 2018-08-11 LAB — BRAIN NATRIURETIC PEPTIDE: B Natriuretic Peptide: 17.5 pg/mL (ref 0.0–100.0)

## 2018-08-11 LAB — MAGNESIUM: Magnesium: 2.1 mg/dL (ref 1.7–2.4)

## 2018-08-11 LAB — D-DIMER, QUANTITATIVE: D-Dimer, Quant: <0.27 ug/mL-FEU (ref 0.00–0.50)

## 2018-08-11 LAB — LACTATE DEHYDROGENASE: LDH: 161 U/L (ref 98–192)

## 2018-08-11 LAB — C-REACTIVE PROTEIN: CRP: <0.8 mg/dL (ref <1.0)

## 2018-08-11 MED ORDER — QUETIAPINE FUMARATE 25 MG PO TABS
50.0000 mg | ORAL_TABLET | Freq: Two times a day (BID) | ORAL | Status: DC
  Administered 2018-08-11 – 2018-08-16 (×11): 50 mg via ORAL
  Administered 2018-08-16: 25 mg via ORAL
  Administered 2018-08-17 – 2018-08-19 (×4): 50 mg via ORAL
  Filled 2018-08-11 (×18): qty 2

## 2018-08-11 MED ORDER — HALOPERIDOL LACTATE 5 MG/ML IJ SOLN
2.0000 mg | Freq: Once | INTRAMUSCULAR | Status: AC
  Administered 2018-08-11: 2 mg via INTRAVENOUS

## 2018-08-11 MED ORDER — QUETIAPINE FUMARATE 25 MG PO TABS
25.0000 mg | ORAL_TABLET | Freq: Two times a day (BID) | ORAL | Status: DC
  Administered 2018-08-11: 25 mg via ORAL
  Filled 2018-08-11: qty 1

## 2018-08-11 NOTE — Progress Notes (Signed)
Patient became agitated around 13:00, PRN haldol given. Sitter was able to walk with patient until she calmed down.    Patient has now become agitated again, wants to leave, continues to walk through the halls looking for her shoes. It took multiple staff members to redirect her. MD paged

## 2018-08-11 NOTE — Progress Notes (Addendum)
PROGRESS NOTE                                                                                                                                                                                                             Patient Demographics:    Vicki Garcia, is a 83 y.o. female, DOB - [birth year], EXB:[id number]  Outpatient Primary MD for the patient is Janith Lima, MD    LOS - 2  Admit date - 08/06/2018    Chief Complaint  Patient presents with  . Cough       Brief Narrative   Vicki Garcia  is a 83 y.o. female, w hypertension, dementia with behavioral disburbance, apparently presents with cough, and agitation, and is covid +.  Her facility [facility] will not take her back covid +. Pt is also agitated and they have not sitter at [facility] which is an ALF and therefore pt will require admission due to behavioral disturbance as well.  Pt is unable to provide any history. No complaints of fever, chills, cp, palp, sob, n/v, diarrhea, brbpr, black stool.     Subjective:   Patient in bed, appears comfortable, denies any headache, no fever, no chest pain or pressure, no shortness of breath , no abdominal pain.    Assessment  & Plan :     1. Acute Hypoxic Resp. Failure due to Acute Covid 19 Viral Pneumonitis during the ongoing 2020 Covid 19 Pandemic -  Stable for now on room air, inflammatory markers relatively stable, continue to monitor her.  She will require placement.   ABG     Component Value Date/Time   HCO3 25.7 04/26/2017 2039   TCO2 27 04/26/2017 2039   O2SAT 57.0 04/26/2017 2039    COVID-19 Labs  Recent Labs    08/11/18 0210 08/11/18 0215  DDIMER  --  <0.27  FERRITIN 102  --   LDH  --  161  CRP <0.8  --     Lab Results  Component Value Date   SARSCOV2NAA POSITIVE (A) 08/06/2018   [place] Not Detected 07/06/2018     Hepatic Function Latest Ref Rng & Units 08/11/2018 08/09/2018 08/08/2018   Total Protein 6.5 - 8.1 g/dL 5.8(L) 7.1 6.3(L)  Albumin 3.5 - 5.0 g/dL 3.5 4.0 3.7  AST 15 - 41 U/L 34 47(H) 30  ALT 0 -  44 U/L 31 31 21   Alk Phosphatase 38 - 126 U/L 50 61 56  Total Bilirubin 0.3 - 1.2 mg/dL 0.2(L) 0.5 0.4  Bilirubin, Direct 0.0 - 0.3 mg/dL - - -        Component Value Date/Time   BNP 17.5 08/11/2018 0210      2.  Advanced dementia.  Does have some hospital-acquired delirium, encephalopathy which is nonspecific and likely due to being in the unfamiliar setting.  PRN IV Haldol, will add nighttime Seroquel at the lowest possible dose, fall precautions, remote camera sitter.  Will also involve PT OT, advance activity exposed to sunlight to see if it helps.  Speech therapy also to assist.  For now soft diet with feeding assistance and aspiration precautions.  Baseline quality of life appears to be quite poor.  3.  Hypertension.  Blood pressure improved will add low-dose Norvasc add PRN hydralazine IV.  4.  History of iron deficiency anemia.  Continue oral iron supplements.      Condition - Extremely Guarded  Family Communication  :  Daughter on 08/10/18 x 2, explained making her mom DNR, explained her 2nd time, she agrees.  Code Status :  DNR  Diet :    Diet Order            DIET SOFT Room service appropriate? Yes; Fluid consistency: Thin  Diet effective now               Disposition Plan  :  SNF  Consults  :  None  Procedures  :  None  PUD Prophylaxis :    DVT Prophylaxis  :   Heparin    Lab Results  Component Value Date   PLT 209 08/11/2018    Inpatient Medications  Scheduled Meds: . acetaminophen  1,000 mg Oral QHS  . benzonatate  100 mg Oral TID  . citalopram  10 mg Oral Daily  . donepezil  5 mg Oral QHS  . famotidine  40 mg Oral Daily  . feeding supplement (ENSURE ENLIVE)  237 mL Oral BID BM  . ferrous sulfate  325 mg Oral Q breakfast  . heparin injection (subcutaneous)  5,000 Units Subcutaneous Q8H  . latanoprost  1 drop Both Eyes  QHS  . Melatonin  3 mg Oral QHS  . QUEtiapine  25 mg Oral BID  . vitamin C  500 mg Oral Daily  . zinc sulfate  220 mg Oral Daily   Continuous Infusions: PRN Meds:.acetaminophen, albuterol, haloperidol lactate, hydrALAZINE  Antibiotics  :    Anti-infectives (From admission, onward)   None       Time Spent in minutes  30   Lala Lund M.D on 08/11/2018 at 8:27 AM  To page go to [email or web address] - password [facility]  [facility] -  Office  [phone]  See all Orders from today for further details    Objective:   Vitals:   08/10/18 2051 08/11/18 0535 08/11/18 0536 08/11/18 0728  BP: (!) 153/76 (!) 150/68    Pulse: 73  69   Resp: 18  16 16   Temp: 98.7 F (37.1 C)  97.8 F (36.6 C) 98.3 F (36.8 C)  TempSrc: Oral  Oral Oral  SpO2: 95%  95%   Weight:      Height:        Wt Readings from Last 3 Encounters:  08/07/18 45.8 kg  07/06/18 44.9 kg  03/14/18 44.9 kg     Intake/Output Summary (Last 24 hours) at  08/11/2018 0827 Last data filed at 08/10/2018 1915 Gross per 24 hour  Intake 640 ml  Output 300 ml  Net 340 ml     Physical Exam  Awake still confused but calm, No new F.N deficits,   [place].AT,PERRAL Supple Neck,No JVD, No cervical lymphadenopathy appriciated.  Symmetrical Chest wall movement, Good air movement bilaterally, CTAB RRR,No Gallops, Rubs or new Murmurs, No Parasternal Heave +ve B.Sounds, Abd Soft, No tenderness, No organomegaly appriciated, No rebound - guarding or rigidity. No Cyanosis, Clubbing or edema, No new Rash or bruise     Data Review:    CBC  Recent Labs  Lab 08/06/18 2141 08/07/18 0511 08/08/18 0505 08/09/18 1005 08/11/18 0215  WBC 5.1 4.4 2.6* 5.5 3.2*  HGB 12.4 11.4* 11.7* 14.0 11.3*  HCT 38.5 34.0* 36.8 41.5 34.9*  PLT 245 210 199 280 209  MCV 94.6 92.6 93.9 91.4 93.1  MCH 30.5 31.1 29.8 30.8 30.1  MCHC 32.2 33.5 31.8 33.7 32.4  RDW 12.1 12.1 11.9 11.9 12.0  LYMPHSABS  --  0.4* 0.4* 1.4 1.2  MONOABS  --  0.7  0.2 1.0 0.8  EOSABS  --  0.1 0.0 0.0 0.0  BASOSABS  --  0.0 0.0 0.0 0.0    Chemistries    Recent Labs  Lab 08/06/18 2141 08/07/18 0511 08/08/18 0505 08/09/18 1005 08/11/18 0215  NA 136 133* 132* 134* 137  K 3.6 3.8 3.9 3.5 3.7  CL 95* 96* 95* 93* 97*  CO2 30 26 25 29 28   GLUCOSE 103* 100* 137* 89 86  BUN 18 14 16  33* 24*  CREATININE 0.85 0.64 0.76 0.91 0.78  CALCIUM 9.6 9.2 9.0 9.7 9.0  MG  --   --   --   --  2.1  AST  --  21 30 47* 34  ALT  --  19 21 31 31   ALKPHOS  --  62 56 61 50  BILITOT  --  0.8 0.4 0.5 0.2*   ------------------------------------------------------------------------------------------------------------------ No results for input(s): CHOL, HDL, LDLCALC, TRIG, CHOLHDL, LDLDIRECT in the last 72 hours.  Lab Results  Component Value Date   HGBA1C 5.4 04/27/2017   ------------------------------------------------------------------------------------------------------------------ No results for input(s): TSH, T4TOTAL, T3FREE, THYROIDAB in the last 72 hours.  Invalid input(s): FREET3  Cardiac Enzymes Recent Labs  Lab 08/07/18 0511  CKMB 5.0   ------------------------------------------------------------------------------------------------------------------    Component Value Date/Time   BNP 17.5 08/11/2018 0210    Micro Results Recent Results (from the past 240 hour(s))  SARS Coronavirus 2 (CEPHEID- Performed in [place] hospital lab), Hosp Order     Status: Abnormal   Collection Time: 08/06/18  9:41 PM   Specimen: Nasopharyngeal Swab  Result Value Ref Range Status   SARS Coronavirus 2 POSITIVE (A) NEGATIVE Final    Comment: RESULT CALLED TO, READ BACK BY AND VERIFIED WITH: BEAULAURIER AT 007 ON 08/07/2018 BY JPM (NOTE) If result is NEGATIVE SARS-CoV-2 target nucleic acids are NOT DETECTED. The SARS-CoV-2 RNA is generally detectable in upper and lower  respiratory specimens during the acute phase of infection. The lowest  concentration of  SARS-CoV-2 viral copies this assay can detect is 250  copies / mL. A negative result does not preclude SARS-CoV-2 infection  and should not be used as the sole basis for treatment or other  patient management decisions.  A negative result may occur with  improper specimen collection / handling, submission of specimen other  than nasopharyngeal swab, presence of viral mutation(s) within the  areas targeted by this assay, and inadequate number of viral copies  (<250 copies / mL). A negative result must be combined with clinical  observations, patient history, and epidemiological information. If result is POSITIVE SARS-CoV-2 target nucleic acids are DETECTED.  The SARS-CoV-2 RNA is generally detectable in upper and lower  respiratory specimens during the acute phase of infection.  Positive  results are indicative of active infection with SARS-CoV-2.  Clinical  correlation with patient history and other diagnostic information is  necessary to determine patient infection status.  Positive results do  not rule out bacterial infection or co-infection with other viruses. If result is PRESUMPTIVE POSTIVE SARS-CoV-2 nucleic acids MAY BE PRESENT.   A presumptive positive result was obtained on the submitted specimen  and confirmed on repeat testing.  While 2019 novel coronavirus  (SARS-CoV-2) nucleic acids may be present in the submitted sample  additional confirmatory testing may be necessary for epidemiological  and / or clinical management purposes  to differentiate between  SARS-CoV-2 and other Sarbecovirus currently known to infect humans.  If clinically indicated additional testing with an alternate test  methodology [phone])  is advised. The SARS-CoV-2 RNA is generally  detectable in upper and lower respiratory specimens during the acute  phase of infection. The expected result is Negative. Fact Sheet for Patients:  StrictlyIdeas.no Fact Sheet for Healthcare  Providers: BankingDealers.co.za This test is not yet approved or cleared by the [place] FDA and has been authorized for detection and/or diagnosis of SARS-CoV-2 by FDA under an Emergency Use Authorization (EUA).  This EUA will remain in effect (meaning this test can be used) for the duration of the COVID-19 declaration under Section 564(b)(1) of the Act, 21 U.S.C. section 360bbb-3(b)(1), unless the authorization is terminated or revoked sooner. Performed at [facility], [place] [address], [place], [place] [address]   MRSA PCR Screening     Status: None   Collection Time: 08/07/18 11:53 AM   Specimen: Nasal Mucosa; Nasopharyngeal  Result Value Ref Range Status   MRSA by PCR NEGATIVE NEGATIVE Final    Comment:        The GeneXpert MRSA Assay (FDA approved for NASAL specimens only), is one component of a comprehensive MRSA colonization surveillance program. It is not intended to diagnose MRSA infection nor to guide or monitor treatment for MRSA infections. Performed at [facility], [place] [address] [place], [place] [address]     Radiology Reports Dg Chest Portable 1 View  Result Date: 08/06/2018 CLINICAL DATA:  83 year old female with cough. EXAM: PORTABLE CHEST 1 VIEW COMPARISON:  Chest radiograph dated 12/13/2017 FINDINGS: The patient is tilted to the left. Minimal left lung base atelectatic changes. No focal consolidation, or pleural effusion. Thin lucency along the upper mediastinum to the left of midline, likely artifact. A pneumothorax is less likely. The cardiac silhouette is within normal limits. No acute osseous pathology. IMPRESSION: No active disease. Electronically Signed   By: Anner Crete M.D.   On: 08/06/2018 21:27

## 2018-08-11 NOTE — Progress Notes (Signed)
Patient's daughter called Network engineer requesting to speak with nurse while this nurse was ambulating patient in hallway.  This nurse told secretary to tell daughter that patient has been a bit restless but patient has enjoyed ambulating in hall.  She did eat some orange sherbert earlier and enjoyed it. Told daughter that haldol was given earlier due to patient's increased restlessness, irritation, and combativeness.

## 2018-08-11 NOTE — Progress Notes (Signed)
Patient ambulated well in hall independently by using front wheel walker.  Approx. 500 feet ambulated.  Ambulation seems to be the best option to decrease patient's restlessness.  Patient is now back in bed resting.

## 2018-08-11 NOTE — Progress Notes (Signed)
  Speech Language Pathology   Patient Details Name: Vicki Garcia MRN: [id number] DOB: [birth year] Today's Date: 08/11/2018 Time:  -      No need for assessment. Yesterday RN stated she had not observed s/s aspiration. Decreased intake appears related to cognitive/behavioral impairments. Will d/c ST orders and discussed with MD who is in agreement.               [name] 08/11/2018, 5:17 PM   Orbie Pyo Colvin Caroli.Ed Risk analyst [phone] Office [phone]

## 2018-08-11 NOTE — Progress Notes (Signed)
Updated the patient's daughter, Langley Gauss. The patient was able to speak to her daughter and husband on the phone which seems to alleviate some of her anxiety

## 2018-08-11 NOTE — Progress Notes (Signed)
Called daughter, Langley Gauss, to give update on patient. She appreciated the call call and the care.

## 2018-08-11 NOTE — Progress Notes (Signed)
Attempted to feed patient lunch.  Patient only ate 3 bites of food and half cup of ice cream.  Declined to eat anything else. Per previous sitter, patient slept through breakfast and didn't not eat anything.

## 2018-08-12 DIAGNOSIS — F0391 Unspecified dementia with behavioral disturbance: Secondary | ICD-10-CM | POA: Diagnosis present

## 2018-08-12 DIAGNOSIS — Z800 Family history of malignant neoplasm of digestive organs: Secondary | ICD-10-CM | POA: Diagnosis not present

## 2018-08-12 DIAGNOSIS — E785 Hyperlipidemia, unspecified: Secondary | ICD-10-CM | POA: Diagnosis present

## 2018-08-12 DIAGNOSIS — Z808 Family history of malignant neoplasm of other organs or systems: Secondary | ICD-10-CM | POA: Diagnosis not present

## 2018-08-12 DIAGNOSIS — Z888 Allergy status to other drugs, medicaments and biological substances status: Secondary | ICD-10-CM | POA: Diagnosis not present

## 2018-08-12 DIAGNOSIS — E871 Hypo-osmolality and hyponatremia: Secondary | ICD-10-CM | POA: Diagnosis present

## 2018-08-12 DIAGNOSIS — Z66 Do not resuscitate: Secondary | ICD-10-CM | POA: Diagnosis present

## 2018-08-12 DIAGNOSIS — Z885 Allergy status to narcotic agent status: Secondary | ICD-10-CM | POA: Diagnosis not present

## 2018-08-12 DIAGNOSIS — Z79890 Hormone replacement therapy (postmenopausal): Secondary | ICD-10-CM | POA: Diagnosis not present

## 2018-08-12 DIAGNOSIS — I10 Essential (primary) hypertension: Secondary | ICD-10-CM | POA: Diagnosis present

## 2018-08-12 DIAGNOSIS — Z85820 Personal history of malignant melanoma of skin: Secondary | ICD-10-CM | POA: Diagnosis not present

## 2018-08-12 DIAGNOSIS — Z87891 Personal history of nicotine dependence: Secondary | ICD-10-CM | POA: Diagnosis not present

## 2018-08-12 DIAGNOSIS — Z803 Family history of malignant neoplasm of breast: Secondary | ICD-10-CM | POA: Diagnosis not present

## 2018-08-12 DIAGNOSIS — Z8051 Family history of malignant neoplasm of kidney: Secondary | ICD-10-CM | POA: Diagnosis not present

## 2018-08-12 DIAGNOSIS — K219 Gastro-esophageal reflux disease without esophagitis: Secondary | ICD-10-CM | POA: Diagnosis present

## 2018-08-12 DIAGNOSIS — Z807 Family history of other malignant neoplasms of lymphoid, hematopoietic and related tissues: Secondary | ICD-10-CM | POA: Diagnosis not present

## 2018-08-12 DIAGNOSIS — Z8042 Family history of malignant neoplasm of prostate: Secondary | ICD-10-CM | POA: Diagnosis not present

## 2018-08-12 DIAGNOSIS — Z974 Presence of external hearing-aid: Secondary | ICD-10-CM | POA: Diagnosis not present

## 2018-08-12 DIAGNOSIS — Z981 Arthrodesis status: Secondary | ICD-10-CM | POA: Diagnosis not present

## 2018-08-12 DIAGNOSIS — U071 COVID-19: Secondary | ICD-10-CM | POA: Diagnosis present

## 2018-08-12 DIAGNOSIS — Z8249 Family history of ischemic heart disease and other diseases of the circulatory system: Secondary | ICD-10-CM | POA: Diagnosis not present

## 2018-08-12 DIAGNOSIS — Z801 Family history of malignant neoplasm of trachea, bronchus and lung: Secondary | ICD-10-CM | POA: Diagnosis not present

## 2018-08-12 DIAGNOSIS — E611 Iron deficiency: Secondary | ICD-10-CM | POA: Diagnosis present

## 2018-08-12 DIAGNOSIS — Z86010 Personal history of colonic polyps: Secondary | ICD-10-CM | POA: Diagnosis not present

## 2018-08-12 LAB — CBC WITH DIFFERENTIAL/PLATELET
Abs Immature Granulocytes: 0.02 10*3/uL (ref 0.00–0.07)
Basophils Absolute: 0 10*3/uL (ref 0.0–0.1)
Basophils Relative: 1 %
Eosinophils Absolute: 0 10*3/uL (ref 0.0–0.5)
Eosinophils Relative: 1 %
HCT: 35.6 % — ABNORMAL LOW (ref 36.0–46.0)
Hemoglobin: 11.4 g/dL — ABNORMAL LOW (ref 12.0–15.0)
Immature Granulocytes: 1 %
Lymphocytes Relative: 41 %
Lymphs Abs: 1.2 10*3/uL (ref 0.7–4.0)
MCH: 30.2 pg (ref 26.0–34.0)
MCHC: 32 g/dL (ref 30.0–36.0)
MCV: 94.4 fL (ref 80.0–100.0)
Monocytes Absolute: 0.7 10*3/uL (ref 0.1–1.0)
Monocytes Relative: 23 %
Neutro Abs: 1 10*3/uL — ABNORMAL LOW (ref 1.7–7.7)
Neutrophils Relative %: 33 %
Platelets: 209 10*3/uL (ref 150–400)
RBC: 3.77 MIL/uL — ABNORMAL LOW (ref 3.87–5.11)
RDW: 12.1 % (ref 11.5–15.5)
WBC: 3 10*3/uL — ABNORMAL LOW (ref 4.0–10.5)
nRBC: 0 % (ref 0.0–0.2)

## 2018-08-12 LAB — COMPREHENSIVE METABOLIC PANEL
ALT: 34 U/L (ref 0–44)
AST: 38 U/L (ref 15–41)
Albumin: 3.4 g/dL — ABNORMAL LOW (ref 3.5–5.0)
Alkaline Phosphatase: 54 U/L (ref 38–126)
Anion gap: 10 (ref 5–15)
BUN: 21 mg/dL (ref 8–23)
CO2: 30 mmol/L (ref 22–32)
Calcium: 8.9 mg/dL (ref 8.9–10.3)
Chloride: 99 mmol/L (ref 98–111)
Creatinine, Ser: 0.76 mg/dL (ref 0.44–1.00)
GFR calc Af Amer: >60 mL/min (ref >60)
GFR calc non Af Amer: >60 mL/min (ref >60)
Glucose, Bld: 91 mg/dL (ref 70–99)
Potassium: 3.8 mmol/L (ref 3.5–5.1)
Sodium: 139 mmol/L (ref 135–145)
Total Bilirubin: 0.2 mg/dL — ABNORMAL LOW (ref 0.3–1.2)
Total Protein: 5.9 g/dL — ABNORMAL LOW (ref 6.5–8.1)

## 2018-08-12 LAB — C-REACTIVE PROTEIN: CRP: <0.8 mg/dL (ref <1.0)

## 2018-08-12 LAB — BRAIN NATRIURETIC PEPTIDE: B Natriuretic Peptide: 34.6 pg/mL (ref 0.0–100.0)

## 2018-08-12 LAB — LACTATE DEHYDROGENASE: LDH: 163 U/L (ref 98–192)

## 2018-08-12 LAB — FERRITIN: Ferritin: 105 ng/mL (ref 11–307)

## 2018-08-12 LAB — D-DIMER, QUANTITATIVE: D-Dimer, Quant: 0.29 ug/mL-FEU (ref 0.00–0.50)

## 2018-08-12 LAB — MAGNESIUM: Magnesium: 2.2 mg/dL (ref 1.7–2.4)

## 2018-08-12 NOTE — Progress Notes (Addendum)
PROGRESS NOTE                                                                                                                                                                                                             Patient Demographics:    Vicki Garcia, is a 83 y.o. female, DOB - 1935/04/10, LVD:[id number]  Outpatient Primary MD for the patient is Janith Lima, MD    LOS - 2  Admit date - 08/06/2018    Chief Complaint  Patient presents with  . Cough       Brief Narrative   Vicki Garcia  is a 83 y.o. female, w hypertension, dementia with behavioral disburbance, apparently presents with cough, and agitation, and is covid +.  Her facility [facility] will not take her back covid +. Pt is also agitated and they have not sitter at [facility] which is an ALF and therefore pt will require admission due to behavioral disturbance as well.  Pt is unable to provide any history. No complaints of fever, chills, cp, palp, sob, n/v, diarrhea, brbpr, black stool.     Subjective:   Patient in bed, appears comfortable, no headache, no chest pain or SOB.    Assessment  & Plan :     1.  Acute Covid 19 Viral Pneumonitis during the ongoing 2020 Covid 19 Pandemic -  Stable for now on room air, inflammatory markers relatively stable, continue to monitor her.  She will require placement.   Per family request  second COVID test was ordered on 08/12/2018 but highly unlikely to be negative.  Test usually stays positive for 6 to 8    ABG     Component Value Date/Time   HCO3 25.7 04/26/2017 2039   TCO2 27 04/26/2017 2039   O2SAT 57.0 04/26/2017 2039    COVID-19 Labs  Recent Labs    08/11/18 0210 08/11/18 0215 08/12/18 0350  DDIMER  --  <0.27 0.29  FERRITIN 102  --  105  LDH  --  161 163  CRP <0.8  --  <0.8    Lab Results  Component Value Date   SARSCOV2NAA POSITIVE (A) 08/06/2018   [place] Not Detected 07/06/2018      Hepatic Function Latest Ref Rng & Units 08/12/2018 08/11/2018 08/09/2018  Total Protein 6.5 - 8.1 g/dL 5.9(L) 5.8(L) 7.1  Albumin  3.5 - 5.0 g/dL 3.4(L) 3.5 4.0  AST 15 - 41 U/L 38 34 47(H)  ALT 0 - 44 U/L 34 31 31  Alk Phosphatase 38 - 126 U/L 54 50 61  Total Bilirubin 0.3 - 1.2 mg/dL 0.2(L) 0.2(L) 0.5  Bilirubin, Direct 0.0 - 0.3 mg/dL - - -        Component Value Date/Time   BNP 17.5 08/11/2018 0210      2.  Advanced dementia.  Does have some hospital-acquired delirium, encephalopathy which is nonspecific and likely due to being in the unfamiliar setting.  PRN IV Haldol, will add nighttime Seroquel at the lowest possible dose, fall precautions, remote camera sitter.  Will also involve PT OT, advance activity exposed to sunlight to see if it helps.  Speech therapy also to assist.  For now soft diet with feeding assistance and aspiration precautions.  Baseline quality of life appears to be quite poor.  Note patient is still requiring assistance to be fed and remains at risk for fall due to delirium.  3.  Hypertension.  Blood pressure improved will add low-dose Norvasc add PRN hydralazine IV.  4.  History of iron deficiency anemia.  Continue oral iron supplements.      Condition - Extremely Guarded  Family Communication  :  Daughter on 08/10/18 x 2, explained making her mom DNR, explained her 2nd time, she agrees.  Updated again on 08/12/2018.  Also told that second COVID test was ordered on 08/12/2018 but highly unlikely to be negative.  Test usually stays positive for 6 to 8 weeks.  Code Status :  DNR  Diet :    Diet Order            DIET SOFT Room service appropriate? Yes; Fluid consistency: Thin  Diet effective now               Disposition Plan  :  SNF  Consults  :  None  Procedures  :  None  PUD Prophylaxis :    DVT Prophylaxis  :   Heparin    Lab Results  Component Value Date   PLT 209 08/12/2018    Inpatient Medications  Scheduled Meds: .  acetaminophen  1,000 mg Oral QHS  . benzonatate  100 mg Oral TID  . citalopram  10 mg Oral Daily  . donepezil  5 mg Oral QHS  . famotidine  40 mg Oral Daily  . feeding supplement (ENSURE ENLIVE)  237 mL Oral BID BM  . ferrous sulfate  325 mg Oral Q breakfast  . heparin injection (subcutaneous)  5,000 Units Subcutaneous Q8H  . latanoprost  1 drop Both Eyes QHS  . Melatonin  3 mg Oral QHS  . QUEtiapine  50 mg Oral BID  . vitamin C  500 mg Oral Daily  . zinc sulfate  220 mg Oral Daily   Continuous Infusions: PRN Meds:.acetaminophen, albuterol, haloperidol lactate, hydrALAZINE  Antibiotics  :    Anti-infectives (From admission, onward)   None       Time Spent in minutes  30   Lala Lund M.D on 08/12/2018 at 9:02 AM  To page go to [email or web address] - password [facility]  [facility] -  Office  [phone]  See all Orders from today for further details    Objective:   Vitals:   08/11/18 1636 08/11/18 2029 08/12/18 0540 08/12/18 0812  BP: (!) 164/78 (!) 158/62 114/67 (!) 128/58  Pulse: 87 77 (!) 55 (!) 57  Resp:  16  Temp: 97.6 F (36.4 C) 99.9 F (37.7 C) 98.5 F (36.9 C) 98.6 F (37 C)  TempSrc: Oral Oral Oral Oral  SpO2: 98% 100% 98% 97%  Weight:      Height:        Wt Readings from Last 3 Encounters:  08/07/18 45.8 kg  07/06/18 44.9 kg  03/14/18 44.9 kg     Intake/Output Summary (Last 24 hours) at 08/12/2018 0902 Last data filed at 08/11/2018 2047 Gross per 24 hour  Intake 333 ml  Output -  Net 333 ml     Physical Exam  Awake but remains confused, No new F.N deficits,   [place].AT,PERRAL Supple Neck,No JVD, No cervical lymphadenopathy appriciated.  Symmetrical Chest wall movement, Good air movement bilaterally, CTAB RRR,No Gallops, Rubs or new Murmurs, No Parasternal Heave +ve B.Sounds, Abd Soft, No tenderness, No organomegaly appriciated, No rebound - guarding or rigidity. No Cyanosis, Clubbing or edema, No new Rash or bruise    Data Review:     CBC  Recent Labs  Lab 08/07/18 0511 08/08/18 0505 08/09/18 1005 08/11/18 0215 08/12/18 0350  WBC 4.4 2.6* 5.5 3.2* 3.0*  HGB 11.4* 11.7* 14.0 11.3* 11.4*  HCT 34.0* 36.8 41.5 34.9* 35.6*  PLT 210 199 280 209 209  MCV 92.6 93.9 91.4 93.1 94.4  MCH 31.1 29.8 30.8 30.1 30.2  MCHC 33.5 31.8 33.7 32.4 32.0  RDW 12.1 11.9 11.9 12.0 12.1  LYMPHSABS 0.4* 0.4* 1.4 1.2 1.2  MONOABS 0.7 0.2 1.0 0.8 0.7  EOSABS 0.1 0.0 0.0 0.0 0.0  BASOSABS 0.0 0.0 0.0 0.0 0.0    Chemistries    Recent Labs  Lab 08/07/18 0511 08/08/18 0505 08/09/18 1005 08/11/18 0215 08/12/18 0350  NA 133* 132* 134* 137 139  K 3.8 3.9 3.5 3.7 3.8  CL 96* 95* 93* 97* 99  CO2 26 25 29 28 30   GLUCOSE 100* 137* 89 86 91  BUN 14 16 33* 24* 21  CREATININE 0.64 0.76 0.91 0.78 0.76  CALCIUM 9.2 9.0 9.7 9.0 8.9  MG  --   --   --  2.1 2.2  AST 21 30 47* 34 38  ALT 19 21 31 31  34  ALKPHOS 62 56 61 50 54  BILITOT 0.8 0.4 0.5 0.2* 0.2*   ------------------------------------------------------------------------------------------------------------------ No results for input(s): CHOL, HDL, LDLCALC, TRIG, CHOLHDL, LDLDIRECT in the last 72 hours.  Lab Results  Component Value Date   HGBA1C 5.4 04/27/2017   ------------------------------------------------------------------------------------------------------------------ No results for input(s): TSH, T4TOTAL, T3FREE, THYROIDAB in the last 72 hours.  Invalid input(s): FREET3  Cardiac Enzymes Recent Labs  Lab 08/07/18 0511  CKMB 5.0   ------------------------------------------------------------------------------------------------------------------    Component Value Date/Time   BNP 17.5 08/11/2018 0210    Micro Results Recent Results (from the past 240 hour(s))  SARS Coronavirus 2 (CEPHEID- Performed in [place] hospital lab), Hosp Order     Status: Abnormal   Collection Time: 08/06/18  9:41 PM   Specimen: Nasopharyngeal Swab  Result Value Ref Range  Status   SARS Coronavirus 2 POSITIVE (A) NEGATIVE Final    Comment: RESULT CALLED TO, READ BACK BY AND VERIFIED WITH: BEAULAURIER AT 007 ON 08/07/2018 BY JPM (NOTE) If result is NEGATIVE SARS-CoV-2 target nucleic acids are NOT DETECTED. The SARS-CoV-2 RNA is generally detectable in upper and lower  respiratory specimens during the acute phase of infection. The lowest  concentration of SARS-CoV-2 viral copies this assay can detect is 250  copies / mL. A  negative result does not preclude SARS-CoV-2 infection  and should not be used as the sole basis for treatment or other  patient management decisions.  A negative result may occur with  improper specimen collection / handling, submission of specimen other  than nasopharyngeal swab, presence of viral mutation(s) within the  areas targeted by this assay, and inadequate number of viral copies  (<250 copies / mL). A negative result must be combined with clinical  observations, patient history, and epidemiological information. If result is POSITIVE SARS-CoV-2 target nucleic acids are DETECTED.  The SARS-CoV-2 RNA is generally detectable in upper and lower  respiratory specimens during the acute phase of infection.  Positive  results are indicative of active infection with SARS-CoV-2.  Clinical  correlation with patient history and other diagnostic information is  necessary to determine patient infection status.  Positive results do  not rule out bacterial infection or co-infection with other viruses. If result is PRESUMPTIVE POSTIVE SARS-CoV-2 nucleic acids MAY BE PRESENT.   A presumptive positive result was obtained on the submitted specimen  and confirmed on repeat testing.  While 2019 novel coronavirus  (SARS-CoV-2) nucleic acids may be present in the submitted sample  additional confirmatory testing may be necessary for epidemiological  and / or clinical management purposes  to differentiate between  SARS-CoV-2 and other Sarbecovirus  currently known to infect humans.  If clinically indicated additional testing with an alternate test  methodology [phone])  is advised. The SARS-CoV-2 RNA is generally  detectable in upper and lower respiratory specimens during the acute  phase of infection. The expected result is Negative. Fact Sheet for Patients:  StrictlyIdeas.no Fact Sheet for Healthcare Providers: BankingDealers.co.za This test is not yet approved or cleared by the [place] FDA and has been authorized for detection and/or diagnosis of SARS-CoV-2 by FDA under an Emergency Use Authorization (EUA).  This EUA will remain in effect (meaning this test can be used) for the duration of the COVID-19 declaration under Section 564(b)(1) of the Act, 21 U.S.C. section 360bbb-3(b)(1), unless the authorization is terminated or revoked sooner. Performed at [facility], [place] [address] [place], [place] [address]   MRSA PCR Screening     Status: None   Collection Time: 08/07/18 11:53 AM   Specimen: Nasal Mucosa; Nasopharyngeal  Result Value Ref Range Status   MRSA by PCR NEGATIVE NEGATIVE Final    Comment:        The GeneXpert MRSA Assay (FDA approved for NASAL specimens only), is one component of a comprehensive MRSA colonization surveillance program. It is not intended to diagnose MRSA infection nor to guide or monitor treatment for MRSA infections. Performed at [facility], [place] [address], [place], [place] [address]     Radiology Reports Dg Chest Portable 1 View  Result Date: 08/06/2018 CLINICAL DATA:  83 year old female with cough. EXAM: PORTABLE CHEST 1 VIEW COMPARISON:  Chest radiograph dated 12/13/2017 FINDINGS: The patient is tilted to the left. Minimal left lung base atelectatic changes. No focal consolidation, or pleural effusion. Thin lucency along the upper mediastinum to the left of midline, likely artifact. A  pneumothorax is less likely. The cardiac silhouette is within normal limits. No acute osseous pathology. IMPRESSION: No active disease. Electronically Signed   By: Anner Crete M.D.   On: 08/06/2018 21:27

## 2018-08-12 NOTE — Progress Notes (Signed)
CSW acknowledges SNF rec however patient has no bed offers at this time.   CSW has reached out to [facility] for reasons for declining bed offer, they report due to behaviors at this time they are unable to accept patient and meet her needs.   [place], [place]

## 2018-08-12 NOTE — TOC Initial Note (Signed)
Transition of Care [facility]) - Initial/Assessment Note    Patient Details  Name: Vicki Garcia MRN: [id number] Date of Birth: 19-Jan-1936  Transition of Care [facility]) CM/SW Contact:    Alberteen Sam, LCSW Phone Number: 08/12/2018, 12:20 PM  Clinical Narrative:                  CSW consulted with patient's daughter Langley Gauss about PT's recommendation of SNF for patient at discharge. Langley Gauss does not approve of this discharge plan. She reports patient had no behaviors when she was at [facility] and would like patient to return. She reports if patient went to a different facility her mood would be worse and she would be worried about her behaviors. Langley Gauss reports they currently have a walker but are open to getting more equipment and whatever they need for patient to return to ALF, as they are refusing SNF rec at this time. Patient also has no SNF bed offers at this time due to behaviors.   Langley Gauss reports plan to continue to await patient's negative COVID test before returning to [facility].   Expected Discharge Plan: Assisted Living Barriers to Discharge: Continued Medical Work up   Patient Goals and CMS Choice   CMS Medicare.gov Compare Post Acute Care list provided to:: Patient Represenative (must comment)(Daughter) Choice offered to / list presented to : Adult Children(Denise (daughter))  Expected Discharge Plan and Services Expected Discharge Plan: Assisted Living     Post Acute Care Choice: ([facility]) Living arrangements for the past 2 months: [place]                                      Prior Living Arrangements/Services Living arrangements for the past 2 months: [place] Lives with:: Self Patient language and need for interpreter reviewed:: Yes Do you feel safe going back to the place where you live?: Yes      Need for Family Participation in Patient Care: Yes (Comment) Care giver support system in place?: Yes (comment)    Criminal Activity/Legal Involvement Pertinent to Current Situation/Hospitalization: No - Comment as needed  Activities of Daily Living Home Assistive Devices/Equipment: None ADL Screening (condition at time of admission) Patient's cognitive ability adequate to safely complete daily activities?: Yes Is the patient deaf or have difficulty hearing?: No Does the patient have difficulty seeing, even when wearing glasses/contacts?: No Does the patient have difficulty concentrating, remembering, or making decisions?: Yes Patient able to express need for assistance with ADLs?: Yes Does the patient have difficulty dressing or bathing?: Yes Independently performs ADLs?: No Communication: Independent Dressing (OT): Needs assistance Is this a change from baseline?: Pre-admission baseline Grooming: Needs assistance Is this a change from baseline?: Pre-admission baseline Feeding: Independent Bathing: Needs assistance Is this a change from baseline?: Pre-admission baseline Toileting: Independent In/Out Bed: Independent Walks in Home: Needs assistance Is this a change from baseline?: Pre-admission baseline Does the patient have difficulty walking or climbing stairs?: Yes Weakness of Legs: None Weakness of Arms/Hands: None  Permission Sought/Granted Permission sought to share information with : Case Manager, Customer service manager, Family Supports Permission granted to share information with : Yes, Verbal Permission Granted  Share Information with NAME: Langley Gauss  Permission granted to share info w AGENCY: ALF  Permission granted to share info w Relationship: daughter  Permission granted to share info w Contact Information: [phone]  Emotional Assessment Appearance:: Other (Comment Required(unable  to assess- remote) Attitude/Demeanor/Rapport: Unable to Assess Affect (typically observed): Unable to Assess Orientation: : Oriented to Self Alcohol / Substance Use: Not  Applicable Psych Involvement: No (comment)  Admission diagnosis:  Viral URI with cough [J06.9, B97.89] COVID-19 virus detected [U07.1] Acute respiratory disease due to COVID-19 virus [U07.1, J06.9] Patient Active Problem List   Diagnosis Date Noted  . Coronavirus infection 08/09/2018  . COVID-19 virus infection 08/07/2018  . Acute respiratory disease due to COVID-19 virus 08/07/2018  . Routine general medical examination at a health care facility 03/14/2018  . Iron deficiency anemia due to dietary causes 05/05/2017  . Chronic cough 03/06/2016  . Senile dementia with behavioral disturbance ([place] ) 10/24/2014  . Benign esophageal stricture 07/30/2014  . Seasonal and perennial allergic rhinitis 10/29/2008  . MELANOMA, UPPER ARM 04/04/2008  . Essential hypertension 02/22/2007  . Hyperlipidemia LDL goal <160 02/18/2007   PCP:  Janith Lima, MD Pharmacy:   [facility], [place] [address] Phone: [phone] Fax: [phone]     Social Determinants of Health (SDOH) Interventions    Readmission Risk Interventions No flowsheet data found.

## 2018-08-12 NOTE — Progress Notes (Addendum)
Physical Therapy Treatment Patient Details Name: Vicki Garcia MRN: [id number] DOB: December 23, 1935 Today's Date: 08/12/2018    History of Present Illness 83 year old female with dementia, hypertension. lt rotator cuff disorder, tinnitus, hearing loss with bil hearing aids presented to ED from [facility] with persistent coughing for 1 day and agitation.  Patient was found to be COVID positive.     PT Comments    Patient pleasantly confused and able to follow all instructions to participate with walking, balance challenges and standing exercises.    Follow Up Recommendations  SNF; Supervision/Assistance - 24 hour (pt improved, could likely return to ALF however must have negative test result)     Equipment Recommendations  None recommended by PT    Recommendations for Other Services OT consult     Precautions / Restrictions Precautions Precautions: Fall Restrictions Weight Bearing Restrictions: No    Mobility  Bed Mobility Overal bed mobility: Modified Independent             General bed mobility comments: supine to sit and then sit to supine; incr time and effort  Transfers Overall transfer level: Needs assistance Equipment used: None Transfers: Sit to/from Stand Sit to Stand: Min guard         General transfer comment: minguard for safety due to grogginess; no imbalance  Ambulation/Gait Ambulation/Gait assistance: Min guard Gait Distance (Feet): 500 Feet Assistive device: None Gait Pattern/deviations: Step-through pattern;Decreased stride length;Narrow base of support Gait velocity: decreased   General Gait Details: today able to maintain conversation while walking; unable to vary speed up or down; did follow instructions for turning head to look left and right at signs/objects with slight drift left or right   Stairs             Wheelchair Mobility    Modified Rankin (Stroke Patients Only)       Balance Overall balance assessment: Needs  assistance         Standing balance support: No upper extremity supported;During functional activity Standing balance-Leahy Scale: Fair Standing balance comment: braced her hips against cabinet when washing her hands; stood at EOB without assist prior to walking                   [employer] Unsupported, Alternately Place Feet on Step/Stool: Needs assistance to keep from falling or unable to try Standing Unsupported, One Foot in Front: Loses balance while stepping or standing Standing on One Leg: Unable to try or needs assist to prevent fall        Cognition Arousal/Alertness: Awake/alert Behavior During Therapy: WFL for tasks assessed/performed Overall Cognitive Status: No family/caregiver present to determine baseline cognitive functioning                                 General Comments: Baseline dementia; has been getting meds that make her slightly groggy      Exercises General Exercises - Lower Extremity Hip ABduction/ADduction: Strengthening;Both;20 reps(alternating legs, bil UE support) Heel Raises: Strengthening;Both;10 reps;Standing(bil UE support) Mini-Sqauts: Strengthening;Both;10 reps;Standing(pt did FULL squats with bil UE support)    General Comments        Pertinent Vitals/Pain Pain Assessment: No/denies pain    Home Living                      Prior Function            PT Goals (current  goals can now be found in the care plan section) Acute Rehab PT Goals Patient Stated Goal: "I like music. Good music. Classical music." Time For Goal Achievement: 08/23/18 Potential to Achieve Goals: Good Progress towards PT goals: Progressing toward goals    Frequency    Min 2X/week      PT Plan Other (comment)    Co-evaluation              AM-PAC PT "6 Clicks" Mobility   Outcome Measure  Help needed turning from your back to your side while in a flat bed without using bedrails?: None Help needed  moving from lying on your back to sitting on the side of a flat bed without using bedrails?: None Help needed moving to and from a bed to a chair (including a wheelchair)?: A Little Help needed standing up from a chair using your arms (e.g., wheelchair or bedside chair)?: A Little Help needed to walk in hospital room?: A Little Help needed climbing 3-5 steps with a railing? : A Little 6 Click Score: 20    End of Session Equipment Utilized During Treatment: Gait belt Activity Tolerance: Patient tolerated treatment well Patient left: with nursing/sitter in room;in bed Nurse Communication: Mobility status PT Visit Diagnosis: Unsteadiness on feet (R26.81);Muscle weakness (generalized) (M62.81)     Time: 4388-8757 PT Time Calculation (min) (ACUTE ONLY): 28 min  Charges:  $Gait Training: 8-22 mins $Therapeutic Exercise: 8-22 mins                        [employer], PT 08/12/2018, 4:39 PM

## 2018-08-13 LAB — COMPREHENSIVE METABOLIC PANEL
ALT: 39 U/L (ref 0–44)
AST: 39 U/L (ref 15–41)
Albumin: 3.3 g/dL — ABNORMAL LOW (ref 3.5–5.0)
Alkaline Phosphatase: 56 U/L (ref 38–126)
Anion gap: 10 (ref 5–15)
BUN: 23 mg/dL (ref 8–23)
CO2: 29 mmol/L (ref 22–32)
Calcium: 8.7 mg/dL — ABNORMAL LOW (ref 8.9–10.3)
Chloride: 99 mmol/L (ref 98–111)
Creatinine, Ser: 0.61 mg/dL (ref 0.44–1.00)
GFR calc Af Amer: >60 mL/min (ref >60)
GFR calc non Af Amer: >60 mL/min (ref >60)
Glucose, Bld: 100 mg/dL — ABNORMAL HIGH (ref 70–99)
Potassium: 3.7 mmol/L (ref 3.5–5.1)
Sodium: 138 mmol/L (ref 135–145)
Total Bilirubin: 0.3 mg/dL (ref 0.3–1.2)
Total Protein: 5.9 g/dL — ABNORMAL LOW (ref 6.5–8.1)

## 2018-08-13 LAB — CBC WITH DIFFERENTIAL/PLATELET
Abs Immature Granulocytes: 0.04 10*3/uL (ref 0.00–0.07)
Basophils Absolute: 0 10*3/uL (ref 0.0–0.1)
Basophils Relative: 1 %
Eosinophils Absolute: 0.1 10*3/uL (ref 0.0–0.5)
Eosinophils Relative: 2 %
HCT: 35.2 % — ABNORMAL LOW (ref 36.0–46.0)
Hemoglobin: 11.5 g/dL — ABNORMAL LOW (ref 12.0–15.0)
Immature Granulocytes: 1 %
Lymphocytes Relative: 38 %
Lymphs Abs: 1.2 10*3/uL (ref 0.7–4.0)
MCH: 30.3 pg (ref 26.0–34.0)
MCHC: 32.7 g/dL (ref 30.0–36.0)
MCV: 92.9 fL (ref 80.0–100.0)
Monocytes Absolute: 0.6 10*3/uL (ref 0.1–1.0)
Monocytes Relative: 17 %
Neutro Abs: 1.3 10*3/uL — ABNORMAL LOW (ref 1.7–7.7)
Neutrophils Relative %: 41 %
Platelets: 214 10*3/uL (ref 150–400)
RBC: 3.79 MIL/uL — ABNORMAL LOW (ref 3.87–5.11)
RDW: 12 % (ref 11.5–15.5)
WBC: 3.2 10*3/uL — ABNORMAL LOW (ref 4.0–10.5)
nRBC: 0 % (ref 0.0–0.2)

## 2018-08-13 LAB — MAGNESIUM: Magnesium: 2 mg/dL (ref 1.7–2.4)

## 2018-08-13 LAB — NOVEL CORONAVIRUS, NAA (HOSP ORDER, SEND-OUT TO REF LAB; TAT 18-24 HRS): SARS-CoV-2, NAA: DETECTED — AB

## 2018-08-13 LAB — BRAIN NATRIURETIC PEPTIDE: B Natriuretic Peptide: 31.1 pg/mL (ref 0.0–100.0)

## 2018-08-13 LAB — FERRITIN: Ferritin: 104 ng/mL (ref 11–307)

## 2018-08-13 LAB — C-REACTIVE PROTEIN: CRP: <0.8 mg/dL (ref <1.0)

## 2018-08-13 LAB — LACTATE DEHYDROGENASE: LDH: 150 U/L (ref 98–192)

## 2018-08-13 LAB — D-DIMER, QUANTITATIVE: D-Dimer, Quant: 0.27 ug/mL-FEU (ref 0.00–0.50)

## 2018-08-13 NOTE — Progress Notes (Signed)
PROGRESS NOTE                                                                                                                                                                                                             Patient Demographics:    Vicki Garcia, is a 83 y.o. female, DOB - [birth year], VZD:[id number]  Outpatient Primary MD for the patient is Janith Lima, MD    LOS - 3  Admit date - 08/06/2018    Chief Complaint  Patient presents with  . Cough       Brief Narrative   Vicki Garcia  is a 83 y.o. female, w hypertension, dementia with behavioral disburbance, apparently presents with cough, and agitation, and is covid +.  Her facility [facility] will not take her back covid +. Pt is also agitated and they have not sitter at [facility] which is an ALF and therefore pt will require admission due to behavioral disturbance as well.  Pt is unable to provide any history. No complaints of fever, chills, cp, palp, sob, n/v, diarrhea, brbpr, black stool.     Subjective:   Patient in bed, appears comfortable, denies any aches or pains, is calm.   Assessment  & Plan :     1.  Acute Covid 19 Viral Pneumonitis during the ongoing 2020 Covid 19 Pandemic -  Stable for now on room air, inflammatory markers relatively stable, continue to monitor her.  She will require placement.   Per family request  second COVID test was ordered on 08/12/2018 but highly unlikely to be negative.  Test usually stays positive for 6 to 8    COVID-19 Labs  Recent Labs    08/11/18 0210 08/11/18 0215 08/12/18 0350 08/13/18 0330  DDIMER  --  <0.27 0.29 0.27  FERRITIN 102  --  105 104  LDH  --  161 163 150  CRP <0.8  --  <0.8 <0.8    Lab Results  Component Value Date   SARSCOV2NAA POSITIVE (A) 08/06/2018   [place] Not Detected 07/06/2018     Hepatic Function Latest Ref Rng & Units 08/13/2018 08/12/2018 08/11/2018  Total Protein 6.5 -  8.1 g/dL 5.9(L) 5.9(L) 5.8(L)  Albumin 3.5 - 5.0 g/dL 3.3(L) 3.4(L) 3.5  AST 15 - 41 U/L 39 38 34  ALT 0 - 44 U/L 39 34  31  Alk Phosphatase 38 - 126 U/L 56 54 50  Total Bilirubin 0.3 - 1.2 mg/dL 0.3 0.2(L) 0.2(L)  Bilirubin, Direct 0.0 - 0.3 mg/dL - - -        Component Value Date/Time   BNP 31.1 08/13/2018 0330      2.  Advanced dementia.  Does have some hospital-acquired delirium, encephalopathy which is nonspecific and likely due to being in the unfamiliar setting.  PRN IV Haldol, will add nighttime Seroquel at the lowest possible dose, fall precautions, remote camera sitter.  Will also involve PT OT, advance activity exposed to sunlight to see if it helps.  Speech therapy also to assist.  For now soft diet with feeding assistance and aspiration precautions.  Baseline quality of life appears to be quite poor.  Note patient is still requiring assistance to be fed and remains at risk for fall due to delirium and baseline dementia.  3.  Hypertension.  Stable on Norvasc and PRN IV Hydralazine.  4.  History of iron deficiency anemia.  Continue oral iron supplements.      Condition - Fair  Family Communication  :  Daughter on 08/10/18 x 2, explained making her mom DNR, explained her 2nd time, she agrees.  Updated again on 08/12/2018.  Also told that second COVID test was ordered on 08/12/2018 but highly unlikely to be negative.  Test usually stays positive for 6 to 8 weeks.  Code Status :  DNR  Diet :    Diet Order            DIET SOFT Room service appropriate? Yes; Fluid consistency: Thin  Diet effective now               Disposition Plan  :  SNF  Consults  :  None  Procedures  :  None  PUD Prophylaxis :    DVT Prophylaxis  :   Heparin    Lab Results  Component Value Date   PLT 214 08/13/2018    Inpatient Medications  Scheduled Meds: . acetaminophen  1,000 mg Oral QHS  . benzonatate  100 mg Oral TID  . citalopram  10 mg Oral Daily  . donepezil  5 mg Oral QHS   . famotidine  40 mg Oral Daily  . feeding supplement (ENSURE ENLIVE)  237 mL Oral BID BM  . ferrous sulfate  325 mg Oral Q breakfast  . heparin injection (subcutaneous)  5,000 Units Subcutaneous Q8H  . latanoprost  1 drop Both Eyes QHS  . Melatonin  3 mg Oral QHS  . QUEtiapine  50 mg Oral BID  . vitamin C  500 mg Oral Daily  . zinc sulfate  220 mg Oral Daily   Continuous Infusions: PRN Meds:.acetaminophen, albuterol, haloperidol lactate, hydrALAZINE  Antibiotics  :    Anti-infectives (From admission, onward)   None       Time Spent in minutes  30   Lala Lund M.D on 08/13/2018 at 9:19 AM  To page go to [email or web address] - password [facility]  [facility] -  Office  [phone]  See all Orders from today for further details    Objective:   Vitals:   08/12/18 0812 08/12/18 1635 08/12/18 2024 08/13/18 0403  BP: (!) 128/58 (!) 122/47 (!) 154/50 124/67  Pulse: (!) 57 72 92 90  Resp: 16 18    Temp: 98.6 F (37 C) 98.8 F (37.1 C) 99.3 F (37.4 C) 98.7 F (37.1 C)  TempSrc: Oral Oral Oral  Oral  SpO2: 97% 100% 97% 98%  Weight:      Height:        Wt Readings from Last 3 Encounters:  08/07/18 45.8 kg  07/06/18 44.9 kg  03/14/18 44.9 kg     Intake/Output Summary (Last 24 hours) at 08/13/2018 0919 Last data filed at 08/12/2018 1815 Gross per 24 hour  Intake 720 ml  Output -  Net 720 ml     Physical Exam  Awake but remains confused - her baseline, No new F.N deficits, remains calm [place].AT,PERRAL Supple Neck,No JVD, No cervical lymphadenopathy appriciated.  Symmetrical Chest wall movement, Good air movement bilaterally, CTAB RRR,No Gallops, Rubs or new Murmurs, No Parasternal Heave +ve B.Sounds, Abd Soft, No tenderness, No organomegaly appriciated, No rebound - guarding or rigidity. No Cyanosis, Clubbing or edema, No new Rash or bruise    Data Review:    CBC  Recent Labs  Lab 08/08/18 0505 08/09/18 1005 08/11/18 0215 08/12/18 0350 08/13/18  0330  WBC 2.6* 5.5 3.2* 3.0* 3.2*  HGB 11.7* 14.0 11.3* 11.4* 11.5*  HCT 36.8 41.5 34.9* 35.6* 35.2*  PLT 199 280 209 209 214  MCV 93.9 91.4 93.1 94.4 92.9  MCH 29.8 30.8 30.1 30.2 30.3  MCHC 31.8 33.7 32.4 32.0 32.7  RDW 11.9 11.9 12.0 12.1 12.0  LYMPHSABS 0.4* 1.4 1.2 1.2 1.2  MONOABS 0.2 1.0 0.8 0.7 0.6  EOSABS 0.0 0.0 0.0 0.0 0.1  BASOSABS 0.0 0.0 0.0 0.0 0.0    Chemistries    Recent Labs  Lab 08/08/18 0505 08/09/18 1005 08/11/18 0215 08/12/18 0350 08/13/18 0330  NA 132* 134* 137 139 138  K 3.9 3.5 3.7 3.8 3.7  CL 95* 93* 97* 99 99  CO2 _0 GLUCOSE 137* 89 86 91 100*  BUN 16 33* 24* 21 23  CREATININE 0.76 0.91 0.78 0.76 0.61  CALCIUM 9.0 9.7 9.0 8.9 8.7*  MG  --   --  2.1 2.2 2.0  AST 30 47* 34 38 39  ALT _1 34 39  ALKPHOS 56 61 50 54 56  BILITOT 0.4 0.5 0.2* 0.2* 0.3   ------------------------------------------------------------------------------------------------------------------ No results for input(s): CHOL, HDL, LDLCALC, TRIG, CHOLHDL, LDLDIRECT in the last 72 hours.  Lab Results  Component Value Date   HGBA1C 5.4 04/27/2017   ------------------------------------------------------------------------------------------------------------------ No results for input(s): TSH, T4TOTAL, T3FREE, THYROIDAB in the last 72 hours.  Invalid input(s): FREET3  Cardiac Enzymes Recent Labs  Lab 08/07/18 0511  CKMB 5.0   ------------------------------------------------------------------------------------------------------------------    Component Value Date/Time   BNP 31.1 08/13/2018 0330    Micro Results Recent Results (from the past 240 hour(s))  SARS Coronavirus 2 (CEPHEID- Performed in [facility] hospital lab), Hosp Order     Status: Abnormal   Collection Time: 08/06/18  9:41 PM   Specimen: Nasopharyngeal Swab  Result Value Ref Range Status   SARS Coronavirus 2 POSITIVE (A) NEGATIVE Final    Comment: RESULT CALLED TO, READ BACK BY AND  VERIFIED WITH: BEAULAURIER AT 007 ON 08/07/2018 BY JPM (NOTE) If result is NEGATIVE SARS-CoV-2 target nucleic acids are NOT DETECTED. The SARS-CoV-2 RNA is generally detectable in upper and lower  respiratory specimens during the acute phase of infection. The lowest  concentration of SARS-CoV-2 viral copies this assay can detect is 250  copies / mL. A negative result does not preclude SARS-CoV-2 infection  and should not be used as the sole basis for treatment or other  patient  management decisions.  A negative result may occur with  improper specimen collection / handling, submission of specimen other  than nasopharyngeal swab, presence of viral mutation(s) within the  areas targeted by this assay, and inadequate number of viral copies  (<250 copies / mL). A negative result must be combined with clinical  observations, patient history, and epidemiological information. If result is POSITIVE SARS-CoV-2 target nucleic acids are DETECTED.  The SARS-CoV-2 RNA is generally detectable in upper and lower  respiratory specimens during the acute phase of infection.  Positive  results are indicative of active infection with SARS-CoV-2.  Clinical  correlation with patient history and other diagnostic information is  necessary to determine patient infection status.  Positive results do  not rule out bacterial infection or co-infection with other viruses. If result is PRESUMPTIVE POSTIVE SARS-CoV-2 nucleic acids MAY BE PRESENT.   A presumptive positive result was obtained on the submitted specimen  and confirmed on repeat testing.  While 2019 novel coronavirus  (SARS-CoV-2) nucleic acids may be present in the submitted sample  additional confirmatory testing may be necessary for epidemiological  and / or clinical management purposes  to differentiate between  SARS-CoV-2 and other Sarbecovirus currently known to infect humans.  If clinically indicated additional testing with an alternate test   methodology [phone])  is advised. The SARS-CoV-2 RNA is generally  detectable in upper and lower respiratory specimens during the acute  phase of infection. The expected result is Negative. Fact Sheet for Patients:  StrictlyIdeas.no Fact Sheet for Healthcare Providers: BankingDealers.co.za This test is not yet approved or cleared by the [place] FDA and has been authorized for detection and/or diagnosis of SARS-CoV-2 by FDA under an Emergency Use Authorization (EUA).  This EUA will remain in effect (meaning this test can be used) for the duration of the COVID-19 declaration under Section 564(b)(1) of the Act, 21 U.S.C. section 360bbb-3(b)(1), unless the authorization is terminated or revoked sooner. Performed at [facility], [place] [address], [place], [place] [address]   MRSA PCR Screening     Status: None   Collection Time: 08/07/18 11:53 AM   Specimen: Nasal Mucosa; Nasopharyngeal  Result Value Ref Range Status   MRSA by PCR NEGATIVE NEGATIVE Final    Comment:        The GeneXpert MRSA Assay (FDA approved for NASAL specimens only), is one component of a comprehensive MRSA colonization surveillance program. It is not intended to diagnose MRSA infection nor to guide or monitor treatment for MRSA infections. Performed at [facility], [place] [address], [place], [place] [address]     Radiology Reports Dg Chest Portable 1 View  Result Date: 08/06/2018 CLINICAL DATA:  83 year old female with cough. EXAM: PORTABLE CHEST 1 VIEW COMPARISON:  Chest radiograph dated 12/13/2017 FINDINGS: The patient is tilted to the left. Minimal left lung base atelectatic changes. No focal consolidation, or pleural effusion. Thin lucency along the upper mediastinum to the left of midline, likely artifact. A pneumothorax is less likely. The cardiac silhouette is within normal limits. No acute osseous pathology.  IMPRESSION: No active disease. Electronically Signed   By: Anner Crete M.D.   On: 08/06/2018 21:27

## 2018-08-13 NOTE — Progress Notes (Signed)
Spoke with daughter Langley Gauss.  Answered all questions and concerns.

## 2018-08-13 NOTE — Progress Notes (Signed)
Safety sitter discontinued around 1300 secondary to patient requiring SNF placement, thus needs to be without sitter attendance for 24 hours prior discharging. Remains demented and delirious at baseline; wandering in the room and out of room up to Nursing station. Easily reoriented and redirected back to room.  Estill Cotta, BSN, RN

## 2018-08-14 MED ORDER — HALOPERIDOL LACTATE 5 MG/ML IJ SOLN
5.0000 mg | Freq: Once | INTRAMUSCULAR | Status: AC
  Administered 2018-08-14: 5 mg via INTRAMUSCULAR

## 2018-08-14 NOTE — Progress Notes (Addendum)
Patient very agitated, restless, and yelling this afternoon. Had previously pulled out PIV. Dr. Candiss Norse notified and gave a verbal for 5mg  IM haldol.   1735: Patient continued to be restless all afternoon and continued to get OOB. Redirectable at this time. Had very poor PO intake today.

## 2018-08-14 NOTE — Progress Notes (Signed)
PROGRESS NOTE                                                                                                                                                                                                             Patient Demographics:    Vicki Garcia, is a 83 y.o. female, DOB - 08-09-1935, NBZ:[id number]  Outpatient Primary MD for the patient is Janith Lima, MD    LOS - 4  Admit date - 08/06/2018    Chief Complaint  Patient presents with  . Cough       Brief Narrative   Vicki Garcia  is a 83 y.o. female, w hypertension, dementia with behavioral disburbance, apparently presents with cough, and agitation, and is covid +.  Her facility [facility] will not take her back covid +. Pt is also agitated and they have not sitter at [facility] which is an ALF and therefore pt will require admission due to behavioral disturbance as well.  Pt is unable to provide any history. No complaints of fever, chills, cp, palp, sob, n/v, diarrhea, brbpr, black stool.     Subjective:   Patient in bed, calm, wants to eat breakfast, denies any chest pain or shortness of breath no subjective complaints.   Assessment  & Plan :     1.  Acute Covid 19 Viral Pneumonitis during the ongoing 2020 Covid 19 Pandemic -  Stable for now on room air, inflammatory markers relatively stable, continue to monitor her.  She will require placement.   Per family request  second COVID test was ordered on 08/12/2018 which is positive on 08/14/2018 as expected.   COVID-19 Labs  Recent Labs    08/12/18 0350 08/13/18 0330  DDIMER 0.29 0.27  FERRITIN 105 104  LDH 163 150  CRP <0.8 <0.8    Lab Results  Component Value Date   SARSCOV2NAA DETECTED (A) 08/12/2018   SARSCOV2NAA POSITIVE (A) 08/06/2018   [place]ina Not Detected 07/06/2018     Hepatic Function Latest Ref Rng & Units 08/13/2018 08/12/2018 08/11/2018  Total Protein 6.5 - 8.1 g/dL 5.9(L)  5.9(L) 5.8(L)  Albumin 3.5 - 5.0 g/dL 3.3(L) 3.4(L) 3.5  AST 15 - 41 U/L 39 38 34  ALT 0 - 44 U/L 39 34 31  Alk Phosphatase 38 - 126 U/L 56 54 50  Total Bilirubin 0.3 -  1.2 mg/dL 0.3 0.2(L) 0.2(L)  Bilirubin, Direct 0.0 - 0.3 mg/dL - - -        Component Value Date/Time   BNP 31.1 08/13/2018 0330      2.  Advanced dementia.  Does have some hospital-acquired delirium, encephalopathy which is nonspecific and likely due to being in the unfamiliar setting.  PRN IV Haldol, will add nighttime Seroquel at the lowest possible dose, fall precautions, remote camera sitter.  Will also involve PT OT, advance activity exposed to sunlight to see if it helps.  Speech therapy also to assist.  For now soft diet with feeding assistance and aspiration precautions.  Baseline quality of life appears to be quite poor.  Note patient is still requiring assistance to be fed and remains at risk for fall due to delirium and baseline dementia.  3.  Hypertension.  Stable on Norvasc and PRN IV Hydralazine.  4.  History of iron deficiency anemia.  Continue oral iron supplements.      Condition - Fair  Family Communication  :  Daughter on 08/10/18 x 2, explained making her mom DNR, explained her 2nd time, she agrees.  Updated again on 08/12/2018.  Also told that second COVID test was ordered on 08/12/2018 but highly unlikely to be negative.  Test usually stays positive for 6 to 8 weeks.  Code Status :  DNR  Diet :    Diet Order            DIET SOFT Room service appropriate? Yes; Fluid consistency: Thin  Diet effective now               Disposition Plan  :  SNF  Consults  :  None  Procedures  :  None  PUD Prophylaxis :    DVT Prophylaxis  :   Heparin    Lab Results  Component Value Date   PLT 214 08/13/2018    Inpatient Medications  Scheduled Meds: . acetaminophen  1,000 mg Oral QHS  . benzonatate  100 mg Oral TID  . citalopram  10 mg Oral Daily  . donepezil  5 mg Oral QHS  . famotidine   40 mg Oral Daily  . feeding supplement (ENSURE ENLIVE)  237 mL Oral BID BM  . ferrous sulfate  325 mg Oral Q breakfast  . heparin injection (subcutaneous)  5,000 Units Subcutaneous Q8H  . latanoprost  1 drop Both Eyes QHS  . Melatonin  3 mg Oral QHS  . QUEtiapine  50 mg Oral BID  . vitamin C  500 mg Oral Daily  . zinc sulfate  220 mg Oral Daily   Continuous Infusions: PRN Meds:.acetaminophen, albuterol, haloperidol lactate, hydrALAZINE  Antibiotics  :    Anti-infectives (From admission, onward)   None       Time Spent in minutes  30   Lala Lund M.D on 08/14/2018 at 9:16 AM  To page go to [email or web address] - password [email or web address]  [facility] -  Office  [phone]  See all Orders from today for further details    Objective:   Vitals:   08/13/18 1547 08/13/18 2025 08/13/18 2033 08/14/18 0730  BP: (!) 142/71 126/87  (!) 147/58  Pulse: 77   64  Resp:      Temp: 98.5 F (36.9 C)  98.7 F (37.1 C) 98.4 F (36.9 C)  TempSrc: Oral  Oral Oral  SpO2: 97%  97% 98%  Weight:      Height:  Wt Readings from Last 3 Encounters:  08/07/18 45.8 kg  07/06/18 44.9 kg  03/14/18 44.9 kg     Intake/Output Summary (Last 24 hours) at 08/14/2018 0916 Last data filed at 08/13/2018 1519 Gross per 24 hour  Intake 300 ml  Output -  Net 300 ml     Physical Exam  Awake but remains confused - her baseline, No new F.N deficits, remains calm [place].AT,PERRAL Supple Neck,No JVD, No cervical lymphadenopathy appriciated.  Symmetrical Chest wall movement, Good air movement bilaterally, CTAB RRR,No Gallops, Rubs or new Murmurs, No Parasternal Heave +ve B.Sounds, Abd Soft, No tenderness, No organomegaly appriciated, No rebound - guarding or rigidity. No Cyanosis, Clubbing or edema, No new Rash or bruise    Data Review:    CBC  Recent Labs  Lab 08/08/18 0505 08/09/18 1005 08/11/18 0215 08/12/18 0350 08/13/18 0330  WBC 2.6* 5.5 3.2* 3.0* 3.2*  HGB 11.7* 14.0 11.3* 11.4*  11.5*  HCT 36.8 41.5 34.9* 35.6* 35.2*  PLT 199 280 209 209 214  MCV 93.9 91.4 93.1 94.4 92.9  MCH 29.8 30.8 30.1 30.2 30.3  MCHC 31.8 33.7 32.4 32.0 32.7  RDW 11.9 11.9 12.0 12.1 12.0  LYMPHSABS 0.4* 1.4 1.2 1.2 1.2  MONOABS 0.2 1.0 0.8 0.7 0.6  EOSABS 0.0 0.0 0.0 0.0 0.1  BASOSABS 0.0 0.0 0.0 0.0 0.0    Chemistries    Recent Labs  Lab 08/08/18 0505 08/09/18 1005 08/11/18 0215 08/12/18 0350 08/13/18 0330  NA 132* 134* 137 139 138  K 3.9 3.5 3.7 3.8 3.7  CL 95* 93* 97* 99 99  CO2 _0 GLUCOSE 137* 89 86 91 100*  BUN 16 33* 24* 21 23  CREATININE 0.76 0.91 0.78 0.76 0.61  CALCIUM 9.0 9.7 9.0 8.9 8.7*  MG  --   --  2.1 2.2 2.0  AST 30 47* 34 38 39  ALT _1 34 39  ALKPHOS 56 61 50 54 56  BILITOT 0.4 0.5 0.2* 0.2* 0.3   ------------------------------------------------------------------------------------------------------------------ No results for input(s): CHOL, HDL, LDLCALC, TRIG, CHOLHDL, LDLDIRECT in the last 72 hours.  Lab Results  Component Value Date   HGBA1C 5.4 04/27/2017   ------------------------------------------------------------------------------------------------------------------ No results for input(s): TSH, T4TOTAL, T3FREE, THYROIDAB in the last 72 hours.  Invalid input(s): FREET3  Cardiac Enzymes No results for input(s): CKMB, TROPONINI, MYOGLOBIN in the last 168 hours.  Invalid input(s): CK ------------------------------------------------------------------------------------------------------------------    Component Value Date/Time   BNP 31.1 08/13/2018 0330    Micro Results Recent Results (from the past 240 hour(s))  SARS Coronavirus 2 (CEPHEID- Performed in [place] hospital lab), Hosp Order     Status: Abnormal   Collection Time: 08/06/18  9:41 PM   Specimen: Nasopharyngeal Swab  Result Value Ref Range Status   SARS Coronavirus 2 POSITIVE (A) NEGATIVE Final    Comment: RESULT CALLED TO, READ BACK BY AND VERIFIED  WITH: BEAULAURIER AT 007 ON 08/07/2018 BY JPM (NOTE) If result is NEGATIVE SARS-CoV-2 target nucleic acids are NOT DETECTED. The SARS-CoV-2 RNA is generally detectable in upper and lower  respiratory specimens during the acute phase of infection. The lowest  concentration of SARS-CoV-2 viral copies this assay can detect is 250  copies / mL. A negative result does not preclude SARS-CoV-2 infection  and should not be used as the sole basis for treatment or other  patient management decisions.  A negative result may occur with  improper specimen collection / handling, submission of  specimen other  than nasopharyngeal swab, presence of viral mutation(s) within the  areas targeted by this assay, and inadequate number of viral copies  (<250 copies / mL). A negative result must be combined with clinical  observations, patient history, and epidemiological information. If result is POSITIVE SARS-CoV-2 target nucleic acids are DETECTED.  The SARS-CoV-2 RNA is generally detectable in upper and lower  respiratory specimens during the acute phase of infection.  Positive  results are indicative of active infection with SARS-CoV-2.  Clinical  correlation with patient history and other diagnostic information is  necessary to determine patient infection status.  Positive results do  not rule out bacterial infection or co-infection with other viruses. If result is PRESUMPTIVE POSTIVE SARS-CoV-2 nucleic acids MAY BE PRESENT.   A presumptive positive result was obtained on the submitted specimen  and confirmed on repeat testing.  While 2019 novel coronavirus  (SARS-CoV-2) nucleic acids may be present in the submitted sample  additional confirmatory testing may be necessary for epidemiological  and / or clinical management purposes  to differentiate between  SARS-CoV-2 and other Sarbecovirus currently known to infect humans.  If clinically indicated additional testing with an alternate test  methodology  [id number])  is advised. The SARS-CoV-2 RNA is generally  detectable in upper and lower respiratory specimens during the acute  phase of infection. The expected result is Negative. Fact Sheet for Patients:  StrictlyIdeas.no Fact Sheet for Healthcare Providers: BankingDealers.co.za This test is not yet approved or cleared by the [place] FDA and has been authorized for detection and/or diagnosis of SARS-CoV-2 by FDA under an Emergency Use Authorization (EUA).  This EUA will remain in effect (meaning this test can be used) for the duration of the COVID-19 declaration under Section 564(b)(1) of the Act, 21 U.S.C. section 360bbb-3(b)(1), unless the authorization is terminated or revoked sooner. Performed at [facility], [place] [address] [place], [place] [address]   MRSA PCR Screening     Status: None   Collection Time: 08/07/18 11:53 AM   Specimen: Nasal Mucosa; Nasopharyngeal  Result Value Ref Range Status   MRSA by PCR NEGATIVE NEGATIVE Final    Comment:        The GeneXpert MRSA Assay (FDA approved for NASAL specimens only), is one component of a comprehensive MRSA colonization surveillance program. It is not intended to diagnose MRSA infection nor to guide or monitor treatment for MRSA infections. Performed at [facility], [place] [address] [place], [place] [address]   Novel Coronavirus, NAA (hospital order; send-out to ref lab)     Status: Abnormal   Collection Time: 08/12/18  9:59 AM   Specimen: Nasopharyngeal Swab; Respiratory  Result Value Ref Range Status   SARS-CoV-2, NAA DETECTED (A) NOT DETECTED Final    Comment: (NOTE)                  Client Requested Flag This test was developed and its performance characteristics determined by [employer]. This test has not been FDA cleared or approved. This test has been authorized by FDA under an Emergency Use Authorization (EUA).  This test is only authorized for the duration of time the declaration that circumstances exist justifying the authorization of the emergency use of in vitro diagnostic tests for detection of SARS-CoV-2 virus and/or diagnosis of COVID-19 infection under section 564(b)(1) of the Act, 21 U.S.C. 103PRX-4(V)(8), unless the authorization is terminated or revoked sooner. When diagnostic testing is negative, the possibility of a false negative  result should be considered in the context of a patient's recent exposures and the presence of clinical signs and symptoms consistent with COVID-19. An individual without symptoms of COVID-19 and who is not shedding SARS-CoV-2 virus would expect to have a negative (not det ected) result in this assay. Performed At: [facility] [place], [place] [id number] Rush Farmer MD TV:[id number]    [place]  Final    Comment: Performed at [place] [address] [place], [place] [address]    Radiology Reports Dg Chest Portable 1 View  Result Date: 08/06/2018 CLINICAL DATA:  83 year old female with cough. EXAM: PORTABLE CHEST 1 VIEW COMPARISON:  Chest radiograph dated 12/13/2017 FINDINGS: The patient is tilted to the left. Minimal left lung base atelectatic changes. No focal consolidation, or pleural effusion. Thin lucency along the upper mediastinum to the left of midline, likely artifact. A pneumothorax is less likely. The cardiac silhouette is within normal limits. No acute osseous pathology. IMPRESSION: No active disease. Electronically Signed   By: Anner Crete M.D.   On: 08/06/2018 21:27

## 2018-08-15 MED ORDER — QUETIAPINE FUMARATE 50 MG PO TABS: 50.0000 mg | ORAL_TABLET | Freq: Two times a day (BID) | ORAL | Status: DC

## 2018-08-15 NOTE — Progress Notes (Signed)
Spoke with Langley Gauss over phone and [employer]. Questions answered, update given.

## 2018-08-15 NOTE — Discharge Instructions (Signed)
Follow with Primary MD Janith Lima, MD in 7 days   Get CBC, CMP, 2 view Chest X ray -  checked next visit within 1 week by Primary MD or SNF MD    Activity: As tolerated with Full fall precautions use walker/cane & assistance as needed  Disposition SNF  Diet: Soft dysphagia 1 diet with feeding assistance and aspiration precautions.  Special Instructions: If you have smoked or chewed Tobacco  in the last 2 yrs please stop smoking, stop any regular Alcohol  and or any Recreational drug use.  On your next visit with your primary care physician please Get Medicines reviewed and adjusted.  Please request your Prim.MD to go over all Hospital Tests and Procedure/Radiological results at the follow up, please get all Hospital records sent to your Prim MD by signing hospital release before you go home.  If you experience worsening of your admission symptoms, develop shortness of breath, life threatening emergency, suicidal or homicidal thoughts you must seek medical attention immediately by calling 911 or calling your MD immediately  if symptoms less severe.  You Must read complete instructions/literature along with all the possible adverse reactions/side effects for all the Medicines you take and that have been prescribed to you. Take any new Medicines after you have completely understood and accpet all the possible adverse reactions/side effects.

## 2018-08-15 NOTE — Care Management (Signed)
Patient's COVID test remains positive. She has no bed offers and family wants her to return to [place]. Social worker had been in contact with patient's daughter.     Ricki Miller, RN BSN Case Manager [phone]

## 2018-08-15 NOTE — Progress Notes (Signed)
Spoke with daughter, Langley Gauss, to give update. She appreciated the call and care.

## 2018-08-15 NOTE — Discharge Summary (Addendum)
Shaun Zuccaro OMV:[id number] DOB: [birth year] DOA: 08/06/2018  PCP: Janith Lima, MD  Admit date: 08/06/2018  Discharge date: 08/17/2018 (original discharge 08/15/2018.  No changes in plan, bed available today)  Admitted From: ALF   Disposition:  ALF   Recommendations for Outpatient Follow-up:   Follow up with PCP in 1-2 weeks  PCP Please obtain BMP/CBC, 2 view CXR in 1week,  (see Discharge instructions)   PCP Please follow up on the following pending results:    Home Health: None   Equipment/Devices: None  Consultations: None  Discharge Condition: Stable    CODE STATUS: DNR   Diet Recommendation: Soft dysphagia 1 diet    Chief Complaint  Patient presents with  . Cough     Brief history of present illness from the day of admission and additional interim summary    BrendaClarkis a83 y.o.female,w hypertension, dementia with behavioral disburbance, apparently presents with cough, and agitation, and is covid +. Her facility [facility] will not take her back covid +. Pt is also agitated and they have not sitter at [facility] which is an ALF and therefore pt will require admission due to behavioral disturbance as well. Pt is unable to provide any history. No complaints of fever, chills, cp, palp, sob, n/v, diarrhea, brbpr, black stool.                                                                  Hospital Course   1.  Acute Covid 19 Viral Pneumonitis during the ongoing 2020 Covid 19 Pandemic -  Stable for now on room air, inflammatory markers relatively stable, continue to monitor her.  She will require placement and will be discharged to SNF as soon as bed is available likely on 08/15/2018.   Per family request  second COVID test was ordered on 08/12/2018 and as expected was positive.  Test usually stays  positive for 6 to 8 weeks.  This was explained to the family but they still insisted to repeat the test.   2.  Advanced dementia.  Does have some hospital-acquired delirium, encephalopathy which is nonspecific and likely due to being in the unfamiliar setting.  Now well controlled on Seroquel twice daily with PRN IV Haldol which is hardly needed.    Continue close monitoring here and then at SNF.    She is quite frail and requires assistance with feeding of dysphagia 1 diet with aspiration precautions.  She will be discharged to SNF on Seroquel on which she seems to be quite calm.  3.  Hypertension.  Continue previous regimen at SNF, monitor and adjust.  4.  History of iron deficiency anemia.  Continue oral iron supplements.  Discharge diagnosis     Principal Problem:   COVID-19 virus infection Active Problems:   Essential hypertension  Senile dementia with behavioral disturbance ([place])   Acute respiratory disease due to COVID-19 virus   Coronavirus infection    Discharge instructions    Discharge Instructions    Discharge instructions   Complete by: As directed    Follow with Primary MD Janith Lima, MD in 7 days   Get CBC, CMP, 2 view Chest X ray -  checked next visit within 1 week by Primary MD or SNF MD    Activity: As tolerated with Full fall precautions use walker/cane & assistance as needed  Disposition SNF  Diet: Soft dysphagia 1 diet with feeding assistance and aspiration precautions.  Special Instructions: If you have smoked or chewed Tobacco  in the last 2 yrs please stop smoking, stop any regular Alcohol  and or any Recreational drug use.  On your next visit with your primary care physician please Get Medicines reviewed and adjusted.  Please request your Prim.MD to go over all Hospital Tests and Procedure/Radiological results at the follow up, please get all Hospital records sent to your Prim MD by signing hospital release before you go home.  If you  experience worsening of your admission symptoms, develop shortness of breath, life threatening emergency, suicidal or homicidal thoughts you must seek medical attention immediately by calling 911 or calling your MD immediately  if symptoms less severe.  You Must read complete instructions/literature along with all the possible adverse reactions/side effects for all the Medicines you take and that have been prescribed to you. Take any new Medicines after you have completely understood and accpet all the possible adverse reactions/side effects.   Discharge patient   Complete by: As directed    Discharge disposition: 03-Skilled Nursing Facility   Discharge patient date: 08/17/2018   Increase activity slowly   Complete by: As directed       Discharge Medications   Allergies as of 08/17/2018      Reactions   Codeine Other (See Comments)   REACTION: violently ill with N&V   Namzaric [memantine Hcl-donepezil Hcl] Nausea And Vomiting   Pt reports illness. .   Norvasc [amlodipine Besylate] Nausea And Vomiting   Vasotec Other (See Comments)   cough      Medication List    STOP taking these medications   LORAZEPAM PO     TAKE these medications   citalopram 10 MG tablet Commonly known as: CELEXA TAKE 1 TABLET ONCE DAILY.   donepezil 5 MG tablet Commonly known as: ARICEPT Take 1 tablet (5 mg total) by mouth at bedtime.   feeding supplement (ENSURE ENLIVE) Liqd Take 237 mLs by mouth 2 (two) times daily between meals.   ferrous sulfate 325 (65 FE) MG tablet Take 1 tablet (325 mg total) by mouth 2 (two) times daily with a meal. What changed: when to take this   latanoprost 0.005 % ophthalmic solution Commonly known as: XALATAN Place 1 drop into both eyes at bedtime.   losartan-hydrochlorothiazide 100-12.5 MG tablet Commonly known as: HYZAAR TAKE 1 TABLET ONCE DAILY.   Melatonin 3 MG Tabs Take 3 mg by mouth at bedtime.   QUEtiapine 50 MG tablet Commonly known as: SEROQUEL  Take 1 tablet (50 mg total) by mouth 2 (two) times daily.       Follow-up Information    Janith Lima, MD. Schedule an appointment as soon as possible for a visit today.   Specialty: Internal Medicine Contact information: [address] [place] [address] [phone]  Major procedures and Radiology Reports - PLEASE review detailed and final reports thoroughly  -         Dg Chest Portable 1 View  Result Date: 08/06/2018 CLINICAL DATA:  83 year old female with cough. EXAM: PORTABLE CHEST 1 VIEW COMPARISON:  Chest radiograph dated 12/13/2017 FINDINGS: The patient is tilted to the left. Minimal left lung base atelectatic changes. No focal consolidation, or pleural effusion. Thin lucency along the upper mediastinum to the left of midline, likely artifact. A pneumothorax is less likely. The cardiac silhouette is within normal limits. No acute osseous pathology. IMPRESSION: No active disease. Electronically Signed   By: Anner Crete M.D.   On: 08/06/2018 21:27    Micro Results     Recent Results (from the past 240 hour(s))  Novel Coronavirus, NAA (hospital order; send-out to ref lab)     Status: Abnormal   Collection Time: 08/12/18  9:59 AM   Specimen: Nasopharyngeal Swab; Respiratory  Result Value Ref Range Status   SARS-CoV-2, NAA DETECTED (A) NOT DETECTED Final    Comment: (NOTE)                  Client Requested Flag This test was developed and its performance characteristics determined by [employer]. This test has not been FDA cleared or approved. This test has been authorized by FDA under an Emergency Use Authorization (EUA). This test is only authorized for the duration of time the declaration that circumstances exist justifying the authorization of the emergency use of in vitro diagnostic tests for detection of SARS-CoV-2 virus and/or diagnosis of COVID-19 infection under section 564(b)(1) of the Act, 21 U.S.C.  063KZS-0(F)(0), unless the authorization is terminated or revoked sooner. When diagnostic testing is negative, the possibility of a false negative result should be considered in the context of a patient's recent exposures and the presence of clinical signs and symptoms consistent with COVID-19. An individual without symptoms of COVID-19 and who is not shedding SARS-CoV-2 virus would expect to have a negative (not det ected) result in this assay. Performed At: [facility], [place], [place] [id number] Rush Farmer MD KG:[id number]    [place]  Final    Comment: Performed at [facility] [address] [place], [place] [address]    Today   Subjective    [name] today has no headache,no chest abdominal pain,no new weakness tingling or numbness, feels much better    Objective   Blood pressure 137/62, pulse 74, temperature 98.3 F (36.8 C), temperature source Oral, resp. rate 16, height 5' (1.524 m), weight 45.8 kg, SpO2 99 %.   Intake/Output Summary (Last 24 hours) at 08/17/2018 1404 Last data filed at 08/16/2018 1800 Gross per 24 hour  Intake 100 ml  Output -  Net 100 ml    Exam Awake but pleasantly confused yet calm, No new F.N deficits,   [place].AT,PERRAL Supple Neck,No JVD, No cervical lymphadenopathy appriciated.  Symmetrical Chest wall movement, Good air movement bilaterally, CTAB RRR,No Gallops,Rubs or new Murmurs, No Parasternal Heave +ve B.Sounds, Abd Soft, Non tender, No organomegaly appriciated, No rebound -guarding or rigidity. No Cyanosis, Clubbing or edema, No new Rash or bruise   Data Review   CBC w Diff:  Lab Results  Component Value Date   WBC 3.2 (L) 08/13/2018   HGB 11.5 (L) 08/13/2018   HCT 35.2 (L) 08/13/2018   PLT 214 08/13/2018   LYMPHOPCT 38 08/13/2018   MONOPCT 17 08/13/2018   EOSPCT  2 08/13/2018   BASOPCT 1 08/13/2018    CMP:  Lab Results  Component Value Date    NA 138 08/13/2018   K 3.7 08/13/2018   CL 99 08/13/2018   CO2 29 08/13/2018   BUN 23 08/13/2018   CREATININE 0.61 08/13/2018   PROT 5.9 (L) 08/13/2018   ALBUMIN 3.3 (L) 08/13/2018   BILITOT 0.3 08/13/2018   ALKPHOS 56 08/13/2018   AST 39 08/13/2018   ALT 39 08/13/2018  .  COVID-19 Labs  No results for input(s): DDIMER, FERRITIN, LDH, CRP in the last 72 hours.  Lab Results  Component Value Date   SARSCOV2NAA DETECTED (A) 08/12/2018   SARSCOV2NAA POSITIVE (A) 08/06/2018   [place] Not Detected 07/06/2018    Total Time in preparing paper work, data evaluation and todays exam - 54 minutes  Lala Lund M.D on 08/17/2018 at 2:04 PM  [facility]   Office  [phone]

## 2018-08-16 MED ORDER — HALOPERIDOL LACTATE 5 MG/ML IJ SOLN
5.0000 mg | Freq: Once | INTRAMUSCULAR | Status: AC
  Administered 2018-08-16: 5 mg via INTRAMUSCULAR

## 2018-08-16 MED ORDER — HALOPERIDOL 5 MG PO TABS: 5.0000 mg | ORAL_TABLET | Freq: Once | ORAL | Status: AC

## 2018-08-16 NOTE — Progress Notes (Signed)
Physical Therapy Discharge Patient Details Name: Vicki Garcia MRN: [id number] DOB: April 17, 1935 Today's Date: 08/16/2018 Time:  -     Patient discharged from PT services secondary to goals met and no further PT needs identified.Patient observed up ad lib in room. Does  Not require skilled PT. Plans to return to Memory care at [facility].   Please see latest therapy progress note for current level of functioning and progress toward goals.       Vicki Garcia   Office [phone]  08/16/2018, 12:09 PM

## 2018-08-16 NOTE — Progress Notes (Signed)
Nutrition Follow-up RD working remotely.  DOCUMENTATION CODES:   Not applicable  INTERVENTION:   Continue Ensure Enlive po BID, each supplement provides 350 kcal and 20 grams of protein  Continue Hormel Shake daily with Breakfast which provides 520 kcals and 22 g of protein and Magic cup BID with lunch and dinner, each supplement provides 290 kcal and 9 grams of protein, automatically on meal trays to optimize nutritional intake.   NUTRITION DIAGNOSIS:   Increased nutrient needs related to acute illness as evidenced by estimated needs.  Ongoing   GOAL:   Patient will meet greater than or equal to 90% of their needs  Unmet   MONITOR:   PO intake, Supplement acceptance, Labs, Weight trends  ASSESSMENT:   83 year old female with dementia and HTN. She presented to ED from [facility] ALF with persistent coughing for 1 day and agitation. Patient was found to be COVID positive. Facility reported several positive COVID-19 patients at the facility. Patient was unable to provide any history due to dementia and agitation. CXR showed no consolidation or infiltrates.  Patient has been discharged, awaiting placement in the memory care unit at her assisted living facility. Awaiting negative COVID test before the facility will accept her.   PO intake remains poor. Patient is drinking 1-2 Ensure Enlive supplements per day. Diet was changed to soft on 7/22. She is consuming 0-40% of meals.   Per review of progress notes, patient has been agitated and combative this afternoon. Unable to speak with her on the phone.   Labs and medications reviewed.  No new weight available.   Diet Order:   Diet Order            DIET SOFT Room service appropriate? Yes; Fluid consistency: Thin  Diet effective now              EDUCATION NEEDS:   No education needs have been identified at this time  Skin:  Skin Assessment: Reviewed RN Assessment  Last BM:  7/24  Height:   Ht Readings from  Last 1 Encounters:  08/07/18 5' (1.524 m)    Weight:   Wt Readings from Last 1 Encounters:  08/07/18 45.8 kg    Ideal Body Weight:  45.4 kg  BMI:  Body mass index is 19.72 kg/m.  Estimated Nutritional Needs:   Kcal:  1500-1700 kcal  Protein:  65-80 grams  Fluid:  >/= 1.8 L/day    Molli Barrows, RD, LDN, [place] Pager [phone] After Hours Pager [phone]

## 2018-08-16 NOTE — Progress Notes (Signed)
Patient agitated and leaving room constantly, refusing to wear mask, combative and pulling away with attempts to redirect.  Dr. Candiss Norse notified. Order for 5mg  IM haldol x 1.

## 2018-08-16 NOTE — Progress Notes (Signed)
PROGRESS NOTE                                                                                                                                                                                                             Patient Demographics:    Vicki Garcia, is a 83 y.o. female, DOB - [birth year], HRC:[id number]  Outpatient Primary MD for the patient is Janith Lima, MD    LOS - 0  Admit date - 08/06/2018    Chief Complaint  Patient presents with  . Cough       Brief Narrative   Vicki Garcia  is a 83 y.o. female, w hypertension, dementia with behavioral disburbance, apparently presents with cough, and agitation, and is covid +.  Her facility [facility] will not take her back covid +. Pt is also agitated and they have not sitter at [facility] which is an ALF and therefore pt will require admission due to behavioral disturbance as well.  Pt is unable to provide any history. No complaints of fever, chills, cp, palp, sob, n/v, diarrhea, brbpr, black stool.   Patient is medically stable and she was discharged on 08/15/2018.  We await bed for placement.  She will likely remain positive for COVID for the next 4 to 6 weeks and arrangements have to be made accordingly.  Social work has been consulted several days ago.    Subjective:   Patient in bed, calm, denies any headache chest or abdominal pain.  Remains baseline confused.   Assessment  & Plan :     1.  Acute Covid 19 Viral Pneumonitis during the ongoing 2020 Covid 19 Pandemic -  Stable for now on room air, inflammatory markers relatively stable, continue to monitor her.  She will require placement.   Per family request  second COVID test was ordered on 08/12/2018 which is positive on 08/14/2018 as expected.   COVID-19 Labs  No results for input(s): DDIMER, FERRITIN, LDH, CRP in the last 72 hours.  Lab Results  Component Value Date   SARSCOV2NAA DETECTED (A) 08/12/2018    SARSCOV2NAA POSITIVE (A) 08/06/2018   Geraldine Not Detected 07/06/2018     Hepatic Function Latest Ref Rng & Units 08/13/2018 08/12/2018 08/11/2018  Total Protein 6.5 - 8.1 g/dL 5.9(L) 5.9(L) 5.8(L)  Albumin 3.5 - 5.0 g/dL 3.3(L) 3.4(L) 3.5  AST 15 -  41 U/L 39 38 34  ALT 0 - 44 U/L 39 34 31  Alk Phosphatase 38 - 126 U/L 56 54 50  Total Bilirubin 0.3 - 1.2 mg/dL 0.3 0.2(L) 0.2(L)  Bilirubin, Direct 0.0 - 0.3 mg/dL - - -        Component Value Date/Time   BNP 31.1 08/13/2018 0330      2.  Advanced dementia.  Does have some hospital-acquired delirium, encephalopathy which is nonspecific and likely due to being in the unfamiliar setting.  PRN IV Haldol, will add nighttime Seroquel at the lowest possible dose, fall precautions, remote camera sitter.  Will also involve PT OT, advance activity exposed to sunlight to see if it helps.  Speech therapy also to assist.  For now soft diet with feeding assistance and aspiration precautions.  Baseline quality of life appears to be quite poor.  Note patient is still requiring assistance to be fed and remains at risk for fall due to delirium and baseline dementia.  3.  Hypertension.  Stable on Norvasc and PRN IV Hydralazine.  4.  History of iron deficiency anemia.  Continue oral iron supplements.      Condition - Fair  Family Communication  :  Daughter on 08/10/18 x 2, explained making her mom DNR, explained her 2nd time, she agrees.  Updated again on 08/12/2018.  Also told that second COVID test was ordered on 08/12/2018 but highly unlikely to be negative.  Test usually stays positive for 6 to 8 weeks.  Code Status :  DNR  Diet :    Diet Order            DIET SOFT Room service appropriate? Yes; Fluid consistency: Thin  Diet effective now               Disposition Plan  :  SNF once bed available, was discharged on 08/15/2018.  Consults  :  None  Procedures  :  None  PUD Prophylaxis :    DVT Prophylaxis  :   Heparin    Lab  Results  Component Value Date   PLT 214 08/13/2018    Inpatient Medications  Scheduled Meds: . acetaminophen  1,000 mg Oral QHS  . benzonatate  100 mg Oral TID  . citalopram  10 mg Oral Daily  . donepezil  5 mg Oral QHS  . famotidine  40 mg Oral Daily  . feeding supplement (ENSURE ENLIVE)  237 mL Oral BID BM  . ferrous sulfate  325 mg Oral Q breakfast  . heparin injection (subcutaneous)  5,000 Units Subcutaneous Q8H  . latanoprost  1 drop Both Eyes QHS  . Melatonin  3 mg Oral QHS  . QUEtiapine  50 mg Oral BID  . vitamin C  500 mg Oral Daily  . zinc sulfate  220 mg Oral Daily   Continuous Infusions: PRN Meds:.acetaminophen, albuterol, haloperidol lactate, hydrALAZINE  Antibiotics  :    Anti-infectives (From admission, onward)   None       Time Spent in minutes  30   Lala Lund M.D on 08/16/2018 at 9:20 AM  To page go to [email or web address] - password [facility]  [facility] -  Office  [phone]  See all Orders from today for further details    Objective:   Vitals:   08/15/18 1548 08/15/18 2000 08/16/18 0541 08/16/18 0802  BP: 129/63 135/75 125/64 130/65  Pulse:  77 62 65  Resp: _0 Temp: 97.9 F (36.6 C) 98.5  F (36.9 C) 98.8 F (37.1 C) 98.3 F (36.8 C)  TempSrc: Oral Oral Oral Oral  SpO2: 97% 98% 96% 100%  Weight:      Height:        Wt Readings from Last 3 Encounters:  08/07/18 45.8 kg  07/06/18 44.9 kg  03/14/18 44.9 kg     Intake/Output Summary (Last 24 hours) at 08/16/2018 0920 Last data filed at 08/15/2018 1742 Gross per 24 hour  Intake 480 ml  Output -  Net 480 ml     Physical Exam  Awake, calm but confused, no focal neurological deficits, moving all 4 extremities, [place].AT,PERRAL Supple Neck,No JVD, No cervical lymphadenopathy appriciated.  Symmetrical Chest wall movement, Good air movement bilaterally, CTAB RRR,No Gallops, Rubs or new Murmurs, No Parasternal Heave +ve B.Sounds, Abd Soft, No tenderness, No organomegaly  appriciated, No rebound - guarding or rigidity. No Cyanosis, Clubbing or edema, No new Rash or bruise    Data Review:    CBC  Recent Labs  Lab 08/09/18 1005 08/11/18 0215 08/12/18 0350 08/13/18 0330  WBC 5.5 3.2* 3.0* 3.2*  HGB 14.0 11.3* 11.4* 11.5*  HCT 41.5 34.9* 35.6* 35.2*  PLT 280 209 209 214  MCV 91.4 93.1 94.4 92.9  MCH 30.8 30.1 30.2 30.3  MCHC 33.7 32.4 32.0 32.7  RDW 11.9 12.0 12.1 12.0  LYMPHSABS 1.4 1.2 1.2 1.2  MONOABS 1.0 0.8 0.7 0.6  EOSABS 0.0 0.0 0.0 0.1  BASOSABS 0.0 0.0 0.0 0.0    Chemistries    Recent Labs  Lab 08/09/18 1005 08/11/18 0215 08/12/18 0350 08/13/18 0330  NA 134* 137 139 138  K 3.5 3.7 3.8 3.7  CL 93* 97* 99 99  CO2 _0 GLUCOSE 89 86 91 100*  BUN 33* 24* 21 23  CREATININE 0.91 0.78 0.76 0.61  CALCIUM 9.7 9.0 8.9 8.7*  MG  --  2.1 2.2 2.0  AST 47* 34 38 39  ALT 31 31 34 39  ALKPHOS 61 50 54 56  BILITOT 0.5 0.2* 0.2* 0.3   ------------------------------------------------------------------------------------------------------------------ No results for input(s): CHOL, HDL, LDLCALC, TRIG, CHOLHDL, LDLDIRECT in the last 72 hours.  Lab Results  Component Value Date   HGBA1C 5.4 04/27/2017   ------------------------------------------------------------------------------------------------------------------ No results for input(s): TSH, T4TOTAL, T3FREE, THYROIDAB in the last 72 hours.  Invalid input(s): FREET3  Cardiac Enzymes No results for input(s): CKMB, TROPONINI, MYOGLOBIN in the last 168 hours.  Invalid input(s): CK ------------------------------------------------------------------------------------------------------------------    Component Value Date/Time   BNP 31.1 08/13/2018 0330    Micro Results Recent Results (from the past 240 hour(s))  SARS Coronavirus 2 (CEPHEID- Performed in [facility] hospital lab), Hosp Order     Status: Abnormal   Collection Time: 08/06/18  9:41 PM   Specimen:  Nasopharyngeal Swab  Result Value Ref Range Status   SARS Coronavirus 2 POSITIVE (A) NEGATIVE Final    Comment: RESULT CALLED TO, READ BACK BY AND VERIFIED WITH: BEAULAURIER AT 007 ON 08/07/2018 BY JPM (NOTE) If result is NEGATIVE SARS-CoV-2 target nucleic acids are NOT DETECTED. The SARS-CoV-2 RNA is generally detectable in upper and lower  respiratory specimens during the acute phase of infection. The lowest  concentration of SARS-CoV-2 viral copies this assay can detect is 250  copies / mL. A negative result does not preclude SARS-CoV-2 infection  and should not be used as the sole basis for treatment or other  patient management decisions.  A negative result may occur with  improper  specimen collection / handling, submission of specimen other  than nasopharyngeal swab, presence of viral mutation(s) within the  areas targeted by this assay, and inadequate number of viral copies  (<250 copies / mL). A negative result must be combined with clinical  observations, patient history, and epidemiological information. If result is POSITIVE SARS-CoV-2 target nucleic acids are DETECTED.  The SARS-CoV-2 RNA is generally detectable in upper and lower  respiratory specimens during the acute phase of infection.  Positive  results are indicative of active infection with SARS-CoV-2.  Clinical  correlation with patient history and other diagnostic information is  necessary to determine patient infection status.  Positive results do  not rule out bacterial infection or co-infection with other viruses. If result is PRESUMPTIVE POSTIVE SARS-CoV-2 nucleic acids MAY BE PRESENT.   A presumptive positive result was obtained on the submitted specimen  and confirmed on repeat testing.  While 2019 novel coronavirus  (SARS-CoV-2) nucleic acids may be present in the submitted sample  additional confirmatory testing may be necessary for epidemiological  and / or clinical management purposes  to differentiate  between  SARS-CoV-2 and other Sarbecovirus currently known to infect humans.  If clinically indicated additional testing with an alternate test  methodology [phone])  is advised. The SARS-CoV-2 RNA is generally  detectable in upper and lower respiratory specimens during the acute  phase of infection. The expected result is Negative. Fact Sheet for Patients:  StrictlyIdeas.no Fact Sheet for Healthcare Providers: BankingDealers.co.za This test is not yet approved or cleared by the [place] FDA and has been authorized for detection and/or diagnosis of SARS-CoV-2 by FDA under an Emergency Use Authorization (EUA).  This EUA will remain in effect (meaning this test can be used) for the duration of the COVID-19 declaration under Section 564(b)(1) of the Act, 21 U.S.C. section 360bbb-3(b)(1), unless the authorization is terminated or revoked sooner. Performed at [facility], [place] [address], [place], [place] [address]   MRSA PCR Screening     Status: None   Collection Time: 08/07/18 11:53 AM   Specimen: Nasal Mucosa; Nasopharyngeal  Result Value Ref Range Status   MRSA by PCR NEGATIVE NEGATIVE Final    Comment:        The GeneXpert MRSA Assay (FDA approved for NASAL specimens only), is one component of a comprehensive MRSA colonization surveillance program. It is not intended to diagnose MRSA infection nor to guide or monitor treatment for MRSA infections. Performed at [facility], [place] [address], [place],  [address]   Novel Coronavirus, NAA (hospital order; send-out to ref lab)     Status: Abnormal   Collection Time: 08/12/18  9:59 AM   Specimen: Nasopharyngeal Swab; Respiratory  Result Value Ref Range Status   SARS-CoV-2, NAA DETECTED (A) NOT DETECTED Final    Comment: (NOTE)                  Client Requested Flag This test was developed and its performance characteristics  determined by [employer]. This test has not been FDA cleared or approved. This test has been authorized by FDA under an Emergency Use Authorization (EUA). This test is only authorized for the duration of time the declaration that circumstances exist justifying the authorization of the emergency use of in vitro diagnostic tests for detection of SARS-CoV-2 virus and/or diagnosis of COVID-19 infection under section 564(b)(1) of the Act, 21 U.S.C. 734LPF-7(T)(0), unless the authorization is terminated or revoked sooner. When diagnostic testing is negative,  the possibility of a false negative result should be considered in the context of a patient's recent exposures and the presence of clinical signs and symptoms consistent with COVID-19. An individual without symptoms of COVID-19 and who is not shedding SARS-CoV-2 virus would expect to have a negative (not det ected) result in this assay. Performed At: [facility] [place], [place] [id number] Rush Farmer MD TE:[id number]    [place]  Final    Comment: Performed at [place] [address] [place], [place] [address]    Radiology Reports Dg Chest Portable 1 View  Result Date: 08/06/2018 CLINICAL DATA:  83 year old female with cough. EXAM: PORTABLE CHEST 1 VIEW COMPARISON:  Chest radiograph dated 12/13/2017 FINDINGS: The patient is tilted to the left. Minimal left lung base atelectatic changes. No focal consolidation, or pleural effusion. Thin lucency along the upper mediastinum to the left of midline, likely artifact. A pneumothorax is less likely. The cardiac silhouette is within normal limits. No acute osseous pathology. IMPRESSION: No active disease. Electronically Signed   By: Anner Crete M.D.   On: 08/06/2018 21:27

## 2018-08-16 NOTE — Progress Notes (Signed)
Patient remains restless but more able to redirect activity. Requiring 1:1 supervision due to not being able to keep from leaving room without mask when left.

## 2018-08-16 NOTE — TOC Progression Note (Signed)
Transition of Care [facility]) - Progression Note    Patient Details  Name: Vicki Garcia MRN: [id number] Date of Birth: 12-17-1935  [facility] CM/SW Contact  Loletha Grayer Beverely Pace, RN Phone Number: [phone] (working remotely) 08/16/2018, 10:18 AM  Clinical Narrative:   Case manager soke with Dr. Candiss Norse via chat on 08/15/18 concerning patient disposition. Patient's daughter is adamant that she return to [facility]. Case manager left message this am for daughter, Langley Gauss this am. PT reccomends that patient return to her Memory Care Unit when able, she will not benefit from SNF.  Case manager will continue to monitor for negative tests.    Expected Discharge Plan: Assisted Living Barriers to Discharge: Continued Medical Work up(need negative COVID  test to return to [facility])  Expected Discharge Plan and Services Expected Discharge Plan: Assisted Living     Post Acute Care Choice: ([facility]) Living arrangements for the past 2 months: [place] Expected Discharge Date: 08/15/18                                     Social Determinants of Health (SDOH) Interventions    Readmission Risk Interventions No flowsheet data found.

## 2018-08-16 NOTE — Progress Notes (Signed)
Telephone call to patient's daughter, Kathi Ludwig, updated on plan of care for shift and answered all questions.

## 2018-08-17 MED ORDER — HALOPERIDOL LACTATE 5 MG/ML IJ SOLN
2.0000 mg | Freq: Four times a day (QID) | INTRAMUSCULAR | Status: DC | PRN
  Administered 2018-08-17 – 2018-08-18 (×3): 2 mg via INTRAMUSCULAR
  Filled 2018-08-17 (×4): qty 1

## 2018-08-17 NOTE — Progress Notes (Signed)
Spoke with patient's daughter, Langley Gauss, with patient and gave update. She really appreciated the call and the update.

## 2018-08-17 NOTE — Progress Notes (Signed)
All information including fl2 and dc summary faxed to [facility], they report they are working on setting up 1:1 sitter for patient at facility but are having trouble to find someone that will sit with covid patient, CSW attempting to assist them with identifying this as well.   Once this is resolved patient can dc back to [facility].   [place], [place]

## 2018-08-17 NOTE — Progress Notes (Signed)
Patient's daughter called back, updated on plan of care for shift and answered all questions.

## 2018-08-17 NOTE — Progress Notes (Signed)
Spent last 45 min in room with patient.  Attempted to give pills crushed in apple sauce and pt refused, spitting them out.  Haldol IM PRN was given.  Pt confused, talking in circles, rambling, wanting to get out of room.  Safety attendant within arms reach.  Pt placed in bed, ready to sleep, with bed alarm on.

## 2018-08-17 NOTE — Progress Notes (Signed)
PROGRESS NOTE                                                                                                                                                                                                             Patient Demographics:    Vicki Garcia, is a 83 y.o. female, DOB - [birth year], YYT:[id number]  Outpatient Primary MD for the patient is Janith Lima, MD    LOS - 0  Admit date - 08/06/2018    Chief Complaint  Patient presents with  . Cough       Brief Narrative   Vicki Garcia  is a 83 y.o. female, w hypertension, dementia with behavioral disburbance, apparently presents with cough, and agitation, and is covid +.  Her facility [facility] will not take her back covid +. Pt is also agitated and they have not sitter at [facility] which is an ALF and therefore pt will require admission due to behavioral disturbance as well.  Pt is unable to provide any history. No complaints of fever, chills, cp, palp, sob, n/v, diarrhea, brbpr, black stool.   Patient is medically stable and she was discharged on 08/15/2018.  We await bed for placement.  She will likely remain positive for COVID for the next 4 to 6 weeks and arrangements have to be made accordingly.  Social work has been consulted several days ago.    Subjective:   Patient in bed, calm at this time and resting comfortably, denies any discomfort.  No shortness of breath.   Assessment  & Plan :     1.  Acute Covid 19 Viral Pneumonitis during the ongoing 2020 Covid 19 Pandemic -  Stable for now on room air, inflammatory markers relatively stable, continue to monitor her.  She will require placement.   Per family request  second COVID test was ordered on 08/12/2018 which is positive on 08/14/2018 as expected.   COVID-19 Labs  No results for input(s): DDIMER, FERRITIN, LDH, CRP in the last 72 hours.  Lab Results  Component Value Date   SARSCOV2NAA DETECTED (A)  08/12/2018   SARSCOV2NAA POSITIVE (A) 08/06/2018   [place] Not Detected 07/06/2018     Hepatic Function Latest Ref Rng & Units 08/13/2018 08/12/2018 08/11/2018  Total Protein 6.5 - 8.1 g/dL 5.9(L) 5.9(L) 5.8(L)  Albumin 3.5 - 5.0 g/dL 3.3(L) 3.4(L) 3.5  AST 15 - 41 U/L 39 38 34  ALT 0 - 44 U/L 39 34 31  Alk Phosphatase 38 - 126 U/L 56 54 50  Total Bilirubin 0.3 - 1.2 mg/dL 0.3 0.2(L) 0.2(L)  Bilirubin, Direct 0.0 - 0.3 mg/dL - - -        Component Value Date/Time   BNP 31.1 08/13/2018 0330      2.  Advanced dementia.  Does have some hospital-acquired delirium, encephalopathy which is nonspecific and likely due to being in the unfamiliar setting.  PRN IV Haldol, will add nighttime Seroquel at the lowest possible dose, fall precautions, remote camera sitter.  Will also involve PT OT, advance activity exposed to sunlight to see if it helps.  Speech therapy also to assist.  For now soft diet with feeding assistance and aspiration precautions.  Baseline quality of life appears to be quite poor.  Note patient is still requiring assistance to be fed and remains at risk for fall due to delirium and baseline dementia.  3.  Hypertension.  Blood pressure is stable continue Norvasc and as needed IV hydralazine.  4.  History of iron deficiency anemia.  Continue oral iron supplements.      Condition - Fair  Family Communication  :  Daughter on 08/10/18 x 2, explained making her mom DNR, explained her 2nd time, she agrees.  Updated again on 08/12/2018.  Also told that second COVID test was ordered on 08/12/2018 but highly unlikely to be negative.  Test usually stays positive for 6 to 8 weeks.  Code Status :  DNR  Diet :    Diet Order            DIET SOFT Room service appropriate? Yes; Fluid consistency: Thin  Diet effective now               Disposition Plan  :  SNF once bed available, was discharged on 08/15/2018.  Consults  :  None  Procedures  :  None  PUD Prophylaxis :     DVT Prophylaxis  :   Heparin    Lab Results  Component Value Date   PLT 214 08/13/2018    Inpatient Medications  Scheduled Meds: . acetaminophen  1,000 mg Oral QHS  . benzonatate  100 mg Oral TID  . citalopram  10 mg Oral Daily  . donepezil  5 mg Oral QHS  . famotidine  40 mg Oral Daily  . feeding supplement (ENSURE ENLIVE)  237 mL Oral BID BM  . ferrous sulfate  325 mg Oral Q breakfast  . heparin injection (subcutaneous)  5,000 Units Subcutaneous Q8H  . latanoprost  1 drop Both Eyes QHS  . Melatonin  3 mg Oral QHS  . QUEtiapine  50 mg Oral BID  . vitamin C  500 mg Oral Daily  . zinc sulfate  220 mg Oral Daily   Continuous Infusions: PRN Meds:.acetaminophen, albuterol, haloperidol lactate, hydrALAZINE  Antibiotics  :    Anti-infectives (From admission, onward)   None       Time Spent in minutes  30   Lala Lund M.D on 08/17/2018 at 9:34 AM  To page go to [email or web address] - password [email or web address]  [facility] -  Office  [phone]  See all Orders from today for further details    Objective:   Vitals:   08/16/18 1700 08/16/18 2010 08/17/18 0350 08/17/18 0729  BP: (!) 164/55 (!) 153/67 (!) 129/58 137/62  Pulse: 69 83 62 74  Resp:  18 18 16   Temp: (!) 97.3 F (36.3 C) 99.2 F (37.3 C) 98.9 F (37.2 C) 98.3 F (36.8 C)  TempSrc: Oral Oral Axillary Oral  SpO2: 100% 92% 90% 99%  Weight:      Height:        Wt Readings from Last 3 Encounters:  08/07/18 45.8 kg  07/06/18 44.9 kg  03/14/18 44.9 kg     Intake/Output Summary (Last 24 hours) at 08/17/2018 0934 Last data filed at 08/16/2018 1800 Gross per 24 hour  Intake 220 ml  Output -  Net 220 ml     Physical Exam  Awake, calm but confused, no focal neurological deficits, moving all 4 extremities, [place].AT,PERRAL Supple Neck,No JVD, No cervical lymphadenopathy appriciated.  Symmetrical Chest wall movement, Good air movement bilaterally, CTAB RRR,No Gallops, Rubs or new Murmurs, No Parasternal  Heave +ve B.Sounds, Abd Soft, No tenderness, No organomegaly appriciated, No rebound - guarding or rigidity. No Cyanosis, Clubbing or edema, No new Rash or bruise    Data Review:    CBC  Recent Labs  Lab 08/11/18 0215 08/12/18 0350 08/13/18 0330  WBC 3.2* 3.0* 3.2*  HGB 11.3* 11.4* 11.5*  HCT 34.9* 35.6* 35.2*  PLT 209 209 214  MCV 93.1 94.4 92.9  MCH 30.1 30.2 30.3  MCHC 32.4 32.0 32.7  RDW 12.0 12.1 12.0  LYMPHSABS 1.2 1.2 1.2  MONOABS 0.8 0.7 0.6  EOSABS 0.0 0.0 0.1  BASOSABS 0.0 0.0 0.0    Chemistries    Recent Labs  Lab 08/11/18 0215 08/12/18 0350 08/13/18 0330  NA 137 139 138  K 3.7 3.8 3.7  CL 97* 99 99  CO2 28 30 29   GLUCOSE 86 91 100*  BUN 24* 21 23  CREATININE 0.78 0.76 0.61  CALCIUM 9.0 8.9 8.7*  MG 2.1 2.2 2.0  AST 34 38 39  ALT 31 34 39  ALKPHOS 50 54 56  BILITOT 0.2* 0.2* 0.3   ------------------------------------------------------------------------------------------------------------------ No results for input(s): CHOL, HDL, LDLCALC, TRIG, CHOLHDL, LDLDIRECT in the last 72 hours.  Lab Results  Component Value Date   HGBA1C 5.4 04/27/2017   ------------------------------------------------------------------------------------------------------------------ No results for input(s): TSH, T4TOTAL, T3FREE, THYROIDAB in the last 72 hours.  Invalid input(s): FREET3  Cardiac Enzymes No results for input(s): CKMB, TROPONINI, MYOGLOBIN in the last 168 hours.  Invalid input(s): CK ------------------------------------------------------------------------------------------------------------------    Component Value Date/Time   BNP 31.1 08/13/2018 0330    Micro Results Recent Results (from the past 240 hour(s))  MRSA PCR Screening     Status: None   Collection Time: 08/07/18 11:53 AM   Specimen: Nasal Mucosa; Nasopharyngeal  Result Value Ref Range Status   MRSA by PCR NEGATIVE NEGATIVE Final    Comment:        The GeneXpert MRSA Assay (FDA  approved for NASAL specimens only), is one component of a comprehensive MRSA colonization surveillance program. It is not intended to diagnose MRSA infection nor to guide or monitor treatment for MRSA infections. Performed at [facility], [place] [address] [place], [place] [address]   Novel Coronavirus, NAA (hospital order; send-out to ref lab)     Status: Abnormal   Collection Time: 08/12/18  9:59 AM   Specimen: Nasopharyngeal Swab; Respiratory  Result Value Ref Range Status   SARS-CoV-2, NAA DETECTED (A) NOT DETECTED Final    Comment: (NOTE)                  Client Requested Flag This test  was developed and its performance characteristics determined by [employer]. This test has not been FDA cleared or approved. This test has been authorized by FDA under an Emergency Use Authorization (EUA). This test is only authorized for the duration of time the declaration that circumstances exist justifying the authorization of the emergency use of in vitro diagnostic tests for detection of SARS-CoV-2 virus and/or diagnosis of COVID-19 infection under section 564(b)(1) of the Act, 21 U.S.C. 350VDP-3(A)(2), unless the authorization is terminated or revoked sooner. When diagnostic testing is negative, the possibility of a false negative result should be considered in the context of a patient's recent exposures and the presence of clinical signs and symptoms consistent with COVID-19. An individual without symptoms of COVID-19 and who is not shedding SARS-CoV-2 virus would expect to have a negative (not det ected) result in this assay. Performed At: [facility] [place], [place] [id number] Rush Farmer MD KI:[id number]    [place]  Final    Comment: Performed at [facility] [address] [place], [place] [address]    Radiology Reports Dg Chest Portable 1 View  Result Date: 08/06/2018  CLINICAL DATA:  83 year old female with cough. EXAM: PORTABLE CHEST 1 VIEW COMPARISON:  Chest radiograph dated 12/13/2017 FINDINGS: The patient is tilted to the left. Minimal left lung base atelectatic changes. No focal consolidation, or pleural effusion. Thin lucency along the upper mediastinum to the left of midline, likely artifact. A pneumothorax is less likely. The cardiac silhouette is within normal limits. No acute osseous pathology. IMPRESSION: No active disease. Electronically Signed   By: Anner Crete M.D.   On: 08/06/2018 21:27

## 2018-08-17 NOTE — NC FL2 (Addendum)
[place] LEVEL OF CARE SCREENING TOOL     IDENTIFICATION  Patient Name: Vicki Garcia Birthdate: 27-Sep-1935 Sex: female Admission Date (Current Location): 08/06/2018  [facility] and [place] Number:  Herbalist and Address:  [facility], [place] [address] [place], [place] [address]      Provider Number: [id number]  Attending Physician Name and Address:  Edwin Dada, MD  Relative Name and Phone Number:  [phone]    Current Level of Care: Hospital Recommended Level of Care: [place] Prior Approval Number:    Date Approved/Denied:   PASRR Number: [id number]  Discharge Plan: Other (Comment)([facility])    Current Diagnoses: Patient Active Problem List   Diagnosis Date Noted  . Coronavirus infection 08/09/2018  . COVID-19 virus infection 08/07/2018  . Acute respiratory disease due to COVID-19 virus 08/07/2018  . Routine general medical examination at a health care facility 03/14/2018  . Iron deficiency anemia due to dietary causes 05/05/2017  . Chronic cough 03/06/2016  . Senile dementia with behavioral disturbance ([place]) 10/24/2014  . Benign esophageal stricture 07/30/2014  . Seasonal and perennial allergic rhinitis 10/29/2008  . MELANOMA, UPPER ARM 04/04/2008  . Essential hypertension 02/22/2007  . Hyperlipidemia LDL goal <160 02/18/2007    Orientation RESPIRATION BLADDER Height & Weight     Self  Normal Continent Weight: 100 lb 15.5 oz (45.8 kg) Height:  5' (152.4 cm)  BEHAVIORAL SYMPTOMS/MOOD NEUROLOGICAL BOWEL NUTRITION STATUS      Continent Regular Texture Diet  AMBULATORY STATUS COMMUNICATION OF NEEDS Skin   Limited Assist Verbally Normal                       Personal Care Assistance Level of Assistance  Bathing, Feeding, Dressing Bathing Assistance: Limited assistance Feeding assistance: Independent Dressing Assistance: Limited assistance      Functional Limitations Info  Sight, Hearing, Speech Sight Info: Adequate Hearing Info: Adequate Speech Info: Adequate    SPECIAL CARE FACTORS FREQUENCY                       Contractures Contractures Info: Not present    Additional Factors Info  Code Status, Allergies, Isolation Precautions Code Status Info: DNR Allergies Info: Codeine Namzaric (Memantine Hcl-donepezil Hcl) Norvasc (Amlodipine Besylate)Vasotec     Isolation Precautions Info: emerging pathogen, air and contact precautions         Discharge Medications: Please see discharge summary for a list of discharge medications. STOP taking these medications   LORAZEPAM PO     TAKE these medications   citalopram 10 MG tablet Commonly known as: CELEXA TAKE 1 TABLET ONCE DAILY.   donepezil 5 MG tablet Commonly known as: ARICEPT Take 1 tablet (5 mg total) by mouth at bedtime.   feeding supplement (ENSURE ENLIVE) Liqd Take 237 mLs by mouth 2 (two) times daily between meals.   ferrous sulfate 325 (65 FE) MG tablet Take 1 tablet (325 mg total) by mouth 2 (two) times daily with a meal. What changed: when to take this   latanoprost 0.005 % ophthalmic solution Commonly known as: XALATAN Place 1 drop into both eyes at bedtime.   losartan-hydrochlorothiazide 100-12.5 MG tablet Commonly known as: HYZAAR TAKE 1 TABLET ONCE DAILY.   Melatonin 3 MG Tabs Take 3 mg by mouth at bedtime.   QUEtiapine 50 MG tablet Commonly known as: SEROQUEL Take 1 tablet (50 mg total) by mouth 2 (two)  times daily.        Relevant Imaging Results:  Relevant Lab Results:   Additional Information [id number]  Alberteen Sam, LCSW

## 2018-08-17 NOTE — Progress Notes (Signed)
Telephone call to patient's daughter, no answer, will try again later.

## 2018-08-18 MED ORDER — LORAZEPAM 2 MG/ML IJ SOLN
0.5000 mg | Freq: Once | INTRAMUSCULAR | Status: DC
  Filled 2018-08-18: qty 1

## 2018-08-18 NOTE — Progress Notes (Addendum)
MD messaged per pt agitation, attempts to wander room, uncooperative and non-compliant. MD approved verbally over phone to give PRN Haldol now, knowing it is 45 min earlier than MAR states.

## 2018-08-18 NOTE — Plan of Care (Signed)
  Problem: Activity: Goal: Risk for activity intolerance will decrease Outcome: Progressing   Problem: Nutrition: Goal: Adequate nutrition will be maintained Outcome: Progressing   Problem: Coping: Goal: Level of anxiety will decrease Outcome: Progressing   Problem: Elimination: Goal: Will not experience complications related to bowel motility Outcome: Progressing   Problem: Education: Goal: Knowledge of General Education information will improve Description: Including pain rating scale, medication(s)/side effects and non-pharmacologic comfort measures Outcome: Not Progressing   Problem: Health Behavior/Discharge Planning: Goal: Ability to manage health-related needs will improve Outcome: Not Progressing

## 2018-08-18 NOTE — Progress Notes (Signed)
Daughter Langley Gauss called and was updated of night shift events, Mother's refusal to take HS meds and administration of PRN IM Haldol.

## 2018-08-18 NOTE — Progress Notes (Signed)
This is another no charge note.  The patient was seen and examined and her chart was reviewed thoroughly since my last evaluation.  She was transferred from her memory care facility for SARS-CoV-2 testing.  She tested positive, but because of lack of isolation protocols at her facility, could not be discharged home.  She was placed in observation status due to this inability to safely disposition her from the ER home.  In the meantime social work and hospital ministration have been working towards a safe discharge plan, but is been unsuccessful.  She was continued on her home medications.       Brief Narrative:  Vicki Garcia is a 83 y.o. F with advanced dementia in memory Care at [facility] and HTN who presented with cough.  In the ER, respiratory rate normal, O2 sats 100% on room air, CXR clear.          Assessment & Plan:  Coronavirus infection without lung involvement In setting of ongoing 2020 COVID-19 pandemic. At no point has the patient had hypoxia or acute hypoxic respiratory failure.  Previous documentation suggesting the latter is incorrect.  She has no indication for anti-inflammatories nor antivirals. -Reasonable to check pulse oximetry daily -No DVT prophylaxis for this outpatient   Hypertension Blood pressure low normal off home HCTZ and losartan  Iron deficiency -Continue iron supplement  Dementia with behavioral disturbance On my evaluation, the patient has no delirium.  She appears to have dementia, and to be agitated due to unfamiliar circumstances.  It is unlikely that there has been a component of delirium or encephalopathy at any point during her illness; she appears to be demonstrating normal expected behaviors for a patient with advanced dementia in her present situation. -Continue citalopram, donepezil -Continue melatonin -Continue Seroquel  Hyponatremia Resolved      Edwin Dada, MD [facility] 08/18/2018, 4:21 PM      Please page through [facility]:  [email or web address] Password [email or web address] If 7PM-7AM, please contact night-coverage

## 2018-08-18 NOTE — Progress Notes (Signed)
CSW spoke with [employer] who reports she is following up today on if they can accept patient back or need to wait until she reaches 14 days at the hospital for her to return off of precaution and have a 1:1 sitter (patient previously had 1:! sitter prior to coming to hospital). She reports she will have an update by end of day.   [place], [place]

## 2018-08-19 NOTE — TOC Transition Note (Signed)
Transition of Care [facility]) - CM/SW Discharge Note   Patient Details  Name: Vicki Garcia MRN: [id number] Date of Birth: 22-Feb-1935  Transition of Care [facility]) CM/SW Contact:  Ninfa Meeker, RN Phone Number: 08/19/2018, 2:23 PM   Clinical Narrative:   Patient will DC to [facility] Anticipated DC date: 08/19/18 Family notified : Daughter- Kathi Ludwig [phone] Transport by: Corey Harold      Case manager asked Bedside RN to call report to  [facility] [phone]. [facility] called for transport at 2:33pm.     Final next level of care: Assisted Living Barriers to Discharge: No Barriers Identified   Patient Goals and CMS Choice   CMS Medicare.gov Compare Post Acute Care list provided to:: Patient Represenative (must comment)(Daughter) Choice offered to / list presented to : Adult Children  Discharge Placement                Patient to be transferred to facility by: [facility] Name of family member notified: Daughter: Kathi Ludwig   [phone] Patient and family notified of of transfer: 08/19/18  Discharge Plan and Services     Post Acute Care Choice: ([facility])          DME Arranged: N/A         HH Arranged: NA          Social Determinants of Health (SDOH) Interventions     Readmission Risk Interventions No flowsheet data found.

## 2018-08-19 NOTE — Plan of Care (Signed)
  Problem: Nutrition: Goal: Adequate nutrition will be maintained Outcome: Progressing   Problem: Safety: Goal: Ability to remain free from injury will improve Outcome: Progressing   Problem: Skin Integrity: Goal: Risk for impaired skin integrity will decrease Outcome: Progressing   

## 2018-08-19 NOTE — Plan of Care (Signed)
  Problem: Education: Goal: Knowledge of General Education information will improve Description: Including pain rating scale, medication(s)/side effects and non-pharmacologic comfort measures Outcome: Adequate for Discharge   Problem: Health Behavior/Discharge Planning: Goal: Ability to manage health-related needs will improve Outcome: Adequate for Discharge   Problem: Activity: Goal: Risk for activity intolerance will decrease Outcome: Adequate for Discharge   Problem: Nutrition: Goal: Adequate nutrition will be maintained 08/19/2018 1448 by Phyllis Ginger, RN Outcome: Adequate for Discharge 08/19/2018 0839 by Phyllis Ginger, RN Outcome: Progressing   Problem: Coping: Goal: Level of anxiety will decrease Outcome: Adequate for Discharge   Problem: Elimination: Goal: Will not experience complications related to bowel motility Outcome: Adequate for Discharge   Problem: Safety: Goal: Ability to remain free from injury will improve 08/19/2018 1448 by Phyllis Ginger, RN Outcome: Adequate for Discharge 08/19/2018 0839 by Phyllis Ginger, RN Outcome: Progressing   Problem: Skin Integrity: Goal: Risk for impaired skin integrity will decrease 08/19/2018 1448 by Phyllis Ginger, RN Outcome: Adequate for Discharge 08/19/2018 0839 by Phyllis Ginger, RN Outcome: Progressing   Problem: Education: Goal: Knowledge of risk factors and measures for prevention of condition will improve Outcome: Adequate for Discharge   Problem: Coping: Goal: Psychosocial and spiritual needs will be supported Outcome: Adequate for Discharge

## 2018-08-19 NOTE — Progress Notes (Signed)
Report called to Turner at [facility].  Transport has been notified

## 2018-08-19 NOTE — Progress Notes (Signed)
Spoke with daughter Langley Gauss. Updated on patient condition and plan of care. All questions answered.

## 2018-08-19 NOTE — Discharge Summary (Signed)
Physician Discharge Summary  Vicki Garcia GEZ:[id number] DOB: 1935-10-15 DOA: 08/06/2018  PCP: Janith Lima, MD  Admit date: 08/06/2018 Discharge date: 08/17/2018  Admitted From: Memory Care  Disposition:  Memory Care   Recommendations for Outpatient Follow-up:  1. Check BP once weekly and resume HCTZ or losartan if needed      Home Health: N/A  Equipment/Devices: TBD at Memory care unit  Discharge Condition: At baseline  CODE STATUS: DO NOT RESUSCITATE Diet recommendation: No change from prior to admission  Brief/Interim Summary: Vicki Garcia is a 83 y.o. F with advanced dementia in memory Care at [facility] and HTN who presented with cough.  In the ER, respiratory rate normal, O2 sats 100% on room air, CXR clear.  She tested positive for SARS-CoV-2 by rtPCR.          PRINCIPAL HOSPITAL DIAGNOSIS: Coronavirus infection without acute hypoxic respiratory failure or pneumonia or encephalopathy    Discharge Diagnoses:    Coronavirus infection without lung involvement In setting of ongoing 2020 COVID-19 pandemic.  Evidently, a co-resident at her facility developed symptoms of COVID and tested positive for SARS-CoV-2.  This patient then developed a cough on or around 7/18 and was transferred from her memory care facility to our ER for evaluation.  The patient tested positive for SARS-CoV-2 but had only mild symptoms without pneumonia, acute hypoxic respiratory failure of change in mentation.  Because of lack of isolation protocols at her facility, she could not be discharged home.  She was placed in observation status due to this inability to safely disposition her from the ER, she was continued on her home medications.  At no point has the patient had hypoxia or acute hypoxic respiratory failure. Respiratory failure, sepsis and pneumonia and encephalopathy were all ruled out.  She had no indication for anti-inflammatories nor antivirals.  She was isolated for 10 days  starting 7/18 per CDC guidelines.  Her cough is resolved. Her isolation is now complete, and she has no personal health conditions or treatments for which she requires prolonged self-isolation.     Hypertension Blood pressure was low normal and HCTZ and losartan were stopped.  These may be revisited by the patient's PCP as needed.     Chronic iron deficiency  Dementia with behavioral disturbance The patient had no delirium or acute metabolic encephalopathy from her coronavirus infection.  She has advanced dementia, is documented to have dementia with behavioral disturbance, and appeared to be agitated due to unfamiliar circumstances.  There did not appear to be any component of delirium or encephalopathy at any point during her illness.  She appeared to be demonstrating normal expected behaviors for a patient with advanced dementia in her present situation.                Discharge Instructions  Discharge Instructions    Discharge instructions   Complete by: As directed    Discharge patient   Complete by: As directed    Discharge disposition: 03-Skilled [place]   Discharge patient date: 08/17/2018   Increase activity slowly   Complete by: As directed      Allergies as of 08/19/2018      Reactions   Codeine Other (See Comments)   REACTION: violently ill with N&V   Namzaric [memantine Hcl-donepezil Hcl] Nausea And Vomiting   Pt reports illness. .   Norvasc [amlodipine Besylate] Nausea And Vomiting   Vasotec Other (See Comments)   cough      Medication List  STOP taking these medications   LORAZEPAM PO   losartan-hydrochlorothiazide 100-12.5 MG tablet Commonly known as: HYZAAR     TAKE these medications   citalopram 10 MG tablet Commonly known as: CELEXA TAKE 1 TABLET ONCE DAILY.   donepezil 5 MG tablet Commonly known as: ARICEPT Take 1 tablet (5 mg total) by mouth at bedtime.   feeding supplement (ENSURE ENLIVE) Liqd Take 237 mLs by mouth 2  (two) times daily between meals.   ferrous sulfate 325 (65 FE) MG tablet Take 1 tablet (325 mg total) by mouth 2 (two) times daily with a meal. What changed: when to take this   latanoprost 0.005 % ophthalmic solution Commonly known as: XALATAN Place 1 drop into both eyes at bedtime.   Melatonin 3 MG Tabs Take 3 mg by mouth at bedtime.      Follow-up Information    Janith Lima, MD. Schedule an appointment as soon as possible for a visit today.   Specialty: Internal Medicine Contact information: [address] [place] [address] [phone]          Allergies  Allergen Reactions  . Codeine Other (See Comments)    REACTION: violently ill with N&V  . Namzaric [Memantine Hcl-Donepezil Hcl] Nausea And Vomiting    Pt reports illness. .  Josem Kaufmann [Amlodipine Besylate] Nausea And Vomiting  . Vasotec Other (See Comments)    cough    Consultations:  None   Procedures/Studies: Dg Chest Portable 1 View  Result Date: 08/06/2018 CLINICAL DATA:  83 year old female with cough. EXAM: PORTABLE CHEST 1 VIEW COMPARISON:  Chest radiograph dated 12/13/2017 FINDINGS: The patient is tilted to the left. Minimal left lung base atelectatic changes. No focal consolidation, or pleural effusion. Thin lucency along the upper mediastinum to the left of midline, likely artifact. A pneumothorax is less likely. The cardiac silhouette is within normal limits. No acute osseous pathology. IMPRESSION: No active disease. Electronically Signed   By: Anner Crete M.D.   On: 08/06/2018 21:27      Subjective: Unable to assess.  Sitter reports patient appears comfortable.  Wanders occasionally, but is in no distress.  No cough or respiratory distress.  Discharge Exam: Vitals:   08/19/18 0524 08/19/18 0755  BP: 120/67 (!) 101/48  Pulse: 61 85  Resp:  18  Temp: 98.2 F (36.8 C)   SpO2: 99% 98%   Vitals:   08/18/18 1551 08/18/18 2008 08/19/18 0524 08/19/18 0755  BP: (!)  144/66 (!) 157/71 120/67 (!) 101/48  Pulse: 82 88 61 85  Resp: 18 17  18   Temp: 98.1 F (36.7 C) 98 F (36.7 C) 98.2 F (36.8 C)   TempSrc: Oral Oral Axillary   SpO2: 99% 98% 99% 98%  Weight:      Height:        General: Pt is sleeping comfortably, she rouses to touch.  She makes no spontaneous verbalizations but she cooperates with exam.    Cardiovascular: RRR, nl S1-S2, no murmurs appreciated.   No LE edema.   Respiratory: Normal respiratory rate and rhythm.  CTAB without rales or wheezes. Abdominal: Abdomen soft and non-tender.  No distension or HSM.   Neuro/Psych: Follows some commands, not others.  No intelligible speech to me.  Does not answer questions.  Apepars to make eye contact.   The results of significant diagnostics from this hospitalization (including imaging, microbiology, ancillary and laboratory) are listed below for reference.     Microbiology: Recent Results (from  the past 240 hour(s))  Novel Coronavirus, NAA (hospital order; send-out to ref lab)     Status: Abnormal   Collection Time: 08/12/18  9:59 AM   Specimen: Nasopharyngeal Swab; Respiratory  Result Value Ref Range Status   SARS-CoV-2, NAA DETECTED (A) NOT DETECTED Final    Comment: (NOTE)                  Client Requested Flag This test was developed and its performance characteristics determined by [employer]. This test has not been FDA cleared or approved. This test has been authorized by FDA under an Emergency Use Authorization (EUA). This test is only authorized for the duration of time the declaration that circumstances exist justifying the authorization of the emergency use of in vitro diagnostic tests for detection of SARS-CoV-2 virus and/or diagnosis of COVID-19 infection under section 564(b)(1) of the Act, 21 U.S.C. 696EXB-2(W)(4), unless the authorization is terminated or revoked sooner. When diagnostic testing is negative, the possibility of a false negative result should be  considered in the context of a patient's recent exposures and the presence of clinical signs and symptoms consistent with COVID-19. An individual without symptoms of COVID-19 and who is not shedding SARS-CoV-2 virus would expect to have a negative (not det ected) result in this assay. Performed At: [facility] [place], [place] [id number] Rush Farmer MD VO:[id number]    [place]  Final    Comment: Performed at [facility] [address] [place], [place] [address]     Labs: BNP (last 3 results) Recent Labs    08/11/18 0210 08/12/18 0350 08/13/18 0330  BNP 17.5 34.6 63.8   Basic Metabolic Panel: Recent Labs  Lab 08/13/18 0330  NA 138  K 3.7  CL 99  CO2 29  GLUCOSE 100*  BUN 23  CREATININE 0.61  CALCIUM 8.7*  MG 2.0   Liver Function Tests: Recent Labs  Lab 08/13/18 0330  AST 39  ALT 39  ALKPHOS 56  BILITOT 0.3  PROT 5.9*  ALBUMIN 3.3*   No results for input(s): LIPASE, AMYLASE in the last 168 hours. No results for input(s): AMMONIA in the last 168 hours. CBC: Recent Labs  Lab 08/13/18 0330  WBC 3.2*  NEUTROABS 1.3*  HGB 11.5*  HCT 35.2*  MCV 92.9  PLT 214   Cardiac Enzymes: No results for input(s): CKTOTAL, CKMB, CKMBINDEX, TROPONINI in the last 168 hours. BNP: Invalid input(s): POCBNP CBG: No results for input(s): GLUCAP in the last 168 hours. D-Dimer No results for input(s): DDIMER in the last 72 hours. Hgb A1c No results for input(s): HGBA1C in the last 72 hours. Lipid Profile No results for input(s): CHOL, HDL, LDLCALC, TRIG, CHOLHDL, LDLDIRECT in the last 72 hours. Thyroid function studies No results for input(s): TSH, T4TOTAL, T3FREE, THYROIDAB in the last 72 hours.  Invalid input(s): FREET3 Anemia work up No results for input(s): VITAMINB12, FOLATE, FERRITIN, TIBC, IRON, RETICCTPCT in the last 72 hours. Urinalysis    Component Value Date/Time   COLORURINE  YELLOW 04/26/2017 1614   APPEARANCEUR HAZY (A) 04/26/2017 1614   LABSPEC 1.010 04/26/2017 1614   PHURINE 5.0 04/26/2017 1614   GLUCOSEU NEGATIVE 04/26/2017 1614   GLUCOSEU NEGATIVE 11/11/2015 1058   HGBUR SMALL (A) 04/26/2017 1614   BILIRUBINUR NEGATIVE 04/26/2017 1614   KETONESUR NEGATIVE 04/26/2017 1614   PROTEINUR NEGATIVE 04/26/2017 1614   UROBILINOGEN 0.2 11/11/2015 1058   NITRITE NEGATIVE 04/26/2017 1614   LEUKOCYTESUR TRACE (A)  04/26/2017 1614   Sepsis Labs Invalid input(s): PROCALCITONIN,  WBC,  LACTICIDVEN Microbiology Recent Results (from the past 240 hour(s))  Novel Coronavirus, NAA (hospital order; send-out to ref lab)     Status: Abnormal   Collection Time: 08/12/18  9:59 AM   Specimen: Nasopharyngeal Swab; Respiratory  Result Value Ref Range Status   SARS-CoV-2, NAA DETECTED (A) NOT DETECTED Final    Comment: (NOTE)                  Client Requested Flag This test was developed and its performance characteristics determined by [employer]. This test has not been FDA cleared or approved. This test has been authorized by FDA under an Emergency Use Authorization (EUA). This test is only authorized for the duration of time the declaration that circumstances exist justifying the authorization of the emergency use of in vitro diagnostic tests for detection of SARS-CoV-2 virus and/or diagnosis of COVID-19 infection under section 564(b)(1) of the Act, 21 U.S.C. 161WRU-0(A)(5), unless the authorization is terminated or revoked sooner. When diagnostic testing is negative, the possibility of a false negative result should be considered in the context of a patient's recent exposures and the presence of clinical signs and symptoms consistent with COVID-19. An individual without symptoms of COVID-19 and who is not shedding SARS-CoV-2 virus would expect to have a negative (not det ected) result in this assay. Performed At: [facility] [place], [place] [id number] Rush Farmer MD NW:[id number]    [place]  Final    Comment: Performed at [facility] [address], [place], [place] [address]     Time coordinating discharge: 25 minutes      SIGNED:   Edwin Dada, MD  [facility] 08/19/2018, 11:13 AM

## 2018-08-26 ENCOUNTER — Telehealth: Payer: Self-pay | Admitting: Internal Medicine

## 2018-08-26 NOTE — Telephone Encounter (Signed)
Copied from [place] [id number]. Topic: Quick Communication - Rx Refill/Question >> Aug 26, 2018  1:16 PM Erick Blinks wrote: Medication: Lorazepam 46ml 0.5 Topical - refill request needs to be formatted with written Rx and details -Toria called from [facility] to clarify. Verbal order can be called she says  Has the patient contacted their pharmacy? Yes.   (Agent: If no, request that the patient contact the pharmacy for the refill.) (Agent: If yes, when and what did the pharmacy advise?)  Preferred Pharmacy (with phone number or street name): [facility], [address] [place] [address] Phone: [phone] Fax: [phone]    Agent: Please be advised that RX refills may take up to 3 business days. We ask that you follow-up with your pharmacy.

## 2018-08-26 NOTE — Telephone Encounter (Signed)
Pt's daughter is calling back to find out the status of her mom's medication.

## 2018-08-29 ENCOUNTER — Other Ambulatory Visit: Payer: Self-pay | Admitting: Internal Medicine
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
## 2018-08-29 NOTE — Telephone Encounter (Signed)
No last filled history on [facility] registry. Pls advise.Marland KitchenJohny Garcia

## 2018-08-29 NOTE — Telephone Encounter (Signed)
We faxed in the request for the refill of Ativan this morning.

## 2018-08-31 NOTE — Telephone Encounter (Signed)
Pts daughter called stating that facility received a faxed rx for topical ativan. Please resend the corrected form of ativan if this was not correct.

## 2018-09-01 NOTE — Telephone Encounter (Signed)
Patient's daughter is calling again regarding her mother's medication.  She stated that there is some confusion and she would like a call back today and said she was supposed to hear from the nurse or doctor yesterday.

## 2018-09-02 NOTE — Telephone Encounter (Signed)
Called Vicki Garcia (pt dtr) and she stated that [facility] need some clarification and possibly orders for ativan prn.   Called [facility] to get some specific information on what they are requesting.   LVM with RN desk to call my line directly or to fax request directly to side A.

## 2018-09-19 DIAGNOSIS — Z0001 Encounter for general adult medical examination with abnormal findings: Secondary | ICD-10-CM | POA: Diagnosis not present

## 2018-09-19 DIAGNOSIS — D509 Iron deficiency anemia, unspecified: Secondary | ICD-10-CM | POA: Diagnosis not present

## 2018-09-19 DIAGNOSIS — G301 Alzheimer's disease with late onset: Secondary | ICD-10-CM | POA: Diagnosis not present

## 2018-09-19 DIAGNOSIS — I10 Essential (primary) hypertension: Secondary | ICD-10-CM | POA: Diagnosis not present

## 2018-09-21 ENCOUNTER — Ambulatory Visit: Payer: Medicare Other | Admitting: Adult Health

## 2018-09-26 DIAGNOSIS — F419 Anxiety disorder, unspecified: Secondary | ICD-10-CM | POA: Diagnosis not present

## 2018-09-26 DIAGNOSIS — G301 Alzheimer's disease with late onset: Secondary | ICD-10-CM | POA: Diagnosis not present

## 2018-09-26 DIAGNOSIS — I10 Essential (primary) hypertension: Secondary | ICD-10-CM | POA: Diagnosis not present

## 2018-09-26 DIAGNOSIS — R400 Somnolence: Secondary | ICD-10-CM | POA: Diagnosis not present

## 2018-10-03 DIAGNOSIS — Z712 Person consulting for explanation of examination or test findings: Secondary | ICD-10-CM | POA: Diagnosis not present

## 2018-10-03 DIAGNOSIS — G301 Alzheimer's disease with late onset: Secondary | ICD-10-CM | POA: Diagnosis not present

## 2018-10-03 DIAGNOSIS — N183 Chronic kidney disease, stage 3 (moderate): Secondary | ICD-10-CM | POA: Diagnosis not present

## 2018-10-03 DIAGNOSIS — E782 Mixed hyperlipidemia: Secondary | ICD-10-CM | POA: Diagnosis not present

## 2018-10-13 DIAGNOSIS — Z23 Encounter for immunization: Secondary | ICD-10-CM | POA: Diagnosis not present

## 2018-10-17 DIAGNOSIS — G301 Alzheimer's disease with late onset: Secondary | ICD-10-CM | POA: Diagnosis not present

## 2018-10-17 DIAGNOSIS — I10 Essential (primary) hypertension: Secondary | ICD-10-CM | POA: Diagnosis not present

## 2018-10-17 DIAGNOSIS — F919 Conduct disorder, unspecified: Secondary | ICD-10-CM | POA: Diagnosis not present

## 2018-10-17 DIAGNOSIS — Z79899 Other long term (current) drug therapy: Secondary | ICD-10-CM | POA: Diagnosis not present

## 2018-11-07 DIAGNOSIS — G301 Alzheimer's disease with late onset: Secondary | ICD-10-CM | POA: Diagnosis not present

## 2018-11-07 DIAGNOSIS — F5101 Primary insomnia: Secondary | ICD-10-CM | POA: Diagnosis not present

## 2018-11-07 DIAGNOSIS — M542 Cervicalgia: Secondary | ICD-10-CM | POA: Diagnosis not present

## 2018-11-07 DIAGNOSIS — I10 Essential (primary) hypertension: Secondary | ICD-10-CM | POA: Diagnosis not present

## 2018-11-23 ENCOUNTER — Ambulatory Visit: Payer: Medicare Other | Admitting: Podiatry

## 2018-11-28 ENCOUNTER — Ambulatory Visit: Payer: Medicare Other

## 2018-11-28 ENCOUNTER — Ambulatory Visit: Payer: Medicare Other | Admitting: Podiatry

## 2018-11-28 ENCOUNTER — Other Ambulatory Visit: Payer: Self-pay

## 2018-11-28 DIAGNOSIS — K5901 Slow transit constipation: Secondary | ICD-10-CM | POA: Diagnosis not present

## 2018-11-28 DIAGNOSIS — B070 Plantar wart: Secondary | ICD-10-CM

## 2018-11-28 DIAGNOSIS — D509 Iron deficiency anemia, unspecified: Secondary | ICD-10-CM | POA: Diagnosis not present

## 2018-11-28 DIAGNOSIS — M79672 Pain in left foot: Secondary | ICD-10-CM

## 2018-11-28 DIAGNOSIS — G301 Alzheimer's disease with late onset: Secondary | ICD-10-CM | POA: Diagnosis not present

## 2018-11-28 DIAGNOSIS — M79671 Pain in right foot: Secondary | ICD-10-CM

## 2018-11-28 DIAGNOSIS — I10 Essential (primary) hypertension: Secondary | ICD-10-CM | POA: Diagnosis not present

## 2018-11-28 NOTE — Patient Instructions (Signed)
Corn and callus remover at the pharmacy

## 2018-12-01 NOTE — Progress Notes (Signed)
   Subjective: 83 y.o. female presenting today for follow up evaluation of a plantar lesion of the left foot that has been present for an unknown amount of time. Walking and applying pressure increases the pain. She has not done anything at home for treatment. Patient is here for further evaluation and treatment.    Past Medical History:  Diagnosis Date  . Disorder of left rotator cuff   . Diverticulosis of colon    AVM @ colonoscopy 07/2008  . GERD (gastroesophageal reflux disease)   . History of adenomatous polyp of colon    2010  . History of esophageal dilatation    07/ 2014  . History of esophagitis    07-/ 2014  . History of melanoma excision    1987  right upper arm  . Hyperlipidemia   . Hypertension   . Prolapsed internal hemorrhoids, grade 4   . Seasonal and perennial allergic rhinitis   . Tinnitus    chronic  . Wears glasses   . Wears hearing aid    bilateral    Objective: Physical Exam General: The patient is alert and oriented x3 in no acute distress.   Dermatology: Hyperkeratotic skin lesion(s) noted to the plantar aspect of the left foot approximately 1 cm in diameter. Pinpoint bleeding noted upon debridement. Skin is warm, dry and supple bilateral lower extremities. Negative for open lesions or macerations.   Vascular: Palpable pedal pulses bilaterally. No edema or erythema noted. Capillary refill within normal limits.   Neurological: Epicritic and protective threshold grossly intact bilaterally.    Musculoskeletal Exam: Pain on palpation to the noted skin lesion(s).  Range of motion within normal limits to all pedal and ankle joints bilateral. Muscle strength 5/5 in all groups bilateral.    Assessment: #1 plantar wart left foot   Plan of Care:  #1 Patient was evaluated. #2 Excisional debridement of the plantar wart lesion(s) was performed using a chisel blade. Salinocaine was applied and the lesion(s) was dressed with a dry sterile dressing. #3  recommended OTC corn and callus remover.  #4 patient is to return to clinic as needed.   Edrick Kins, DPM [facility]  Dr. Edrick Kins, [place]                                        [place], [place] [address]                Office [phone]  Fax [phone]

## 2018-12-13 DIAGNOSIS — Z961 Presence of intraocular lens: Secondary | ICD-10-CM | POA: Diagnosis not present

## 2019-01-04 DIAGNOSIS — Z20828 Contact with and (suspected) exposure to other viral communicable diseases: Secondary | ICD-10-CM | POA: Diagnosis not present

## 2019-01-04 DIAGNOSIS — Z1159 Encounter for screening for other viral diseases: Secondary | ICD-10-CM | POA: Diagnosis not present

## 2019-01-09 DIAGNOSIS — Z1159 Encounter for screening for other viral diseases: Secondary | ICD-10-CM | POA: Diagnosis not present

## 2019-01-09 DIAGNOSIS — Z20828 Contact with and (suspected) exposure to other viral communicable diseases: Secondary | ICD-10-CM | POA: Diagnosis not present

## 2019-01-16 DIAGNOSIS — Z20828 Contact with and (suspected) exposure to other viral communicable diseases: Secondary | ICD-10-CM | POA: Diagnosis not present

## 2019-01-16 DIAGNOSIS — Z1159 Encounter for screening for other viral diseases: Secondary | ICD-10-CM | POA: Diagnosis not present

## 2019-01-17 IMAGING — DX DG CHEST 2V
2 series · 2 of 2 positions shown · non-contrast
Comparison: 10/13/2016

CLINICAL DATA: Syncopal episode and diaphoresis

EXAM:
CHEST - 2 VIEW

[x chest ap]
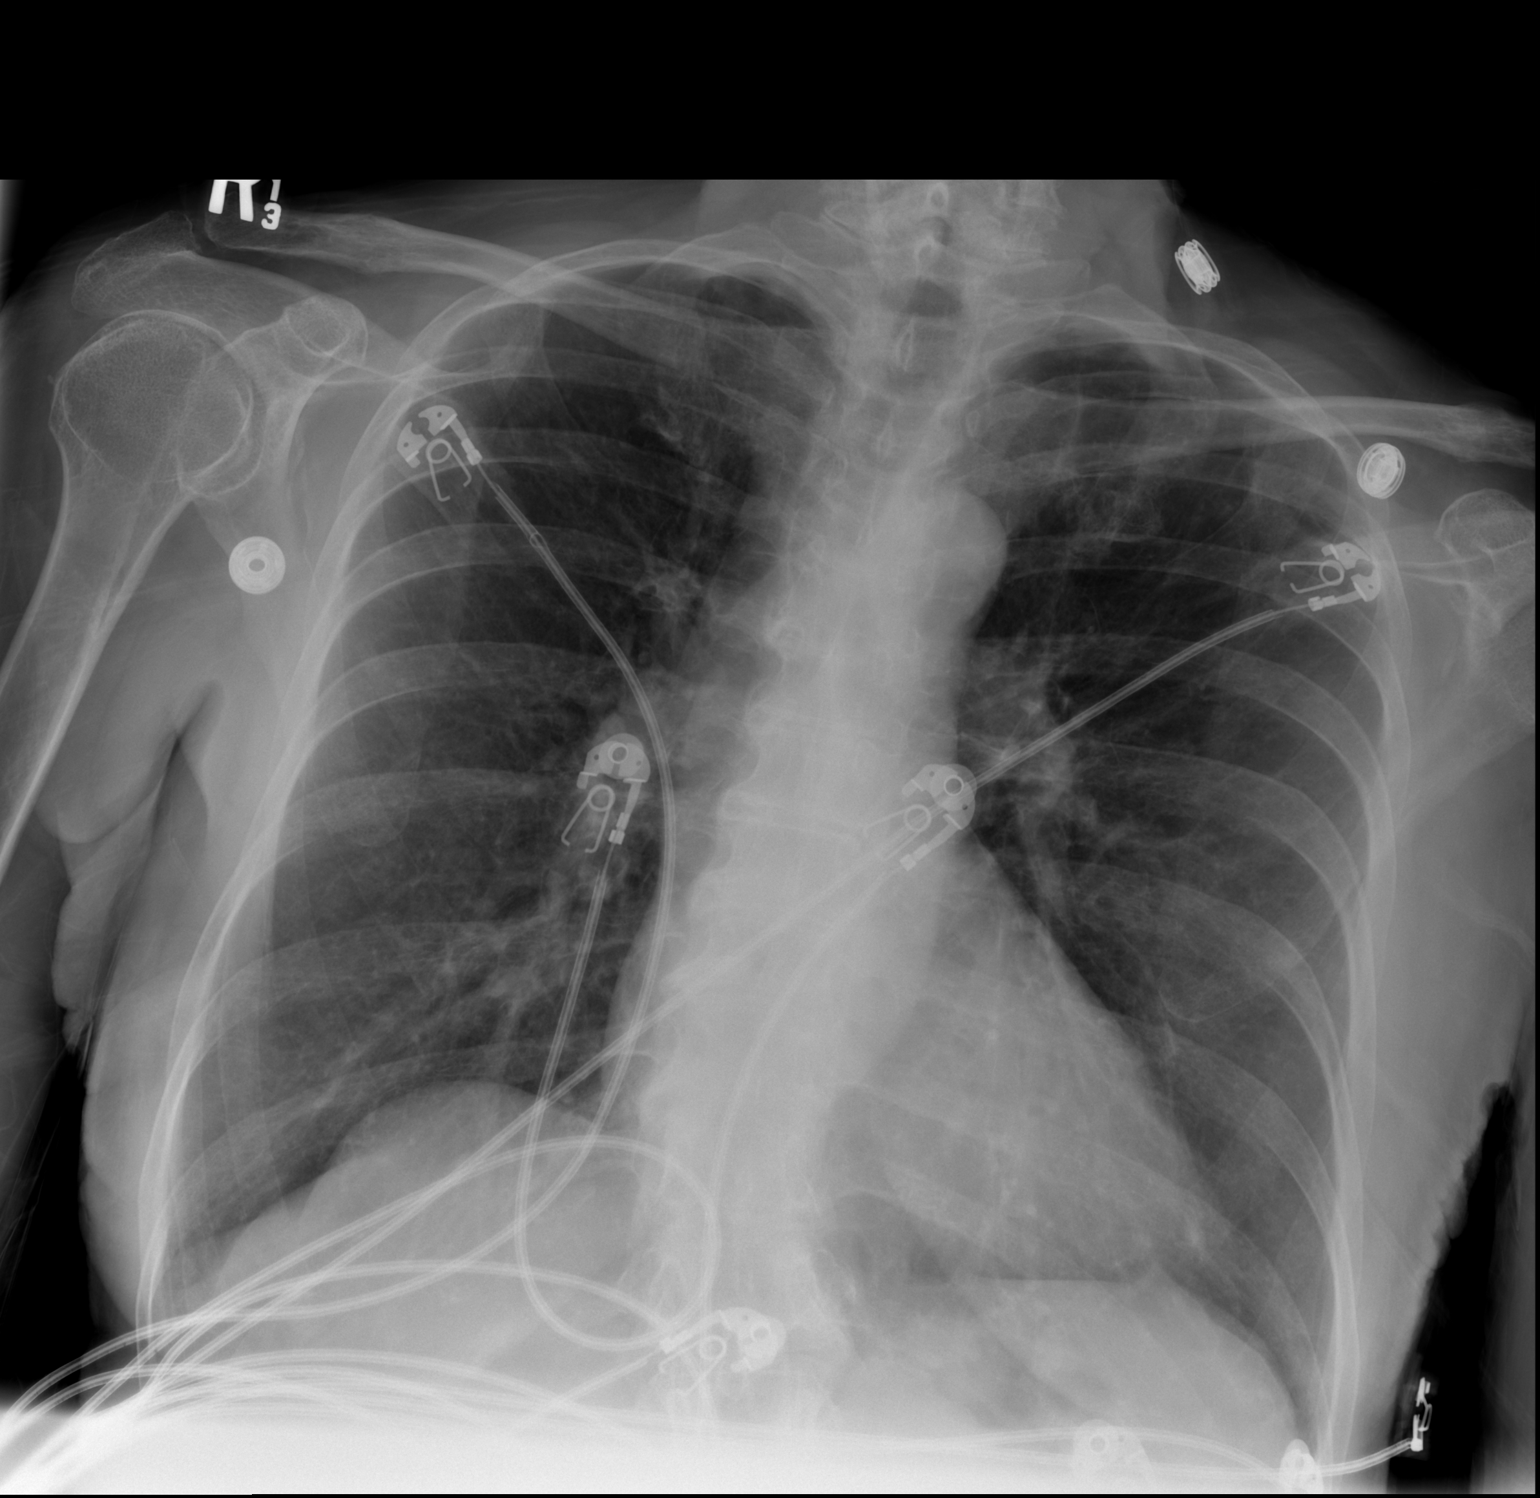

[w chest lat]
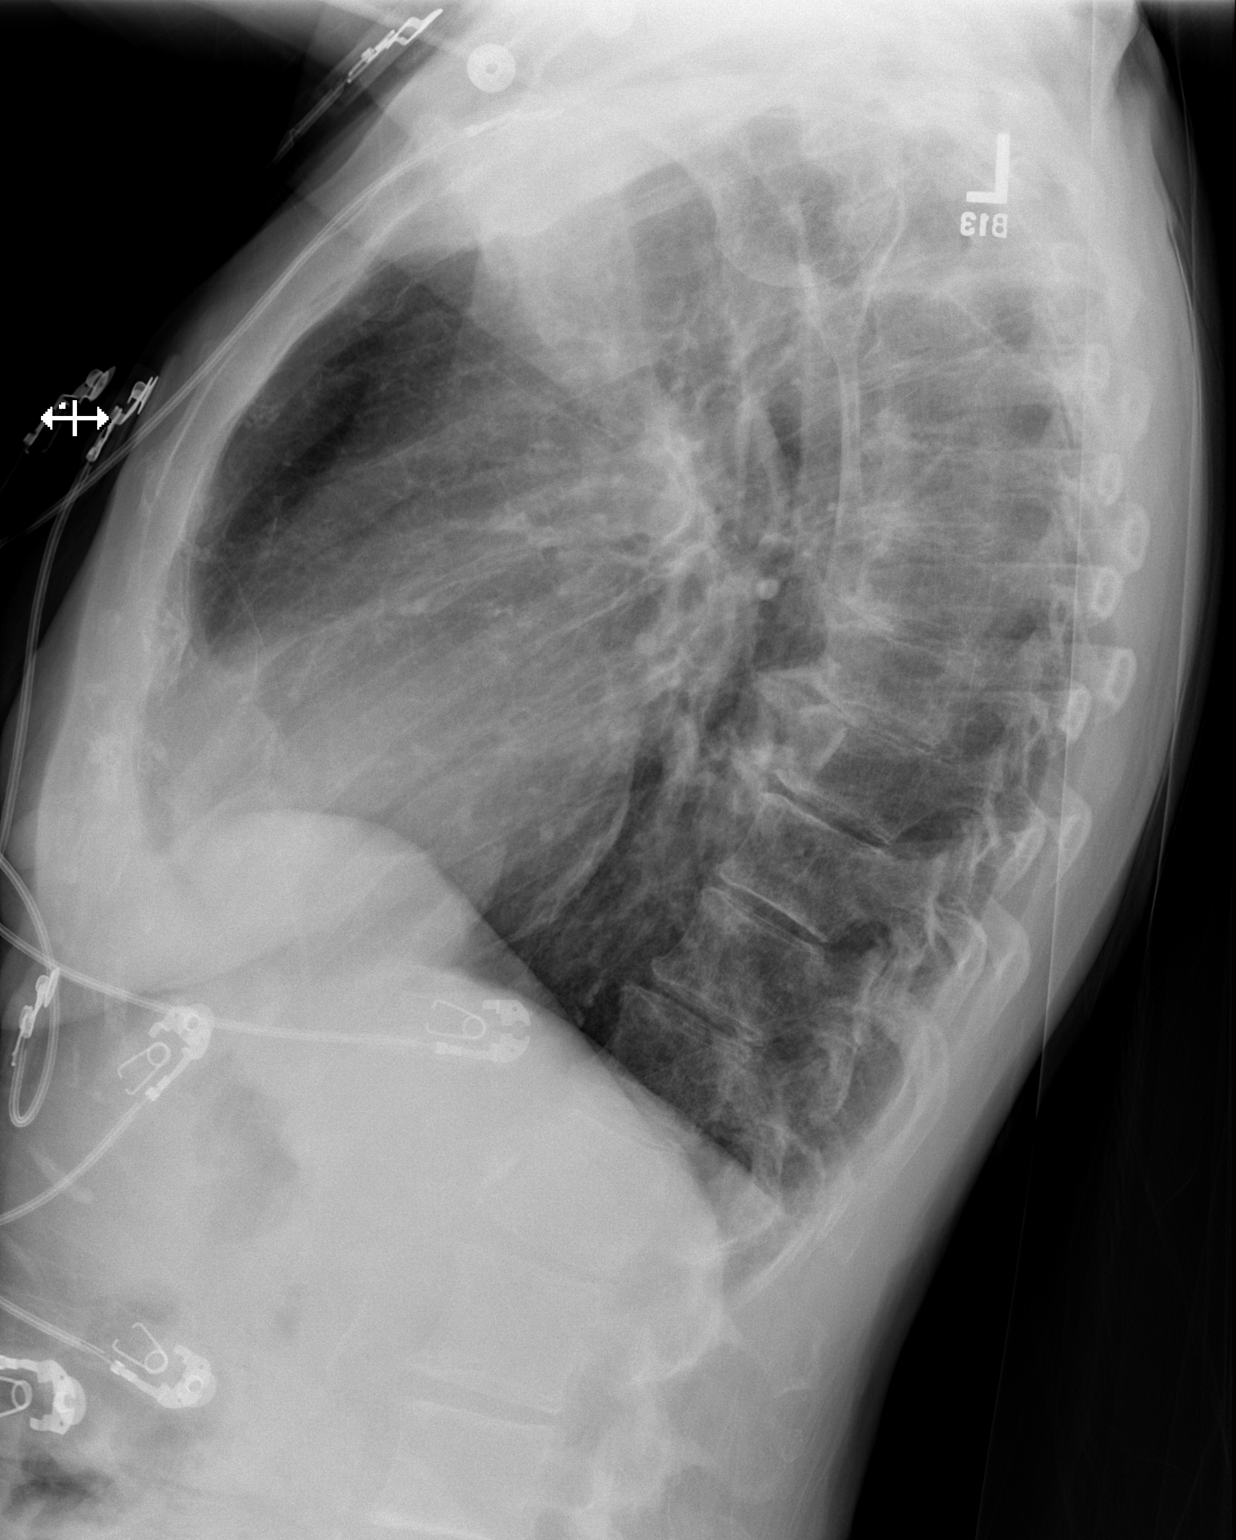

[2 of 2 positions shown; findings below may reference images not displayed]

FINDINGS: Cardiac shadow is within normal limits. The lungs are hyperinflated
consistent with COPD. Degenerative changes of the thoracic spine are
noted. No focal infiltrate or sizable effusion is noted.
IMPRESSION: COPD without acute abnormality.

## 2019-01-23 DIAGNOSIS — Z1159 Encounter for screening for other viral diseases: Secondary | ICD-10-CM | POA: Diagnosis not present

## 2019-01-23 DIAGNOSIS — Z20828 Contact with and (suspected) exposure to other viral communicable diseases: Secondary | ICD-10-CM | POA: Diagnosis not present

## 2019-01-23 DIAGNOSIS — Z0000 Encounter for general adult medical examination without abnormal findings: Secondary | ICD-10-CM | POA: Diagnosis not present

## 2019-01-23 DIAGNOSIS — G301 Alzheimer's disease with late onset: Secondary | ICD-10-CM | POA: Diagnosis not present

## 2019-01-23 DIAGNOSIS — E782 Mixed hyperlipidemia: Secondary | ICD-10-CM | POA: Diagnosis not present

## 2019-01-23 DIAGNOSIS — I10 Essential (primary) hypertension: Secondary | ICD-10-CM | POA: Diagnosis not present

## 2019-01-26 DIAGNOSIS — Z1159 Encounter for screening for other viral diseases: Secondary | ICD-10-CM | POA: Diagnosis not present

## 2019-01-26 DIAGNOSIS — Z20828 Contact with and (suspected) exposure to other viral communicable diseases: Secondary | ICD-10-CM | POA: Diagnosis not present

## 2019-01-30 DIAGNOSIS — Z1159 Encounter for screening for other viral diseases: Secondary | ICD-10-CM | POA: Diagnosis not present

## 2019-01-30 DIAGNOSIS — Z20828 Contact with and (suspected) exposure to other viral communicable diseases: Secondary | ICD-10-CM | POA: Diagnosis not present

## 2019-02-02 DIAGNOSIS — Z20828 Contact with and (suspected) exposure to other viral communicable diseases: Secondary | ICD-10-CM | POA: Diagnosis not present

## 2019-02-02 DIAGNOSIS — Z1159 Encounter for screening for other viral diseases: Secondary | ICD-10-CM | POA: Diagnosis not present

## 2019-02-06 DIAGNOSIS — Z1159 Encounter for screening for other viral diseases: Secondary | ICD-10-CM | POA: Diagnosis not present

## 2019-02-06 DIAGNOSIS — Z20828 Contact with and (suspected) exposure to other viral communicable diseases: Secondary | ICD-10-CM | POA: Diagnosis not present

## 2019-02-06 DIAGNOSIS — Z712 Person consulting for explanation of examination or test findings: Secondary | ICD-10-CM | POA: Diagnosis not present

## 2019-02-06 DIAGNOSIS — I10 Essential (primary) hypertension: Secondary | ICD-10-CM | POA: Diagnosis not present

## 2019-02-06 DIAGNOSIS — R0689 Other abnormalities of breathing: Secondary | ICD-10-CM | POA: Diagnosis not present

## 2019-02-06 DIAGNOSIS — E782 Mixed hyperlipidemia: Secondary | ICD-10-CM | POA: Diagnosis not present

## 2019-02-09 DIAGNOSIS — Z1159 Encounter for screening for other viral diseases: Secondary | ICD-10-CM | POA: Diagnosis not present

## 2019-02-09 DIAGNOSIS — Z20828 Contact with and (suspected) exposure to other viral communicable diseases: Secondary | ICD-10-CM | POA: Diagnosis not present

## 2019-02-13 DIAGNOSIS — Z1159 Encounter for screening for other viral diseases: Secondary | ICD-10-CM | POA: Diagnosis not present

## 2019-02-13 DIAGNOSIS — G301 Alzheimer's disease with late onset: Secondary | ICD-10-CM | POA: Diagnosis not present

## 2019-02-13 DIAGNOSIS — Z20828 Contact with and (suspected) exposure to other viral communicable diseases: Secondary | ICD-10-CM | POA: Diagnosis not present

## 2019-02-13 DIAGNOSIS — R410 Disorientation, unspecified: Secondary | ICD-10-CM | POA: Diagnosis not present

## 2019-02-13 DIAGNOSIS — K5901 Slow transit constipation: Secondary | ICD-10-CM | POA: Diagnosis not present

## 2019-02-13 DIAGNOSIS — I10 Essential (primary) hypertension: Secondary | ICD-10-CM | POA: Diagnosis not present

## 2019-02-16 DIAGNOSIS — Z1159 Encounter for screening for other viral diseases: Secondary | ICD-10-CM | POA: Diagnosis not present

## 2019-02-16 DIAGNOSIS — Z20828 Contact with and (suspected) exposure to other viral communicable diseases: Secondary | ICD-10-CM | POA: Diagnosis not present

## 2019-02-20 DIAGNOSIS — Z1159 Encounter for screening for other viral diseases: Secondary | ICD-10-CM | POA: Diagnosis not present

## 2019-02-20 DIAGNOSIS — Z20828 Contact with and (suspected) exposure to other viral communicable diseases: Secondary | ICD-10-CM | POA: Diagnosis not present

## 2019-02-23 DIAGNOSIS — Z1159 Encounter for screening for other viral diseases: Secondary | ICD-10-CM | POA: Diagnosis not present

## 2019-02-23 DIAGNOSIS — Z20828 Contact with and (suspected) exposure to other viral communicable diseases: Secondary | ICD-10-CM | POA: Diagnosis not present

## 2019-02-27 DIAGNOSIS — R109 Unspecified abdominal pain: Secondary | ICD-10-CM | POA: Diagnosis not present

## 2019-02-27 DIAGNOSIS — Z20828 Contact with and (suspected) exposure to other viral communicable diseases: Secondary | ICD-10-CM | POA: Diagnosis not present

## 2019-02-27 DIAGNOSIS — R112 Nausea with vomiting, unspecified: Secondary | ICD-10-CM | POA: Diagnosis not present

## 2019-02-27 DIAGNOSIS — K5901 Slow transit constipation: Secondary | ICD-10-CM | POA: Diagnosis not present

## 2019-02-27 DIAGNOSIS — I10 Essential (primary) hypertension: Secondary | ICD-10-CM | POA: Diagnosis not present

## 2019-02-27 DIAGNOSIS — R1110 Vomiting, unspecified: Secondary | ICD-10-CM | POA: Diagnosis not present

## 2019-02-27 DIAGNOSIS — Z1159 Encounter for screening for other viral diseases: Secondary | ICD-10-CM | POA: Diagnosis not present

## 2019-02-27 DIAGNOSIS — G301 Alzheimer's disease with late onset: Secondary | ICD-10-CM | POA: Diagnosis not present

## 2019-03-02 DIAGNOSIS — Z1159 Encounter for screening for other viral diseases: Secondary | ICD-10-CM | POA: Diagnosis not present

## 2019-03-02 DIAGNOSIS — Z20828 Contact with and (suspected) exposure to other viral communicable diseases: Secondary | ICD-10-CM | POA: Diagnosis not present

## 2019-03-06 DIAGNOSIS — G301 Alzheimer's disease with late onset: Secondary | ICD-10-CM | POA: Diagnosis not present

## 2019-03-06 DIAGNOSIS — Z20828 Contact with and (suspected) exposure to other viral communicable diseases: Secondary | ICD-10-CM | POA: Diagnosis not present

## 2019-03-06 DIAGNOSIS — Z1159 Encounter for screening for other viral diseases: Secondary | ICD-10-CM | POA: Diagnosis not present

## 2019-03-06 DIAGNOSIS — N390 Urinary tract infection, site not specified: Secondary | ICD-10-CM | POA: Diagnosis not present

## 2019-03-06 DIAGNOSIS — F5101 Primary insomnia: Secondary | ICD-10-CM | POA: Diagnosis not present

## 2019-03-06 DIAGNOSIS — I10 Essential (primary) hypertension: Secondary | ICD-10-CM | POA: Diagnosis not present

## 2019-03-09 DIAGNOSIS — Z1159 Encounter for screening for other viral diseases: Secondary | ICD-10-CM | POA: Diagnosis not present

## 2019-03-09 DIAGNOSIS — Z20828 Contact with and (suspected) exposure to other viral communicable diseases: Secondary | ICD-10-CM | POA: Diagnosis not present

## 2019-03-13 DIAGNOSIS — Z20828 Contact with and (suspected) exposure to other viral communicable diseases: Secondary | ICD-10-CM | POA: Diagnosis not present

## 2019-03-13 DIAGNOSIS — Z1159 Encounter for screening for other viral diseases: Secondary | ICD-10-CM | POA: Diagnosis not present

## 2019-03-16 DIAGNOSIS — Z20828 Contact with and (suspected) exposure to other viral communicable diseases: Secondary | ICD-10-CM | POA: Diagnosis not present

## 2019-03-16 DIAGNOSIS — Z1159 Encounter for screening for other viral diseases: Secondary | ICD-10-CM | POA: Diagnosis not present

## 2019-03-20 DIAGNOSIS — Z20828 Contact with and (suspected) exposure to other viral communicable diseases: Secondary | ICD-10-CM | POA: Diagnosis not present

## 2019-03-20 DIAGNOSIS — Z1159 Encounter for screening for other viral diseases: Secondary | ICD-10-CM | POA: Diagnosis not present

## 2019-03-23 DIAGNOSIS — Z1159 Encounter for screening for other viral diseases: Secondary | ICD-10-CM | POA: Diagnosis not present

## 2019-03-23 DIAGNOSIS — Z20828 Contact with and (suspected) exposure to other viral communicable diseases: Secondary | ICD-10-CM | POA: Diagnosis not present

## 2019-03-27 DIAGNOSIS — Z1159 Encounter for screening for other viral diseases: Secondary | ICD-10-CM | POA: Diagnosis not present

## 2019-03-27 DIAGNOSIS — Z20828 Contact with and (suspected) exposure to other viral communicable diseases: Secondary | ICD-10-CM | POA: Diagnosis not present

## 2019-03-30 DIAGNOSIS — Z20828 Contact with and (suspected) exposure to other viral communicable diseases: Secondary | ICD-10-CM | POA: Diagnosis not present

## 2019-03-30 DIAGNOSIS — Z1159 Encounter for screening for other viral diseases: Secondary | ICD-10-CM | POA: Diagnosis not present

## 2019-04-03 DIAGNOSIS — Z20828 Contact with and (suspected) exposure to other viral communicable diseases: Secondary | ICD-10-CM | POA: Diagnosis not present

## 2019-04-03 DIAGNOSIS — Z1159 Encounter for screening for other viral diseases: Secondary | ICD-10-CM | POA: Diagnosis not present

## 2019-04-06 DIAGNOSIS — Z20828 Contact with and (suspected) exposure to other viral communicable diseases: Secondary | ICD-10-CM | POA: Diagnosis not present

## 2019-04-06 DIAGNOSIS — Z1159 Encounter for screening for other viral diseases: Secondary | ICD-10-CM | POA: Diagnosis not present

## 2019-04-10 DIAGNOSIS — Z1159 Encounter for screening for other viral diseases: Secondary | ICD-10-CM | POA: Diagnosis not present

## 2019-04-10 DIAGNOSIS — Z20828 Contact with and (suspected) exposure to other viral communicable diseases: Secondary | ICD-10-CM | POA: Diagnosis not present

## 2019-04-13 DIAGNOSIS — Z1159 Encounter for screening for other viral diseases: Secondary | ICD-10-CM | POA: Diagnosis not present

## 2019-04-13 DIAGNOSIS — Z20828 Contact with and (suspected) exposure to other viral communicable diseases: Secondary | ICD-10-CM | POA: Diagnosis not present

## 2019-04-17 DIAGNOSIS — R4587 Impulsiveness: Secondary | ICD-10-CM | POA: Diagnosis not present

## 2019-04-17 DIAGNOSIS — Z79899 Other long term (current) drug therapy: Secondary | ICD-10-CM | POA: Diagnosis not present

## 2019-04-17 DIAGNOSIS — Z20828 Contact with and (suspected) exposure to other viral communicable diseases: Secondary | ICD-10-CM | POA: Diagnosis not present

## 2019-04-17 DIAGNOSIS — G301 Alzheimer's disease with late onset: Secondary | ICD-10-CM | POA: Diagnosis not present

## 2019-04-17 DIAGNOSIS — F0281 Dementia in other diseases classified elsewhere with behavioral disturbance: Secondary | ICD-10-CM | POA: Diagnosis not present

## 2019-04-17 DIAGNOSIS — Z1159 Encounter for screening for other viral diseases: Secondary | ICD-10-CM | POA: Diagnosis not present

## 2019-04-20 DIAGNOSIS — Z1159 Encounter for screening for other viral diseases: Secondary | ICD-10-CM | POA: Diagnosis not present

## 2019-04-20 DIAGNOSIS — Z20828 Contact with and (suspected) exposure to other viral communicable diseases: Secondary | ICD-10-CM | POA: Diagnosis not present

## 2019-04-24 DIAGNOSIS — Z20828 Contact with and (suspected) exposure to other viral communicable diseases: Secondary | ICD-10-CM | POA: Diagnosis not present

## 2019-04-24 DIAGNOSIS — Z1159 Encounter for screening for other viral diseases: Secondary | ICD-10-CM | POA: Diagnosis not present

## 2019-04-27 DIAGNOSIS — Z20828 Contact with and (suspected) exposure to other viral communicable diseases: Secondary | ICD-10-CM | POA: Diagnosis not present

## 2019-04-27 DIAGNOSIS — Z1159 Encounter for screening for other viral diseases: Secondary | ICD-10-CM | POA: Diagnosis not present

## 2019-05-01 DIAGNOSIS — Z20828 Contact with and (suspected) exposure to other viral communicable diseases: Secondary | ICD-10-CM | POA: Diagnosis not present

## 2019-05-01 DIAGNOSIS — Z1159 Encounter for screening for other viral diseases: Secondary | ICD-10-CM | POA: Diagnosis not present

## 2019-05-04 DIAGNOSIS — Z20828 Contact with and (suspected) exposure to other viral communicable diseases: Secondary | ICD-10-CM | POA: Diagnosis not present

## 2019-05-04 DIAGNOSIS — Z1159 Encounter for screening for other viral diseases: Secondary | ICD-10-CM | POA: Diagnosis not present

## 2019-05-08 DIAGNOSIS — D509 Iron deficiency anemia, unspecified: Secondary | ICD-10-CM | POA: Diagnosis not present

## 2019-05-08 DIAGNOSIS — Z1159 Encounter for screening for other viral diseases: Secondary | ICD-10-CM | POA: Diagnosis not present

## 2019-05-08 DIAGNOSIS — I10 Essential (primary) hypertension: Secondary | ICD-10-CM | POA: Diagnosis not present

## 2019-05-08 DIAGNOSIS — Z20828 Contact with and (suspected) exposure to other viral communicable diseases: Secondary | ICD-10-CM | POA: Diagnosis not present

## 2019-05-08 DIAGNOSIS — K5909 Other constipation: Secondary | ICD-10-CM | POA: Diagnosis not present

## 2019-05-08 DIAGNOSIS — F5109 Other insomnia not due to a substance or known physiological condition: Secondary | ICD-10-CM | POA: Diagnosis not present

## 2019-05-11 DIAGNOSIS — Z1159 Encounter for screening for other viral diseases: Secondary | ICD-10-CM | POA: Diagnosis not present

## 2019-05-11 DIAGNOSIS — Z20828 Contact with and (suspected) exposure to other viral communicable diseases: Secondary | ICD-10-CM | POA: Diagnosis not present

## 2019-05-15 DIAGNOSIS — Z1159 Encounter for screening for other viral diseases: Secondary | ICD-10-CM | POA: Diagnosis not present

## 2019-05-15 DIAGNOSIS — Z20828 Contact with and (suspected) exposure to other viral communicable diseases: Secondary | ICD-10-CM | POA: Diagnosis not present

## 2019-05-18 DIAGNOSIS — E782 Mixed hyperlipidemia: Secondary | ICD-10-CM | POA: Diagnosis not present

## 2019-05-18 DIAGNOSIS — I10 Essential (primary) hypertension: Secondary | ICD-10-CM | POA: Diagnosis not present

## 2019-05-18 DIAGNOSIS — Z20828 Contact with and (suspected) exposure to other viral communicable diseases: Secondary | ICD-10-CM | POA: Diagnosis not present

## 2019-05-18 DIAGNOSIS — Z1159 Encounter for screening for other viral diseases: Secondary | ICD-10-CM | POA: Diagnosis not present

## 2019-05-22 DIAGNOSIS — R3989 Other symptoms and signs involving the genitourinary system: Secondary | ICD-10-CM | POA: Diagnosis not present

## 2019-05-22 DIAGNOSIS — Z20828 Contact with and (suspected) exposure to other viral communicable diseases: Secondary | ICD-10-CM | POA: Diagnosis not present

## 2019-05-22 DIAGNOSIS — Z712 Person consulting for explanation of examination or test findings: Secondary | ICD-10-CM | POA: Diagnosis not present

## 2019-05-22 DIAGNOSIS — Z1159 Encounter for screening for other viral diseases: Secondary | ICD-10-CM | POA: Diagnosis not present

## 2019-05-22 DIAGNOSIS — F0281 Dementia in other diseases classified elsewhere with behavioral disturbance: Secondary | ICD-10-CM | POA: Diagnosis not present

## 2019-05-22 DIAGNOSIS — D7589 Other specified diseases of blood and blood-forming organs: Secondary | ICD-10-CM | POA: Diagnosis not present

## 2019-05-25 DIAGNOSIS — Z1159 Encounter for screening for other viral diseases: Secondary | ICD-10-CM | POA: Diagnosis not present

## 2019-05-25 DIAGNOSIS — Z20828 Contact with and (suspected) exposure to other viral communicable diseases: Secondary | ICD-10-CM | POA: Diagnosis not present

## 2019-05-29 DIAGNOSIS — Z1159 Encounter for screening for other viral diseases: Secondary | ICD-10-CM | POA: Diagnosis not present

## 2019-05-29 DIAGNOSIS — Z20828 Contact with and (suspected) exposure to other viral communicable diseases: Secondary | ICD-10-CM | POA: Diagnosis not present

## 2019-06-01 DIAGNOSIS — Z20828 Contact with and (suspected) exposure to other viral communicable diseases: Secondary | ICD-10-CM | POA: Diagnosis not present

## 2019-06-01 DIAGNOSIS — Z1159 Encounter for screening for other viral diseases: Secondary | ICD-10-CM | POA: Diagnosis not present

## 2019-06-02 DIAGNOSIS — Z79899 Other long term (current) drug therapy: Secondary | ICD-10-CM | POA: Diagnosis not present

## 2019-06-05 DIAGNOSIS — I10 Essential (primary) hypertension: Secondary | ICD-10-CM | POA: Diagnosis not present

## 2019-06-05 DIAGNOSIS — F0281 Dementia in other diseases classified elsewhere with behavioral disturbance: Secondary | ICD-10-CM | POA: Diagnosis not present

## 2019-06-05 DIAGNOSIS — Z20828 Contact with and (suspected) exposure to other viral communicable diseases: Secondary | ICD-10-CM | POA: Diagnosis not present

## 2019-06-05 DIAGNOSIS — G301 Alzheimer's disease with late onset: Secondary | ICD-10-CM | POA: Diagnosis not present

## 2019-06-05 DIAGNOSIS — R3989 Other symptoms and signs involving the genitourinary system: Secondary | ICD-10-CM | POA: Diagnosis not present

## 2019-06-05 DIAGNOSIS — Z1159 Encounter for screening for other viral diseases: Secondary | ICD-10-CM | POA: Diagnosis not present

## 2019-06-08 DIAGNOSIS — Z1159 Encounter for screening for other viral diseases: Secondary | ICD-10-CM | POA: Diagnosis not present

## 2019-06-08 DIAGNOSIS — Z20828 Contact with and (suspected) exposure to other viral communicable diseases: Secondary | ICD-10-CM | POA: Diagnosis not present

## 2019-06-12 DIAGNOSIS — Z1159 Encounter for screening for other viral diseases: Secondary | ICD-10-CM | POA: Diagnosis not present

## 2019-06-12 DIAGNOSIS — Z20828 Contact with and (suspected) exposure to other viral communicable diseases: Secondary | ICD-10-CM | POA: Diagnosis not present

## 2019-06-15 DIAGNOSIS — Z20828 Contact with and (suspected) exposure to other viral communicable diseases: Secondary | ICD-10-CM | POA: Diagnosis not present

## 2019-06-15 DIAGNOSIS — Z1159 Encounter for screening for other viral diseases: Secondary | ICD-10-CM | POA: Diagnosis not present

## 2019-06-19 DIAGNOSIS — Z20828 Contact with and (suspected) exposure to other viral communicable diseases: Secondary | ICD-10-CM | POA: Diagnosis not present

## 2019-06-19 DIAGNOSIS — Z1159 Encounter for screening for other viral diseases: Secondary | ICD-10-CM | POA: Diagnosis not present

## 2019-06-20 DIAGNOSIS — I739 Peripheral vascular disease, unspecified: Secondary | ICD-10-CM | POA: Diagnosis not present

## 2019-06-20 DIAGNOSIS — L603 Nail dystrophy: Secondary | ICD-10-CM | POA: Diagnosis not present

## 2019-06-20 DIAGNOSIS — Q845 Enlarged and hypertrophic nails: Secondary | ICD-10-CM | POA: Diagnosis not present

## 2019-06-20 DIAGNOSIS — B351 Tinea unguium: Secondary | ICD-10-CM | POA: Diagnosis not present

## 2019-06-22 DIAGNOSIS — Z20828 Contact with and (suspected) exposure to other viral communicable diseases: Secondary | ICD-10-CM | POA: Diagnosis not present

## 2019-06-22 DIAGNOSIS — Z1159 Encounter for screening for other viral diseases: Secondary | ICD-10-CM | POA: Diagnosis not present

## 2019-06-26 DIAGNOSIS — Z1159 Encounter for screening for other viral diseases: Secondary | ICD-10-CM | POA: Diagnosis not present

## 2019-06-26 DIAGNOSIS — Z20828 Contact with and (suspected) exposure to other viral communicable diseases: Secondary | ICD-10-CM | POA: Diagnosis not present

## 2019-06-29 DIAGNOSIS — Z1159 Encounter for screening for other viral diseases: Secondary | ICD-10-CM | POA: Diagnosis not present

## 2019-06-29 DIAGNOSIS — Z20828 Contact with and (suspected) exposure to other viral communicable diseases: Secondary | ICD-10-CM | POA: Diagnosis not present

## 2019-07-03 DIAGNOSIS — Z1159 Encounter for screening for other viral diseases: Secondary | ICD-10-CM | POA: Diagnosis not present

## 2019-07-03 DIAGNOSIS — Z20828 Contact with and (suspected) exposure to other viral communicable diseases: Secondary | ICD-10-CM | POA: Diagnosis not present

## 2019-07-06 DIAGNOSIS — Z20828 Contact with and (suspected) exposure to other viral communicable diseases: Secondary | ICD-10-CM | POA: Diagnosis not present

## 2019-07-06 DIAGNOSIS — Z1159 Encounter for screening for other viral diseases: Secondary | ICD-10-CM | POA: Diagnosis not present

## 2019-07-10 DIAGNOSIS — Z1159 Encounter for screening for other viral diseases: Secondary | ICD-10-CM | POA: Diagnosis not present

## 2019-07-10 DIAGNOSIS — Z20828 Contact with and (suspected) exposure to other viral communicable diseases: Secondary | ICD-10-CM | POA: Diagnosis not present

## 2019-07-13 DIAGNOSIS — Z20828 Contact with and (suspected) exposure to other viral communicable diseases: Secondary | ICD-10-CM | POA: Diagnosis not present

## 2019-07-13 DIAGNOSIS — Z1159 Encounter for screening for other viral diseases: Secondary | ICD-10-CM | POA: Diagnosis not present

## 2019-07-17 DIAGNOSIS — Z20828 Contact with and (suspected) exposure to other viral communicable diseases: Secondary | ICD-10-CM | POA: Diagnosis not present

## 2019-07-17 DIAGNOSIS — Z1159 Encounter for screening for other viral diseases: Secondary | ICD-10-CM | POA: Diagnosis not present

## 2019-07-20 DIAGNOSIS — Z20828 Contact with and (suspected) exposure to other viral communicable diseases: Secondary | ICD-10-CM | POA: Diagnosis not present

## 2019-07-20 DIAGNOSIS — Z1159 Encounter for screening for other viral diseases: Secondary | ICD-10-CM | POA: Diagnosis not present

## 2019-07-24 DIAGNOSIS — Z20828 Contact with and (suspected) exposure to other viral communicable diseases: Secondary | ICD-10-CM | POA: Diagnosis not present

## 2019-07-24 DIAGNOSIS — F0281 Dementia in other diseases classified elsewhere with behavioral disturbance: Secondary | ICD-10-CM | POA: Diagnosis not present

## 2019-07-24 DIAGNOSIS — I10 Essential (primary) hypertension: Secondary | ICD-10-CM | POA: Diagnosis not present

## 2019-07-24 DIAGNOSIS — Z79899 Other long term (current) drug therapy: Secondary | ICD-10-CM | POA: Diagnosis not present

## 2019-07-24 DIAGNOSIS — G301 Alzheimer's disease with late onset: Secondary | ICD-10-CM | POA: Diagnosis not present

## 2019-07-24 DIAGNOSIS — Z1159 Encounter for screening for other viral diseases: Secondary | ICD-10-CM | POA: Diagnosis not present

## 2019-07-27 DIAGNOSIS — Z1159 Encounter for screening for other viral diseases: Secondary | ICD-10-CM | POA: Diagnosis not present

## 2019-07-27 DIAGNOSIS — Z20828 Contact with and (suspected) exposure to other viral communicable diseases: Secondary | ICD-10-CM | POA: Diagnosis not present

## 2019-07-31 DIAGNOSIS — Z20828 Contact with and (suspected) exposure to other viral communicable diseases: Secondary | ICD-10-CM | POA: Diagnosis not present

## 2019-07-31 DIAGNOSIS — Z1159 Encounter for screening for other viral diseases: Secondary | ICD-10-CM | POA: Diagnosis not present

## 2019-08-03 DIAGNOSIS — Z1159 Encounter for screening for other viral diseases: Secondary | ICD-10-CM | POA: Diagnosis not present

## 2019-08-03 DIAGNOSIS — Z20828 Contact with and (suspected) exposure to other viral communicable diseases: Secondary | ICD-10-CM | POA: Diagnosis not present

## 2019-08-07 DIAGNOSIS — Z1159 Encounter for screening for other viral diseases: Secondary | ICD-10-CM | POA: Diagnosis not present

## 2019-08-07 DIAGNOSIS — N390 Urinary tract infection, site not specified: Secondary | ICD-10-CM | POA: Diagnosis not present

## 2019-08-07 DIAGNOSIS — G301 Alzheimer's disease with late onset: Secondary | ICD-10-CM | POA: Diagnosis not present

## 2019-08-07 DIAGNOSIS — Z20828 Contact with and (suspected) exposure to other viral communicable diseases: Secondary | ICD-10-CM | POA: Diagnosis not present

## 2019-08-07 DIAGNOSIS — F0281 Dementia in other diseases classified elsewhere with behavioral disturbance: Secondary | ICD-10-CM | POA: Diagnosis not present

## 2019-08-07 DIAGNOSIS — F331 Major depressive disorder, recurrent, moderate: Secondary | ICD-10-CM | POA: Diagnosis not present

## 2019-08-07 DIAGNOSIS — I10 Essential (primary) hypertension: Secondary | ICD-10-CM | POA: Diagnosis not present

## 2019-08-07 DIAGNOSIS — Z79899 Other long term (current) drug therapy: Secondary | ICD-10-CM | POA: Diagnosis not present

## 2019-08-10 DIAGNOSIS — Z1159 Encounter for screening for other viral diseases: Secondary | ICD-10-CM | POA: Diagnosis not present

## 2019-08-10 DIAGNOSIS — Z20828 Contact with and (suspected) exposure to other viral communicable diseases: Secondary | ICD-10-CM | POA: Diagnosis not present

## 2019-08-14 DIAGNOSIS — Z20828 Contact with and (suspected) exposure to other viral communicable diseases: Secondary | ICD-10-CM | POA: Diagnosis not present

## 2019-08-14 DIAGNOSIS — G301 Alzheimer's disease with late onset: Secondary | ICD-10-CM | POA: Diagnosis not present

## 2019-08-14 DIAGNOSIS — F0391 Unspecified dementia with behavioral disturbance: Secondary | ICD-10-CM | POA: Diagnosis not present

## 2019-08-14 DIAGNOSIS — F331 Major depressive disorder, recurrent, moderate: Secondary | ICD-10-CM | POA: Diagnosis not present

## 2019-08-14 DIAGNOSIS — Z1159 Encounter for screening for other viral diseases: Secondary | ICD-10-CM | POA: Diagnosis not present

## 2019-08-14 DIAGNOSIS — I10 Essential (primary) hypertension: Secondary | ICD-10-CM | POA: Diagnosis not present

## 2019-08-17 DIAGNOSIS — Z1159 Encounter for screening for other viral diseases: Secondary | ICD-10-CM | POA: Diagnosis not present

## 2019-08-17 DIAGNOSIS — Z20828 Contact with and (suspected) exposure to other viral communicable diseases: Secondary | ICD-10-CM | POA: Diagnosis not present

## 2019-08-21 DIAGNOSIS — Z1159 Encounter for screening for other viral diseases: Secondary | ICD-10-CM | POA: Diagnosis not present

## 2019-08-21 DIAGNOSIS — Z20828 Contact with and (suspected) exposure to other viral communicable diseases: Secondary | ICD-10-CM | POA: Diagnosis not present

## 2019-08-24 DIAGNOSIS — Z20828 Contact with and (suspected) exposure to other viral communicable diseases: Secondary | ICD-10-CM | POA: Diagnosis not present

## 2019-08-24 DIAGNOSIS — Z1159 Encounter for screening for other viral diseases: Secondary | ICD-10-CM | POA: Diagnosis not present

## 2019-08-28 DIAGNOSIS — Z1159 Encounter for screening for other viral diseases: Secondary | ICD-10-CM | POA: Diagnosis not present

## 2019-08-28 DIAGNOSIS — Z20828 Contact with and (suspected) exposure to other viral communicable diseases: Secondary | ICD-10-CM | POA: Diagnosis not present

## 2019-08-31 DIAGNOSIS — Z20828 Contact with and (suspected) exposure to other viral communicable diseases: Secondary | ICD-10-CM | POA: Diagnosis not present

## 2019-08-31 DIAGNOSIS — Z1159 Encounter for screening for other viral diseases: Secondary | ICD-10-CM | POA: Diagnosis not present

## 2019-09-04 DIAGNOSIS — Z1159 Encounter for screening for other viral diseases: Secondary | ICD-10-CM | POA: Diagnosis not present

## 2019-09-04 DIAGNOSIS — Z20828 Contact with and (suspected) exposure to other viral communicable diseases: Secondary | ICD-10-CM | POA: Diagnosis not present

## 2019-09-07 DIAGNOSIS — Z1159 Encounter for screening for other viral diseases: Secondary | ICD-10-CM | POA: Diagnosis not present

## 2019-09-07 DIAGNOSIS — Z20828 Contact with and (suspected) exposure to other viral communicable diseases: Secondary | ICD-10-CM | POA: Diagnosis not present

## 2019-09-11 DIAGNOSIS — Z20828 Contact with and (suspected) exposure to other viral communicable diseases: Secondary | ICD-10-CM | POA: Diagnosis not present

## 2019-09-11 DIAGNOSIS — G301 Alzheimer's disease with late onset: Secondary | ICD-10-CM | POA: Diagnosis not present

## 2019-09-11 DIAGNOSIS — I10 Essential (primary) hypertension: Secondary | ICD-10-CM | POA: Diagnosis not present

## 2019-09-11 DIAGNOSIS — W19XXXA Unspecified fall, initial encounter: Secondary | ICD-10-CM | POA: Diagnosis not present

## 2019-09-11 DIAGNOSIS — F0281 Dementia in other diseases classified elsewhere with behavioral disturbance: Secondary | ICD-10-CM | POA: Diagnosis not present

## 2019-09-11 DIAGNOSIS — Z1159 Encounter for screening for other viral diseases: Secondary | ICD-10-CM | POA: Diagnosis not present

## 2019-09-13 DIAGNOSIS — N390 Urinary tract infection, site not specified: Secondary | ICD-10-CM | POA: Diagnosis not present

## 2019-09-13 DIAGNOSIS — Z79899 Other long term (current) drug therapy: Secondary | ICD-10-CM | POA: Diagnosis not present

## 2019-09-14 DIAGNOSIS — Z1159 Encounter for screening for other viral diseases: Secondary | ICD-10-CM | POA: Diagnosis not present

## 2019-09-14 DIAGNOSIS — Z20828 Contact with and (suspected) exposure to other viral communicable diseases: Secondary | ICD-10-CM | POA: Diagnosis not present

## 2019-09-18 DIAGNOSIS — Z20828 Contact with and (suspected) exposure to other viral communicable diseases: Secondary | ICD-10-CM | POA: Diagnosis not present

## 2019-09-18 DIAGNOSIS — N390 Urinary tract infection, site not specified: Secondary | ICD-10-CM | POA: Diagnosis not present

## 2019-09-18 DIAGNOSIS — Z1159 Encounter for screening for other viral diseases: Secondary | ICD-10-CM | POA: Diagnosis not present

## 2019-09-18 DIAGNOSIS — Z79899 Other long term (current) drug therapy: Secondary | ICD-10-CM | POA: Diagnosis not present

## 2019-09-21 DIAGNOSIS — Z1159 Encounter for screening for other viral diseases: Secondary | ICD-10-CM | POA: Diagnosis not present

## 2019-09-21 DIAGNOSIS — Z20828 Contact with and (suspected) exposure to other viral communicable diseases: Secondary | ICD-10-CM | POA: Diagnosis not present

## 2019-09-25 DIAGNOSIS — G301 Alzheimer's disease with late onset: Secondary | ICD-10-CM | POA: Diagnosis not present

## 2019-09-25 DIAGNOSIS — Z1159 Encounter for screening for other viral diseases: Secondary | ICD-10-CM | POA: Diagnosis not present

## 2019-09-25 DIAGNOSIS — N390 Urinary tract infection, site not specified: Secondary | ICD-10-CM | POA: Diagnosis not present

## 2019-09-25 DIAGNOSIS — I10 Essential (primary) hypertension: Secondary | ICD-10-CM | POA: Diagnosis not present

## 2019-09-25 DIAGNOSIS — Z20828 Contact with and (suspected) exposure to other viral communicable diseases: Secondary | ICD-10-CM | POA: Diagnosis not present

## 2019-09-25 DIAGNOSIS — F0281 Dementia in other diseases classified elsewhere with behavioral disturbance: Secondary | ICD-10-CM | POA: Diagnosis not present

## 2019-09-28 DIAGNOSIS — Z1159 Encounter for screening for other viral diseases: Secondary | ICD-10-CM | POA: Diagnosis not present

## 2019-09-28 DIAGNOSIS — Z20828 Contact with and (suspected) exposure to other viral communicable diseases: Secondary | ICD-10-CM | POA: Diagnosis not present

## 2019-10-02 DIAGNOSIS — Z20828 Contact with and (suspected) exposure to other viral communicable diseases: Secondary | ICD-10-CM | POA: Diagnosis not present

## 2019-10-02 DIAGNOSIS — I10 Essential (primary) hypertension: Secondary | ICD-10-CM | POA: Diagnosis not present

## 2019-10-02 DIAGNOSIS — F0281 Dementia in other diseases classified elsewhere with behavioral disturbance: Secondary | ICD-10-CM | POA: Diagnosis not present

## 2019-10-02 DIAGNOSIS — F5109 Other insomnia not due to a substance or known physiological condition: Secondary | ICD-10-CM | POA: Diagnosis not present

## 2019-10-02 DIAGNOSIS — Z1159 Encounter for screening for other viral diseases: Secondary | ICD-10-CM | POA: Diagnosis not present

## 2019-10-02 DIAGNOSIS — G301 Alzheimer's disease with late onset: Secondary | ICD-10-CM | POA: Diagnosis not present

## 2019-10-04 DIAGNOSIS — F0281 Dementia in other diseases classified elsewhere with behavioral disturbance: Secondary | ICD-10-CM | POA: Diagnosis not present

## 2019-10-05 DIAGNOSIS — Z1159 Encounter for screening for other viral diseases: Secondary | ICD-10-CM | POA: Diagnosis not present

## 2019-10-05 DIAGNOSIS — Z20828 Contact with and (suspected) exposure to other viral communicable diseases: Secondary | ICD-10-CM | POA: Diagnosis not present

## 2019-10-09 DIAGNOSIS — Z20828 Contact with and (suspected) exposure to other viral communicable diseases: Secondary | ICD-10-CM | POA: Diagnosis not present

## 2019-10-09 DIAGNOSIS — Z1159 Encounter for screening for other viral diseases: Secondary | ICD-10-CM | POA: Diagnosis not present

## 2019-10-16 DIAGNOSIS — Z20828 Contact with and (suspected) exposure to other viral communicable diseases: Secondary | ICD-10-CM | POA: Diagnosis not present

## 2019-10-16 DIAGNOSIS — N390 Urinary tract infection, site not specified: Secondary | ICD-10-CM | POA: Diagnosis not present

## 2019-10-16 DIAGNOSIS — I10 Essential (primary) hypertension: Secondary | ICD-10-CM | POA: Diagnosis not present

## 2019-10-16 DIAGNOSIS — G301 Alzheimer's disease with late onset: Secondary | ICD-10-CM | POA: Diagnosis not present

## 2019-10-16 DIAGNOSIS — Z79899 Other long term (current) drug therapy: Secondary | ICD-10-CM | POA: Diagnosis not present

## 2019-10-16 DIAGNOSIS — F0281 Dementia in other diseases classified elsewhere with behavioral disturbance: Secondary | ICD-10-CM | POA: Diagnosis not present

## 2019-10-16 DIAGNOSIS — Z7189 Other specified counseling: Secondary | ICD-10-CM | POA: Diagnosis not present

## 2019-10-16 DIAGNOSIS — Z1159 Encounter for screening for other viral diseases: Secondary | ICD-10-CM | POA: Diagnosis not present

## 2019-10-19 DIAGNOSIS — Z20828 Contact with and (suspected) exposure to other viral communicable diseases: Secondary | ICD-10-CM | POA: Diagnosis not present

## 2019-10-19 DIAGNOSIS — Z1159 Encounter for screening for other viral diseases: Secondary | ICD-10-CM | POA: Diagnosis not present

## 2019-10-23 DIAGNOSIS — F0281 Dementia in other diseases classified elsewhere with behavioral disturbance: Secondary | ICD-10-CM | POA: Diagnosis not present

## 2019-10-23 DIAGNOSIS — N390 Urinary tract infection, site not specified: Secondary | ICD-10-CM | POA: Diagnosis not present

## 2019-10-23 DIAGNOSIS — G301 Alzheimer's disease with late onset: Secondary | ICD-10-CM | POA: Diagnosis not present

## 2019-10-23 DIAGNOSIS — I10 Essential (primary) hypertension: Secondary | ICD-10-CM | POA: Diagnosis not present

## 2019-10-23 DIAGNOSIS — Z1159 Encounter for screening for other viral diseases: Secondary | ICD-10-CM | POA: Diagnosis not present

## 2019-10-23 DIAGNOSIS — Z20828 Contact with and (suspected) exposure to other viral communicable diseases: Secondary | ICD-10-CM | POA: Diagnosis not present

## 2019-10-26 DIAGNOSIS — Z1159 Encounter for screening for other viral diseases: Secondary | ICD-10-CM | POA: Diagnosis not present

## 2019-10-26 DIAGNOSIS — Z20828 Contact with and (suspected) exposure to other viral communicable diseases: Secondary | ICD-10-CM | POA: Diagnosis not present

## 2019-10-30 DIAGNOSIS — Z20828 Contact with and (suspected) exposure to other viral communicable diseases: Secondary | ICD-10-CM | POA: Diagnosis not present

## 2019-10-30 DIAGNOSIS — Z1159 Encounter for screening for other viral diseases: Secondary | ICD-10-CM | POA: Diagnosis not present

## 2019-11-02 DIAGNOSIS — Z1159 Encounter for screening for other viral diseases: Secondary | ICD-10-CM | POA: Diagnosis not present

## 2019-11-02 DIAGNOSIS — Z20828 Contact with and (suspected) exposure to other viral communicable diseases: Secondary | ICD-10-CM | POA: Diagnosis not present

## 2019-11-06 DIAGNOSIS — Z20828 Contact with and (suspected) exposure to other viral communicable diseases: Secondary | ICD-10-CM | POA: Diagnosis not present

## 2019-11-06 DIAGNOSIS — K036 Deposits [accretions] on teeth: Secondary | ICD-10-CM | POA: Diagnosis not present

## 2019-11-06 DIAGNOSIS — F0281 Dementia in other diseases classified elsewhere with behavioral disturbance: Secondary | ICD-10-CM | POA: Diagnosis not present

## 2019-11-06 DIAGNOSIS — Z79899 Other long term (current) drug therapy: Secondary | ICD-10-CM | POA: Diagnosis not present

## 2019-11-06 DIAGNOSIS — Z1159 Encounter for screening for other viral diseases: Secondary | ICD-10-CM | POA: Diagnosis not present

## 2019-11-06 DIAGNOSIS — G301 Alzheimer's disease with late onset: Secondary | ICD-10-CM | POA: Diagnosis not present

## 2019-11-09 DIAGNOSIS — Z20828 Contact with and (suspected) exposure to other viral communicable diseases: Secondary | ICD-10-CM | POA: Diagnosis not present

## 2019-11-09 DIAGNOSIS — Z1159 Encounter for screening for other viral diseases: Secondary | ICD-10-CM | POA: Diagnosis not present

## 2019-11-13 DIAGNOSIS — Z1159 Encounter for screening for other viral diseases: Secondary | ICD-10-CM | POA: Diagnosis not present

## 2019-11-13 DIAGNOSIS — Z20828 Contact with and (suspected) exposure to other viral communicable diseases: Secondary | ICD-10-CM | POA: Diagnosis not present

## 2019-11-16 DIAGNOSIS — Z20828 Contact with and (suspected) exposure to other viral communicable diseases: Secondary | ICD-10-CM | POA: Diagnosis not present

## 2019-11-16 DIAGNOSIS — Z1159 Encounter for screening for other viral diseases: Secondary | ICD-10-CM | POA: Diagnosis not present

## 2019-11-20 DIAGNOSIS — Z20828 Contact with and (suspected) exposure to other viral communicable diseases: Secondary | ICD-10-CM | POA: Diagnosis not present

## 2019-11-20 DIAGNOSIS — Z1159 Encounter for screening for other viral diseases: Secondary | ICD-10-CM | POA: Diagnosis not present

## 2019-11-23 DIAGNOSIS — Z1159 Encounter for screening for other viral diseases: Secondary | ICD-10-CM | POA: Diagnosis not present

## 2019-11-23 DIAGNOSIS — Z20828 Contact with and (suspected) exposure to other viral communicable diseases: Secondary | ICD-10-CM | POA: Diagnosis not present

## 2019-11-27 DIAGNOSIS — Z1159 Encounter for screening for other viral diseases: Secondary | ICD-10-CM | POA: Diagnosis not present

## 2019-11-27 DIAGNOSIS — Z20828 Contact with and (suspected) exposure to other viral communicable diseases: Secondary | ICD-10-CM | POA: Diagnosis not present

## 2019-11-30 DIAGNOSIS — Z1159 Encounter for screening for other viral diseases: Secondary | ICD-10-CM | POA: Diagnosis not present

## 2019-11-30 DIAGNOSIS — Z20828 Contact with and (suspected) exposure to other viral communicable diseases: Secondary | ICD-10-CM | POA: Diagnosis not present

## 2019-12-04 DIAGNOSIS — F0281 Dementia in other diseases classified elsewhere with behavioral disturbance: Secondary | ICD-10-CM | POA: Diagnosis not present

## 2019-12-04 DIAGNOSIS — W19XXXD Unspecified fall, subsequent encounter: Secondary | ICD-10-CM | POA: Diagnosis not present

## 2019-12-04 DIAGNOSIS — I10 Essential (primary) hypertension: Secondary | ICD-10-CM | POA: Diagnosis not present

## 2019-12-04 DIAGNOSIS — G301 Alzheimer's disease with late onset: Secondary | ICD-10-CM | POA: Diagnosis not present

## 2019-12-07 DIAGNOSIS — Z20828 Contact with and (suspected) exposure to other viral communicable diseases: Secondary | ICD-10-CM | POA: Diagnosis not present

## 2019-12-07 DIAGNOSIS — Z1159 Encounter for screening for other viral diseases: Secondary | ICD-10-CM | POA: Diagnosis not present

## 2019-12-11 DIAGNOSIS — Z1159 Encounter for screening for other viral diseases: Secondary | ICD-10-CM | POA: Diagnosis not present

## 2019-12-11 DIAGNOSIS — Z20828 Contact with and (suspected) exposure to other viral communicable diseases: Secondary | ICD-10-CM | POA: Diagnosis not present

## 2019-12-18 DIAGNOSIS — Z1159 Encounter for screening for other viral diseases: Secondary | ICD-10-CM | POA: Diagnosis not present

## 2019-12-18 DIAGNOSIS — Z20828 Contact with and (suspected) exposure to other viral communicable diseases: Secondary | ICD-10-CM | POA: Diagnosis not present

## 2019-12-21 DIAGNOSIS — Z20828 Contact with and (suspected) exposure to other viral communicable diseases: Secondary | ICD-10-CM | POA: Diagnosis not present

## 2019-12-21 DIAGNOSIS — Z1159 Encounter for screening for other viral diseases: Secondary | ICD-10-CM | POA: Diagnosis not present

## 2019-12-25 DIAGNOSIS — Z20828 Contact with and (suspected) exposure to other viral communicable diseases: Secondary | ICD-10-CM | POA: Diagnosis not present

## 2019-12-25 DIAGNOSIS — Z1159 Encounter for screening for other viral diseases: Secondary | ICD-10-CM | POA: Diagnosis not present

## 2019-12-28 DIAGNOSIS — Z1159 Encounter for screening for other viral diseases: Secondary | ICD-10-CM | POA: Diagnosis not present

## 2019-12-28 DIAGNOSIS — Z20828 Contact with and (suspected) exposure to other viral communicable diseases: Secondary | ICD-10-CM | POA: Diagnosis not present

## 2020-01-01 DIAGNOSIS — Z1159 Encounter for screening for other viral diseases: Secondary | ICD-10-CM | POA: Diagnosis not present

## 2020-01-01 DIAGNOSIS — Z20828 Contact with and (suspected) exposure to other viral communicable diseases: Secondary | ICD-10-CM | POA: Diagnosis not present

## 2020-01-04 DIAGNOSIS — Z1159 Encounter for screening for other viral diseases: Secondary | ICD-10-CM | POA: Diagnosis not present

## 2020-01-04 DIAGNOSIS — Z20828 Contact with and (suspected) exposure to other viral communicable diseases: Secondary | ICD-10-CM | POA: Diagnosis not present

## 2020-01-08 DIAGNOSIS — Z20828 Contact with and (suspected) exposure to other viral communicable diseases: Secondary | ICD-10-CM | POA: Diagnosis not present

## 2020-01-08 DIAGNOSIS — Z1159 Encounter for screening for other viral diseases: Secondary | ICD-10-CM | POA: Diagnosis not present

## 2020-01-11 DIAGNOSIS — Z20828 Contact with and (suspected) exposure to other viral communicable diseases: Secondary | ICD-10-CM | POA: Diagnosis not present

## 2020-01-11 DIAGNOSIS — Z1159 Encounter for screening for other viral diseases: Secondary | ICD-10-CM | POA: Diagnosis not present

## 2020-01-15 DIAGNOSIS — Z1159 Encounter for screening for other viral diseases: Secondary | ICD-10-CM | POA: Diagnosis not present

## 2020-01-15 DIAGNOSIS — Z20828 Contact with and (suspected) exposure to other viral communicable diseases: Secondary | ICD-10-CM | POA: Diagnosis not present

## 2020-01-18 DIAGNOSIS — Z20828 Contact with and (suspected) exposure to other viral communicable diseases: Secondary | ICD-10-CM | POA: Diagnosis not present

## 2020-01-18 DIAGNOSIS — Z1159 Encounter for screening for other viral diseases: Secondary | ICD-10-CM | POA: Diagnosis not present

## 2020-01-22 DIAGNOSIS — Z20828 Contact with and (suspected) exposure to other viral communicable diseases: Secondary | ICD-10-CM | POA: Diagnosis not present

## 2020-01-22 DIAGNOSIS — Z1159 Encounter for screening for other viral diseases: Secondary | ICD-10-CM | POA: Diagnosis not present

## 2020-01-25 DIAGNOSIS — Z1159 Encounter for screening for other viral diseases: Secondary | ICD-10-CM | POA: Diagnosis not present

## 2020-01-25 DIAGNOSIS — Z20828 Contact with and (suspected) exposure to other viral communicable diseases: Secondary | ICD-10-CM | POA: Diagnosis not present

## 2020-01-28 DIAGNOSIS — Z1159 Encounter for screening for other viral diseases: Secondary | ICD-10-CM | POA: Diagnosis not present

## 2020-01-28 DIAGNOSIS — Z20828 Contact with and (suspected) exposure to other viral communicable diseases: Secondary | ICD-10-CM | POA: Diagnosis not present

## 2020-01-29 DIAGNOSIS — Z20828 Contact with and (suspected) exposure to other viral communicable diseases: Secondary | ICD-10-CM | POA: Diagnosis not present

## 2020-01-29 DIAGNOSIS — G301 Alzheimer's disease with late onset: Secondary | ICD-10-CM | POA: Diagnosis not present

## 2020-01-29 DIAGNOSIS — Z1159 Encounter for screening for other viral diseases: Secondary | ICD-10-CM | POA: Diagnosis not present

## 2020-01-29 DIAGNOSIS — F0281 Dementia in other diseases classified elsewhere with behavioral disturbance: Secondary | ICD-10-CM | POA: Diagnosis not present

## 2020-01-29 DIAGNOSIS — U071 COVID-19: Secondary | ICD-10-CM | POA: Diagnosis not present

## 2020-01-29 DIAGNOSIS — I10 Essential (primary) hypertension: Secondary | ICD-10-CM | POA: Diagnosis not present

## 2020-02-01 DIAGNOSIS — Z20828 Contact with and (suspected) exposure to other viral communicable diseases: Secondary | ICD-10-CM | POA: Diagnosis not present

## 2020-02-01 DIAGNOSIS — Z1159 Encounter for screening for other viral diseases: Secondary | ICD-10-CM | POA: Diagnosis not present

## 2020-02-05 DIAGNOSIS — Z1159 Encounter for screening for other viral diseases: Secondary | ICD-10-CM | POA: Diagnosis not present

## 2020-02-05 DIAGNOSIS — Z20828 Contact with and (suspected) exposure to other viral communicable diseases: Secondary | ICD-10-CM | POA: Diagnosis not present

## 2020-02-08 DIAGNOSIS — Z1159 Encounter for screening for other viral diseases: Secondary | ICD-10-CM | POA: Diagnosis not present

## 2020-02-08 DIAGNOSIS — Z20828 Contact with and (suspected) exposure to other viral communicable diseases: Secondary | ICD-10-CM | POA: Diagnosis not present

## 2020-02-12 DIAGNOSIS — Z20828 Contact with and (suspected) exposure to other viral communicable diseases: Secondary | ICD-10-CM | POA: Diagnosis not present

## 2020-02-12 DIAGNOSIS — Z1159 Encounter for screening for other viral diseases: Secondary | ICD-10-CM | POA: Diagnosis not present

## 2020-02-15 DIAGNOSIS — Z20828 Contact with and (suspected) exposure to other viral communicable diseases: Secondary | ICD-10-CM | POA: Diagnosis not present

## 2020-02-15 DIAGNOSIS — Z1159 Encounter for screening for other viral diseases: Secondary | ICD-10-CM | POA: Diagnosis not present

## 2020-02-19 DIAGNOSIS — Z1159 Encounter for screening for other viral diseases: Secondary | ICD-10-CM | POA: Diagnosis not present

## 2020-02-19 DIAGNOSIS — Z20828 Contact with and (suspected) exposure to other viral communicable diseases: Secondary | ICD-10-CM | POA: Diagnosis not present

## 2020-02-22 DIAGNOSIS — Z1159 Encounter for screening for other viral diseases: Secondary | ICD-10-CM | POA: Diagnosis not present

## 2020-02-22 DIAGNOSIS — Z20828 Contact with and (suspected) exposure to other viral communicable diseases: Secondary | ICD-10-CM | POA: Diagnosis not present

## 2020-02-26 DIAGNOSIS — G301 Alzheimer's disease with late onset: Secondary | ICD-10-CM | POA: Diagnosis not present

## 2020-02-26 DIAGNOSIS — Z20828 Contact with and (suspected) exposure to other viral communicable diseases: Secondary | ICD-10-CM | POA: Diagnosis not present

## 2020-02-26 DIAGNOSIS — Z1159 Encounter for screening for other viral diseases: Secondary | ICD-10-CM | POA: Diagnosis not present

## 2020-02-26 DIAGNOSIS — W19XXXD Unspecified fall, subsequent encounter: Secondary | ICD-10-CM | POA: Diagnosis not present

## 2020-02-26 DIAGNOSIS — F0281 Dementia in other diseases classified elsewhere with behavioral disturbance: Secondary | ICD-10-CM | POA: Diagnosis not present

## 2020-02-26 DIAGNOSIS — I10 Essential (primary) hypertension: Secondary | ICD-10-CM | POA: Diagnosis not present

## 2020-02-29 DIAGNOSIS — Z20828 Contact with and (suspected) exposure to other viral communicable diseases: Secondary | ICD-10-CM | POA: Diagnosis not present

## 2020-02-29 DIAGNOSIS — Z1159 Encounter for screening for other viral diseases: Secondary | ICD-10-CM | POA: Diagnosis not present

## 2020-03-04 DIAGNOSIS — Z20828 Contact with and (suspected) exposure to other viral communicable diseases: Secondary | ICD-10-CM | POA: Diagnosis not present

## 2020-03-04 DIAGNOSIS — Z1159 Encounter for screening for other viral diseases: Secondary | ICD-10-CM | POA: Diagnosis not present

## 2020-03-07 DIAGNOSIS — Z20828 Contact with and (suspected) exposure to other viral communicable diseases: Secondary | ICD-10-CM | POA: Diagnosis not present

## 2020-03-07 DIAGNOSIS — Z1159 Encounter for screening for other viral diseases: Secondary | ICD-10-CM | POA: Diagnosis not present

## 2020-03-11 DIAGNOSIS — Z1159 Encounter for screening for other viral diseases: Secondary | ICD-10-CM | POA: Diagnosis not present

## 2020-03-11 DIAGNOSIS — Z20828 Contact with and (suspected) exposure to other viral communicable diseases: Secondary | ICD-10-CM | POA: Diagnosis not present

## 2020-03-14 DIAGNOSIS — Z1159 Encounter for screening for other viral diseases: Secondary | ICD-10-CM | POA: Diagnosis not present

## 2020-03-14 DIAGNOSIS — Z20828 Contact with and (suspected) exposure to other viral communicable diseases: Secondary | ICD-10-CM | POA: Diagnosis not present

## 2020-03-18 DIAGNOSIS — Z20828 Contact with and (suspected) exposure to other viral communicable diseases: Secondary | ICD-10-CM | POA: Diagnosis not present

## 2020-03-18 DIAGNOSIS — Z1159 Encounter for screening for other viral diseases: Secondary | ICD-10-CM | POA: Diagnosis not present

## 2020-03-21 DIAGNOSIS — Z20828 Contact with and (suspected) exposure to other viral communicable diseases: Secondary | ICD-10-CM | POA: Diagnosis not present

## 2020-03-21 DIAGNOSIS — Z1159 Encounter for screening for other viral diseases: Secondary | ICD-10-CM | POA: Diagnosis not present

## 2020-03-25 DIAGNOSIS — Z20828 Contact with and (suspected) exposure to other viral communicable diseases: Secondary | ICD-10-CM | POA: Diagnosis not present

## 2020-03-25 DIAGNOSIS — Z1159 Encounter for screening for other viral diseases: Secondary | ICD-10-CM | POA: Diagnosis not present

## 2020-03-28 DIAGNOSIS — Z1159 Encounter for screening for other viral diseases: Secondary | ICD-10-CM | POA: Diagnosis not present

## 2020-03-28 DIAGNOSIS — Z20828 Contact with and (suspected) exposure to other viral communicable diseases: Secondary | ICD-10-CM | POA: Diagnosis not present

## 2020-04-01 DIAGNOSIS — H40052 Ocular hypertension, left eye: Secondary | ICD-10-CM | POA: Diagnosis not present

## 2020-04-01 DIAGNOSIS — F0281 Dementia in other diseases classified elsewhere with behavioral disturbance: Secondary | ICD-10-CM | POA: Diagnosis not present

## 2020-04-01 DIAGNOSIS — I10 Essential (primary) hypertension: Secondary | ICD-10-CM | POA: Diagnosis not present

## 2020-04-01 DIAGNOSIS — G301 Alzheimer's disease with late onset: Secondary | ICD-10-CM | POA: Diagnosis not present

## 2020-04-01 DIAGNOSIS — H2512 Age-related nuclear cataract, left eye: Secondary | ICD-10-CM | POA: Diagnosis not present

## 2020-04-01 DIAGNOSIS — K5901 Slow transit constipation: Secondary | ICD-10-CM | POA: Diagnosis not present

## 2020-04-04 DIAGNOSIS — Z1159 Encounter for screening for other viral diseases: Secondary | ICD-10-CM | POA: Diagnosis not present

## 2020-04-04 DIAGNOSIS — Z20828 Contact with and (suspected) exposure to other viral communicable diseases: Secondary | ICD-10-CM | POA: Diagnosis not present

## 2020-04-08 DIAGNOSIS — Z1159 Encounter for screening for other viral diseases: Secondary | ICD-10-CM | POA: Diagnosis not present

## 2020-04-08 DIAGNOSIS — Z20828 Contact with and (suspected) exposure to other viral communicable diseases: Secondary | ICD-10-CM | POA: Diagnosis not present

## 2020-04-15 DIAGNOSIS — Z20828 Contact with and (suspected) exposure to other viral communicable diseases: Secondary | ICD-10-CM | POA: Diagnosis not present

## 2020-04-15 DIAGNOSIS — Z1159 Encounter for screening for other viral diseases: Secondary | ICD-10-CM | POA: Diagnosis not present

## 2020-04-18 DIAGNOSIS — Z1159 Encounter for screening for other viral diseases: Secondary | ICD-10-CM | POA: Diagnosis not present

## 2020-04-18 DIAGNOSIS — Z20828 Contact with and (suspected) exposure to other viral communicable diseases: Secondary | ICD-10-CM | POA: Diagnosis not present

## 2020-04-22 DIAGNOSIS — Z20828 Contact with and (suspected) exposure to other viral communicable diseases: Secondary | ICD-10-CM | POA: Diagnosis not present

## 2020-04-22 DIAGNOSIS — Z1159 Encounter for screening for other viral diseases: Secondary | ICD-10-CM | POA: Diagnosis not present

## 2020-04-25 DIAGNOSIS — Z20828 Contact with and (suspected) exposure to other viral communicable diseases: Secondary | ICD-10-CM | POA: Diagnosis not present

## 2020-04-25 DIAGNOSIS — Z1159 Encounter for screening for other viral diseases: Secondary | ICD-10-CM | POA: Diagnosis not present

## 2020-04-28 IMAGING — DX PORTABLE CHEST - 1 VIEW
1 series · 1 of 1 positions shown · non-contrast
Comparison: Chest radiograph dated 12/13/2017

CLINICAL DATA: 83-year-old female with cough.

EXAM:
PORTABLE CHEST 1 VIEW

[chest ap]
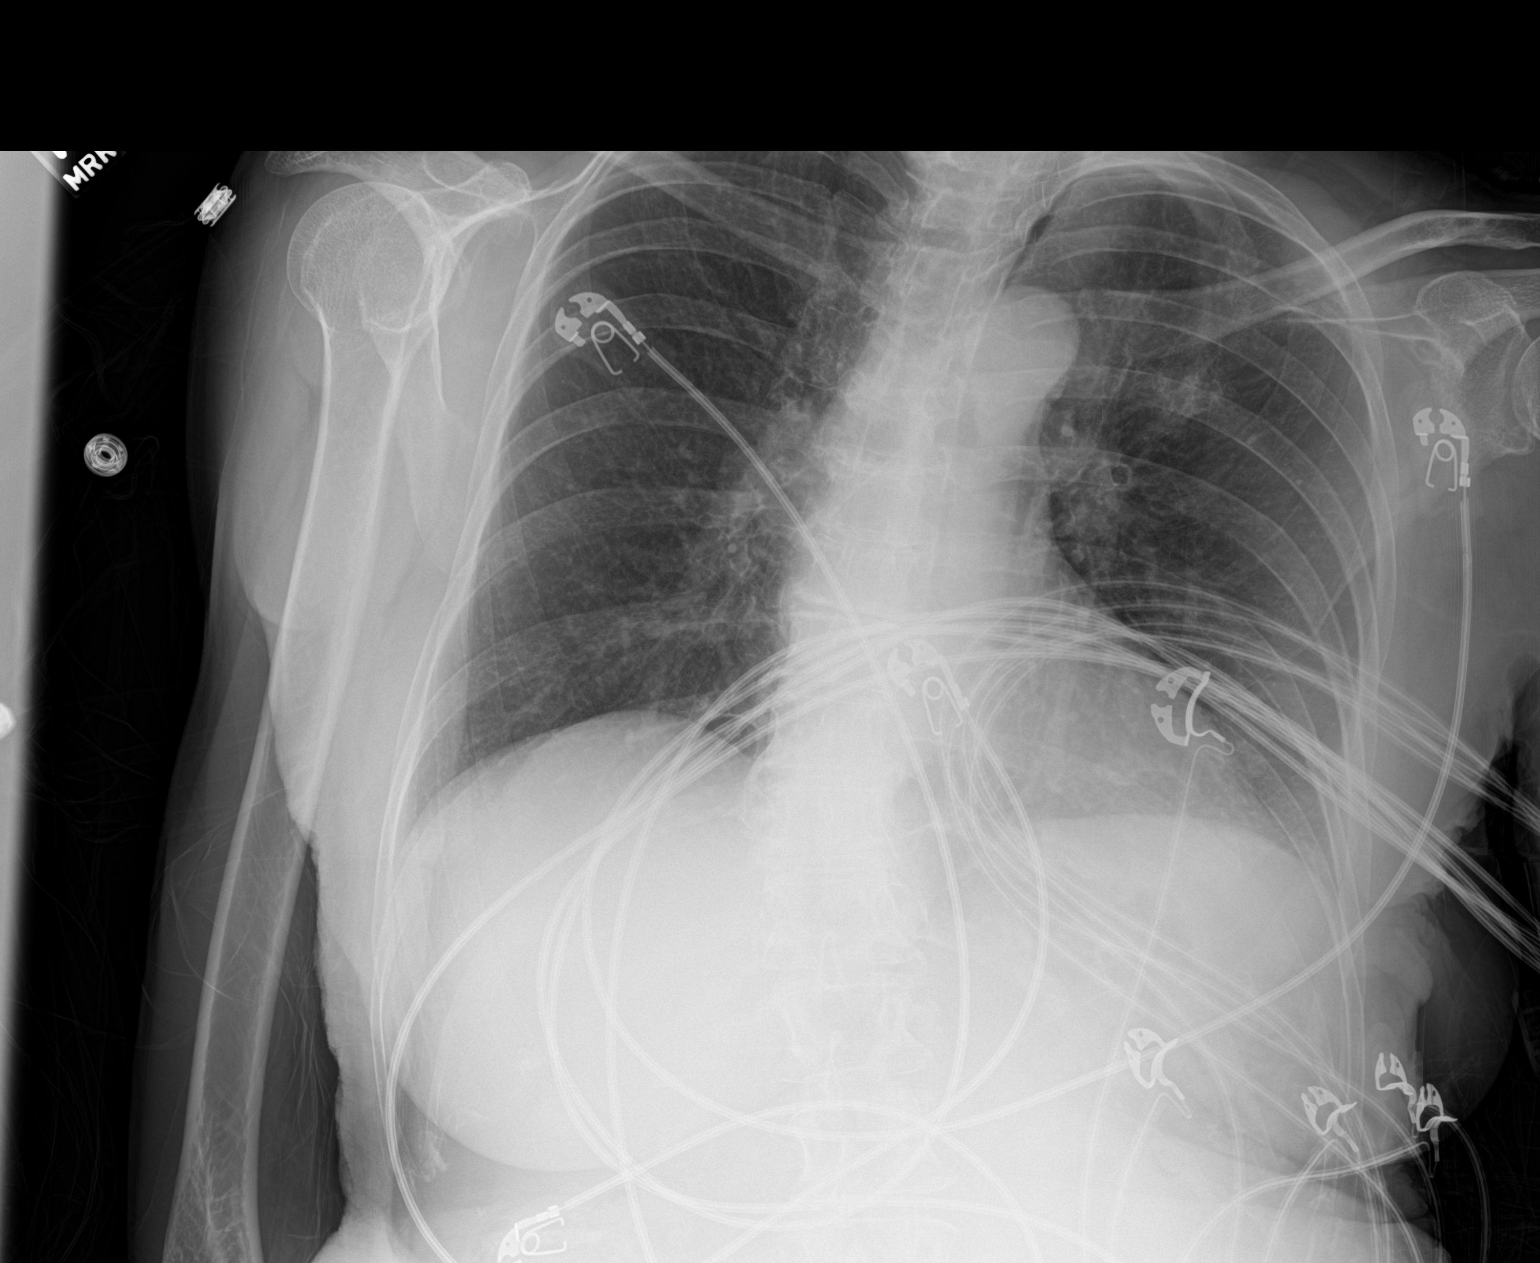

[1 of 1 positions shown; findings below may reference images not displayed]

FINDINGS: The patient is tilted to the left. Minimal left lung base
atelectatic changes. No focal consolidation, or pleural effusion.
Thin lucency along the upper mediastinum to the left of midline,
likely artifact. A pneumothorax is less likely. The cardiac
silhouette is within normal limits. No acute osseous pathology.
IMPRESSION: No active disease.

## 2020-04-29 DIAGNOSIS — G301 Alzheimer's disease with late onset: Secondary | ICD-10-CM | POA: Diagnosis not present

## 2020-04-29 DIAGNOSIS — Z20828 Contact with and (suspected) exposure to other viral communicable diseases: Secondary | ICD-10-CM | POA: Diagnosis not present

## 2020-04-29 DIAGNOSIS — Z1159 Encounter for screening for other viral diseases: Secondary | ICD-10-CM | POA: Diagnosis not present

## 2020-04-29 DIAGNOSIS — D509 Iron deficiency anemia, unspecified: Secondary | ICD-10-CM | POA: Diagnosis not present

## 2020-04-29 DIAGNOSIS — F0281 Dementia in other diseases classified elsewhere with behavioral disturbance: Secondary | ICD-10-CM | POA: Diagnosis not present

## 2020-04-29 DIAGNOSIS — I10 Essential (primary) hypertension: Secondary | ICD-10-CM | POA: Diagnosis not present

## 2020-05-02 DIAGNOSIS — Z1159 Encounter for screening for other viral diseases: Secondary | ICD-10-CM | POA: Diagnosis not present

## 2020-05-02 DIAGNOSIS — Z20828 Contact with and (suspected) exposure to other viral communicable diseases: Secondary | ICD-10-CM | POA: Diagnosis not present

## 2020-05-06 DIAGNOSIS — Z20828 Contact with and (suspected) exposure to other viral communicable diseases: Secondary | ICD-10-CM | POA: Diagnosis not present

## 2020-05-06 DIAGNOSIS — Z1159 Encounter for screening for other viral diseases: Secondary | ICD-10-CM | POA: Diagnosis not present

## 2020-05-09 DIAGNOSIS — Z1159 Encounter for screening for other viral diseases: Secondary | ICD-10-CM | POA: Diagnosis not present

## 2020-05-09 DIAGNOSIS — Z20828 Contact with and (suspected) exposure to other viral communicable diseases: Secondary | ICD-10-CM | POA: Diagnosis not present

## 2020-05-13 DIAGNOSIS — Z1159 Encounter for screening for other viral diseases: Secondary | ICD-10-CM | POA: Diagnosis not present

## 2020-05-13 DIAGNOSIS — Z20828 Contact with and (suspected) exposure to other viral communicable diseases: Secondary | ICD-10-CM | POA: Diagnosis not present

## 2020-05-16 DIAGNOSIS — Z20828 Contact with and (suspected) exposure to other viral communicable diseases: Secondary | ICD-10-CM | POA: Diagnosis not present

## 2020-05-16 DIAGNOSIS — Z1159 Encounter for screening for other viral diseases: Secondary | ICD-10-CM | POA: Diagnosis not present

## 2020-05-20 DIAGNOSIS — Z1159 Encounter for screening for other viral diseases: Secondary | ICD-10-CM | POA: Diagnosis not present

## 2020-05-20 DIAGNOSIS — Z20828 Contact with and (suspected) exposure to other viral communicable diseases: Secondary | ICD-10-CM | POA: Diagnosis not present

## 2020-05-23 DIAGNOSIS — Z20828 Contact with and (suspected) exposure to other viral communicable diseases: Secondary | ICD-10-CM | POA: Diagnosis not present

## 2020-05-23 DIAGNOSIS — Z1159 Encounter for screening for other viral diseases: Secondary | ICD-10-CM | POA: Diagnosis not present

## 2020-05-27 DIAGNOSIS — I10 Essential (primary) hypertension: Secondary | ICD-10-CM | POA: Diagnosis not present

## 2020-05-27 DIAGNOSIS — F0281 Dementia in other diseases classified elsewhere with behavioral disturbance: Secondary | ICD-10-CM | POA: Diagnosis not present

## 2020-05-27 DIAGNOSIS — Z1159 Encounter for screening for other viral diseases: Secondary | ICD-10-CM | POA: Diagnosis not present

## 2020-05-27 DIAGNOSIS — F5101 Primary insomnia: Secondary | ICD-10-CM | POA: Diagnosis not present

## 2020-05-27 DIAGNOSIS — G301 Alzheimer's disease with late onset: Secondary | ICD-10-CM | POA: Diagnosis not present

## 2020-05-27 DIAGNOSIS — Z20828 Contact with and (suspected) exposure to other viral communicable diseases: Secondary | ICD-10-CM | POA: Diagnosis not present

## 2020-05-30 DIAGNOSIS — Z1159 Encounter for screening for other viral diseases: Secondary | ICD-10-CM | POA: Diagnosis not present

## 2020-05-30 DIAGNOSIS — Z20828 Contact with and (suspected) exposure to other viral communicable diseases: Secondary | ICD-10-CM | POA: Diagnosis not present

## 2020-06-03 DIAGNOSIS — Z1159 Encounter for screening for other viral diseases: Secondary | ICD-10-CM | POA: Diagnosis not present

## 2020-06-03 DIAGNOSIS — Z20828 Contact with and (suspected) exposure to other viral communicable diseases: Secondary | ICD-10-CM | POA: Diagnosis not present

## 2020-06-06 DIAGNOSIS — Z1159 Encounter for screening for other viral diseases: Secondary | ICD-10-CM | POA: Diagnosis not present

## 2020-06-06 DIAGNOSIS — Z20828 Contact with and (suspected) exposure to other viral communicable diseases: Secondary | ICD-10-CM | POA: Diagnosis not present

## 2020-06-10 DIAGNOSIS — Z20828 Contact with and (suspected) exposure to other viral communicable diseases: Secondary | ICD-10-CM | POA: Diagnosis not present

## 2020-06-10 DIAGNOSIS — Z1159 Encounter for screening for other viral diseases: Secondary | ICD-10-CM | POA: Diagnosis not present

## 2020-06-13 DIAGNOSIS — Z20828 Contact with and (suspected) exposure to other viral communicable diseases: Secondary | ICD-10-CM | POA: Diagnosis not present

## 2020-06-13 DIAGNOSIS — Z1159 Encounter for screening for other viral diseases: Secondary | ICD-10-CM | POA: Diagnosis not present

## 2020-06-20 DIAGNOSIS — Z1159 Encounter for screening for other viral diseases: Secondary | ICD-10-CM | POA: Diagnosis not present

## 2020-06-20 DIAGNOSIS — Z20828 Contact with and (suspected) exposure to other viral communicable diseases: Secondary | ICD-10-CM | POA: Diagnosis not present

## 2020-06-24 DIAGNOSIS — Z1159 Encounter for screening for other viral diseases: Secondary | ICD-10-CM | POA: Diagnosis not present

## 2020-06-24 DIAGNOSIS — F0281 Dementia in other diseases classified elsewhere with behavioral disturbance: Secondary | ICD-10-CM | POA: Diagnosis not present

## 2020-06-24 DIAGNOSIS — G301 Alzheimer's disease with late onset: Secondary | ICD-10-CM | POA: Diagnosis not present

## 2020-06-24 DIAGNOSIS — I10 Essential (primary) hypertension: Secondary | ICD-10-CM | POA: Diagnosis not present

## 2020-06-24 DIAGNOSIS — F5101 Primary insomnia: Secondary | ICD-10-CM | POA: Diagnosis not present

## 2020-06-24 DIAGNOSIS — Z20828 Contact with and (suspected) exposure to other viral communicable diseases: Secondary | ICD-10-CM | POA: Diagnosis not present

## 2020-06-27 DIAGNOSIS — Z1159 Encounter for screening for other viral diseases: Secondary | ICD-10-CM | POA: Diagnosis not present

## 2020-06-27 DIAGNOSIS — Z20828 Contact with and (suspected) exposure to other viral communicable diseases: Secondary | ICD-10-CM | POA: Diagnosis not present

## 2020-07-01 DIAGNOSIS — Z1159 Encounter for screening for other viral diseases: Secondary | ICD-10-CM | POA: Diagnosis not present

## 2020-07-01 DIAGNOSIS — Z20828 Contact with and (suspected) exposure to other viral communicable diseases: Secondary | ICD-10-CM | POA: Diagnosis not present

## 2020-07-04 DIAGNOSIS — Z20828 Contact with and (suspected) exposure to other viral communicable diseases: Secondary | ICD-10-CM | POA: Diagnosis not present

## 2020-07-04 DIAGNOSIS — Z1159 Encounter for screening for other viral diseases: Secondary | ICD-10-CM | POA: Diagnosis not present

## 2020-07-08 DIAGNOSIS — Z20828 Contact with and (suspected) exposure to other viral communicable diseases: Secondary | ICD-10-CM | POA: Diagnosis not present

## 2020-07-08 DIAGNOSIS — Z1159 Encounter for screening for other viral diseases: Secondary | ICD-10-CM | POA: Diagnosis not present

## 2020-07-11 DIAGNOSIS — Z20828 Contact with and (suspected) exposure to other viral communicable diseases: Secondary | ICD-10-CM | POA: Diagnosis not present

## 2020-07-11 DIAGNOSIS — Z1159 Encounter for screening for other viral diseases: Secondary | ICD-10-CM | POA: Diagnosis not present

## 2020-07-15 DIAGNOSIS — Z1159 Encounter for screening for other viral diseases: Secondary | ICD-10-CM | POA: Diagnosis not present

## 2020-07-15 DIAGNOSIS — Z20828 Contact with and (suspected) exposure to other viral communicable diseases: Secondary | ICD-10-CM | POA: Diagnosis not present

## 2020-07-18 DIAGNOSIS — Z20828 Contact with and (suspected) exposure to other viral communicable diseases: Secondary | ICD-10-CM | POA: Diagnosis not present

## 2020-07-18 DIAGNOSIS — Z1159 Encounter for screening for other viral diseases: Secondary | ICD-10-CM | POA: Diagnosis not present

## 2020-07-22 DIAGNOSIS — F0281 Dementia in other diseases classified elsewhere with behavioral disturbance: Secondary | ICD-10-CM | POA: Diagnosis not present

## 2020-07-22 DIAGNOSIS — I10 Essential (primary) hypertension: Secondary | ICD-10-CM | POA: Diagnosis not present

## 2020-07-22 DIAGNOSIS — Z20828 Contact with and (suspected) exposure to other viral communicable diseases: Secondary | ICD-10-CM | POA: Diagnosis not present

## 2020-07-22 DIAGNOSIS — G301 Alzheimer's disease with late onset: Secondary | ICD-10-CM | POA: Diagnosis not present

## 2020-07-22 DIAGNOSIS — K5901 Slow transit constipation: Secondary | ICD-10-CM | POA: Diagnosis not present

## 2020-07-22 DIAGNOSIS — Z1159 Encounter for screening for other viral diseases: Secondary | ICD-10-CM | POA: Diagnosis not present

## 2020-07-25 DIAGNOSIS — Z1159 Encounter for screening for other viral diseases: Secondary | ICD-10-CM | POA: Diagnosis not present

## 2020-07-25 DIAGNOSIS — Z20828 Contact with and (suspected) exposure to other viral communicable diseases: Secondary | ICD-10-CM | POA: Diagnosis not present

## 2020-07-29 DIAGNOSIS — Z1159 Encounter for screening for other viral diseases: Secondary | ICD-10-CM | POA: Diagnosis not present

## 2020-07-29 DIAGNOSIS — Z20828 Contact with and (suspected) exposure to other viral communicable diseases: Secondary | ICD-10-CM | POA: Diagnosis not present

## 2020-08-01 DIAGNOSIS — Z1159 Encounter for screening for other viral diseases: Secondary | ICD-10-CM | POA: Diagnosis not present

## 2020-08-01 DIAGNOSIS — Z20828 Contact with and (suspected) exposure to other viral communicable diseases: Secondary | ICD-10-CM | POA: Diagnosis not present

## 2020-08-08 DIAGNOSIS — Z1159 Encounter for screening for other viral diseases: Secondary | ICD-10-CM | POA: Diagnosis not present

## 2020-08-08 DIAGNOSIS — Z20828 Contact with and (suspected) exposure to other viral communicable diseases: Secondary | ICD-10-CM | POA: Diagnosis not present

## 2020-08-12 DIAGNOSIS — Z1159 Encounter for screening for other viral diseases: Secondary | ICD-10-CM | POA: Diagnosis not present

## 2020-08-12 DIAGNOSIS — Z20828 Contact with and (suspected) exposure to other viral communicable diseases: Secondary | ICD-10-CM | POA: Diagnosis not present

## 2020-08-15 DIAGNOSIS — D509 Iron deficiency anemia, unspecified: Secondary | ICD-10-CM | POA: Diagnosis not present

## 2020-08-15 DIAGNOSIS — F331 Major depressive disorder, recurrent, moderate: Secondary | ICD-10-CM | POA: Diagnosis not present

## 2020-08-15 DIAGNOSIS — I10 Essential (primary) hypertension: Secondary | ICD-10-CM | POA: Diagnosis not present

## 2020-08-15 DIAGNOSIS — G301 Alzheimer's disease with late onset: Secondary | ICD-10-CM | POA: Diagnosis not present

## 2020-08-15 DIAGNOSIS — Z1159 Encounter for screening for other viral diseases: Secondary | ICD-10-CM | POA: Diagnosis not present

## 2020-08-15 DIAGNOSIS — Z20828 Contact with and (suspected) exposure to other viral communicable diseases: Secondary | ICD-10-CM | POA: Diagnosis not present

## 2020-08-19 DIAGNOSIS — F5101 Primary insomnia: Secondary | ICD-10-CM | POA: Diagnosis not present

## 2020-08-19 DIAGNOSIS — G301 Alzheimer's disease with late onset: Secondary | ICD-10-CM | POA: Diagnosis not present

## 2020-08-19 DIAGNOSIS — F0281 Dementia in other diseases classified elsewhere with behavioral disturbance: Secondary | ICD-10-CM | POA: Diagnosis not present

## 2020-08-19 DIAGNOSIS — Z1159 Encounter for screening for other viral diseases: Secondary | ICD-10-CM | POA: Diagnosis not present

## 2020-08-19 DIAGNOSIS — I10 Essential (primary) hypertension: Secondary | ICD-10-CM | POA: Diagnosis not present

## 2020-08-19 DIAGNOSIS — Z20828 Contact with and (suspected) exposure to other viral communicable diseases: Secondary | ICD-10-CM | POA: Diagnosis not present

## 2020-08-22 DIAGNOSIS — Z20828 Contact with and (suspected) exposure to other viral communicable diseases: Secondary | ICD-10-CM | POA: Diagnosis not present

## 2020-08-22 DIAGNOSIS — Z1159 Encounter for screening for other viral diseases: Secondary | ICD-10-CM | POA: Diagnosis not present

## 2020-09-04 DIAGNOSIS — Z20828 Contact with and (suspected) exposure to other viral communicable diseases: Secondary | ICD-10-CM | POA: Diagnosis not present

## 2020-09-18 DIAGNOSIS — Z20828 Contact with and (suspected) exposure to other viral communicable diseases: Secondary | ICD-10-CM | POA: Diagnosis not present

## 2020-09-25 DIAGNOSIS — Z20828 Contact with and (suspected) exposure to other viral communicable diseases: Secondary | ICD-10-CM | POA: Diagnosis not present

## 2020-10-02 DIAGNOSIS — Z20828 Contact with and (suspected) exposure to other viral communicable diseases: Secondary | ICD-10-CM | POA: Diagnosis not present

## 2020-10-07 DIAGNOSIS — F0281 Dementia in other diseases classified elsewhere with behavioral disturbance: Secondary | ICD-10-CM | POA: Diagnosis not present

## 2020-10-07 DIAGNOSIS — G301 Alzheimer's disease with late onset: Secondary | ICD-10-CM | POA: Diagnosis not present

## 2020-10-07 DIAGNOSIS — I10 Essential (primary) hypertension: Secondary | ICD-10-CM | POA: Diagnosis not present

## 2020-10-07 DIAGNOSIS — F331 Major depressive disorder, recurrent, moderate: Secondary | ICD-10-CM | POA: Diagnosis not present

## 2020-10-16 DIAGNOSIS — Z20828 Contact with and (suspected) exposure to other viral communicable diseases: Secondary | ICD-10-CM | POA: Diagnosis not present

## 2020-10-23 DIAGNOSIS — Z20828 Contact with and (suspected) exposure to other viral communicable diseases: Secondary | ICD-10-CM | POA: Diagnosis not present

## 2020-10-30 DIAGNOSIS — Z20828 Contact with and (suspected) exposure to other viral communicable diseases: Secondary | ICD-10-CM | POA: Diagnosis not present

## 2020-11-06 DIAGNOSIS — Z20828 Contact with and (suspected) exposure to other viral communicable diseases: Secondary | ICD-10-CM | POA: Diagnosis not present

## 2020-11-13 DIAGNOSIS — Z20828 Contact with and (suspected) exposure to other viral communicable diseases: Secondary | ICD-10-CM | POA: Diagnosis not present

## 2020-11-18 DIAGNOSIS — Z79899 Other long term (current) drug therapy: Secondary | ICD-10-CM | POA: Diagnosis not present

## 2020-11-18 DIAGNOSIS — I10 Essential (primary) hypertension: Secondary | ICD-10-CM | POA: Diagnosis not present

## 2020-11-18 DIAGNOSIS — G301 Alzheimer's disease with late onset: Secondary | ICD-10-CM | POA: Diagnosis not present

## 2020-11-18 DIAGNOSIS — F331 Major depressive disorder, recurrent, moderate: Secondary | ICD-10-CM | POA: Diagnosis not present

## 2020-11-20 DIAGNOSIS — Z20822 Contact with and (suspected) exposure to covid-19: Secondary | ICD-10-CM | POA: Diagnosis not present

## 2020-11-21 DIAGNOSIS — F5101 Primary insomnia: Secondary | ICD-10-CM | POA: Diagnosis not present

## 2020-11-21 DIAGNOSIS — D509 Iron deficiency anemia, unspecified: Secondary | ICD-10-CM | POA: Diagnosis not present

## 2020-11-21 DIAGNOSIS — Z79899 Other long term (current) drug therapy: Secondary | ICD-10-CM | POA: Diagnosis not present

## 2020-11-21 DIAGNOSIS — I10 Essential (primary) hypertension: Secondary | ICD-10-CM | POA: Diagnosis not present

## 2020-11-21 DIAGNOSIS — K5901 Slow transit constipation: Secondary | ICD-10-CM | POA: Diagnosis not present

## 2020-11-27 DIAGNOSIS — Z20822 Contact with and (suspected) exposure to covid-19: Secondary | ICD-10-CM | POA: Diagnosis not present

## 2020-12-04 DIAGNOSIS — Z20828 Contact with and (suspected) exposure to other viral communicable diseases: Secondary | ICD-10-CM | POA: Diagnosis not present

## 2020-12-11 DIAGNOSIS — Z20828 Contact with and (suspected) exposure to other viral communicable diseases: Secondary | ICD-10-CM | POA: Diagnosis not present

## 2020-12-16 DIAGNOSIS — Z20828 Contact with and (suspected) exposure to other viral communicable diseases: Secondary | ICD-10-CM | POA: Diagnosis not present

## 2020-12-16 DIAGNOSIS — Z1159 Encounter for screening for other viral diseases: Secondary | ICD-10-CM | POA: Diagnosis not present

## 2020-12-23 DIAGNOSIS — Z20828 Contact with and (suspected) exposure to other viral communicable diseases: Secondary | ICD-10-CM | POA: Diagnosis not present

## 2020-12-23 DIAGNOSIS — Z1159 Encounter for screening for other viral diseases: Secondary | ICD-10-CM | POA: Diagnosis not present

## 2020-12-30 DIAGNOSIS — Z1159 Encounter for screening for other viral diseases: Secondary | ICD-10-CM | POA: Diagnosis not present

## 2020-12-30 DIAGNOSIS — Z20828 Contact with and (suspected) exposure to other viral communicable diseases: Secondary | ICD-10-CM | POA: Diagnosis not present

## 2021-01-01 DIAGNOSIS — Z20828 Contact with and (suspected) exposure to other viral communicable diseases: Secondary | ICD-10-CM | POA: Diagnosis not present

## 2021-01-01 DIAGNOSIS — Z1159 Encounter for screening for other viral diseases: Secondary | ICD-10-CM | POA: Diagnosis not present

## 2021-01-03 DIAGNOSIS — Z1159 Encounter for screening for other viral diseases: Secondary | ICD-10-CM | POA: Diagnosis not present

## 2021-01-03 DIAGNOSIS — Z20828 Contact with and (suspected) exposure to other viral communicable diseases: Secondary | ICD-10-CM | POA: Diagnosis not present

## 2021-01-06 DIAGNOSIS — Z20828 Contact with and (suspected) exposure to other viral communicable diseases: Secondary | ICD-10-CM | POA: Diagnosis not present

## 2021-01-06 DIAGNOSIS — Z1159 Encounter for screening for other viral diseases: Secondary | ICD-10-CM | POA: Diagnosis not present

## 2021-01-08 DIAGNOSIS — Z20828 Contact with and (suspected) exposure to other viral communicable diseases: Secondary | ICD-10-CM | POA: Diagnosis not present

## 2021-01-08 DIAGNOSIS — Z1159 Encounter for screening for other viral diseases: Secondary | ICD-10-CM | POA: Diagnosis not present

## 2021-01-13 DIAGNOSIS — Z20828 Contact with and (suspected) exposure to other viral communicable diseases: Secondary | ICD-10-CM | POA: Diagnosis not present

## 2021-01-13 DIAGNOSIS — Z1159 Encounter for screening for other viral diseases: Secondary | ICD-10-CM | POA: Diagnosis not present

## 2021-01-15 DIAGNOSIS — Z20828 Contact with and (suspected) exposure to other viral communicable diseases: Secondary | ICD-10-CM | POA: Diagnosis not present

## 2021-01-15 DIAGNOSIS — Z1159 Encounter for screening for other viral diseases: Secondary | ICD-10-CM | POA: Diagnosis not present

## 2021-01-20 DIAGNOSIS — Z20822 Contact with and (suspected) exposure to covid-19: Secondary | ICD-10-CM | POA: Diagnosis not present

## 2021-01-22 DIAGNOSIS — Z20828 Contact with and (suspected) exposure to other viral communicable diseases: Secondary | ICD-10-CM | POA: Diagnosis not present

## 2021-01-22 DIAGNOSIS — Z1159 Encounter for screening for other viral diseases: Secondary | ICD-10-CM | POA: Diagnosis not present

## 2021-01-27 DIAGNOSIS — Z20822 Contact with and (suspected) exposure to covid-19: Secondary | ICD-10-CM | POA: Diagnosis not present

## 2021-01-29 DIAGNOSIS — Z20828 Contact with and (suspected) exposure to other viral communicable diseases: Secondary | ICD-10-CM | POA: Diagnosis not present

## 2021-01-29 DIAGNOSIS — Z1159 Encounter for screening for other viral diseases: Secondary | ICD-10-CM | POA: Diagnosis not present

## 2021-02-03 DIAGNOSIS — Z20822 Contact with and (suspected) exposure to covid-19: Secondary | ICD-10-CM | POA: Diagnosis not present

## 2021-02-10 DIAGNOSIS — G301 Alzheimer's disease with late onset: Secondary | ICD-10-CM | POA: Diagnosis not present

## 2021-02-10 DIAGNOSIS — Z20822 Contact with and (suspected) exposure to covid-19: Secondary | ICD-10-CM | POA: Diagnosis not present

## 2021-02-10 DIAGNOSIS — F331 Major depressive disorder, recurrent, moderate: Secondary | ICD-10-CM | POA: Diagnosis not present

## 2021-02-10 DIAGNOSIS — F02C3 Dementia in other diseases classified elsewhere, severe, with mood disturbance: Secondary | ICD-10-CM | POA: Diagnosis not present

## 2021-02-10 DIAGNOSIS — I10 Essential (primary) hypertension: Secondary | ICD-10-CM | POA: Diagnosis not present

## 2021-02-17 DIAGNOSIS — Z20822 Contact with and (suspected) exposure to covid-19: Secondary | ICD-10-CM | POA: Diagnosis not present

## 2021-03-10 DIAGNOSIS — I10 Essential (primary) hypertension: Secondary | ICD-10-CM | POA: Diagnosis not present

## 2021-03-10 DIAGNOSIS — F331 Major depressive disorder, recurrent, moderate: Secondary | ICD-10-CM | POA: Diagnosis not present

## 2021-03-10 DIAGNOSIS — F02C3 Dementia in other diseases classified elsewhere, severe, with mood disturbance: Secondary | ICD-10-CM | POA: Diagnosis not present

## 2021-03-10 DIAGNOSIS — G301 Alzheimer's disease with late onset: Secondary | ICD-10-CM | POA: Diagnosis not present

## 2021-04-01 DIAGNOSIS — Z20828 Contact with and (suspected) exposure to other viral communicable diseases: Secondary | ICD-10-CM | POA: Diagnosis not present

## 2021-04-07 DIAGNOSIS — F02C3 Dementia in other diseases classified elsewhere, severe, with mood disturbance: Secondary | ICD-10-CM | POA: Diagnosis not present

## 2021-04-07 DIAGNOSIS — I10 Essential (primary) hypertension: Secondary | ICD-10-CM | POA: Diagnosis not present

## 2021-04-07 DIAGNOSIS — G301 Alzheimer's disease with late onset: Secondary | ICD-10-CM | POA: Diagnosis not present

## 2021-04-07 DIAGNOSIS — F5104 Psychophysiologic insomnia: Secondary | ICD-10-CM | POA: Diagnosis not present

## 2021-04-08 DIAGNOSIS — H2512 Age-related nuclear cataract, left eye: Secondary | ICD-10-CM | POA: Diagnosis not present

## 2021-04-08 DIAGNOSIS — H40052 Ocular hypertension, left eye: Secondary | ICD-10-CM | POA: Diagnosis not present

## 2021-04-08 DIAGNOSIS — H16011 Central corneal ulcer, right eye: Secondary | ICD-10-CM | POA: Diagnosis not present

## 2021-04-11 DIAGNOSIS — H01004 Unspecified blepharitis left upper eyelid: Secondary | ICD-10-CM | POA: Diagnosis not present

## 2021-04-11 DIAGNOSIS — G301 Alzheimer's disease with late onset: Secondary | ICD-10-CM | POA: Diagnosis not present

## 2021-04-11 DIAGNOSIS — H01001 Unspecified blepharitis right upper eyelid: Secondary | ICD-10-CM | POA: Diagnosis not present

## 2021-04-11 DIAGNOSIS — H16011 Central corneal ulcer, right eye: Secondary | ICD-10-CM | POA: Diagnosis not present

## 2021-04-11 DIAGNOSIS — F331 Major depressive disorder, recurrent, moderate: Secondary | ICD-10-CM | POA: Diagnosis not present

## 2021-04-18 DIAGNOSIS — H16011 Central corneal ulcer, right eye: Secondary | ICD-10-CM | POA: Diagnosis not present

## 2021-05-12 DIAGNOSIS — H40053 Ocular hypertension, bilateral: Secondary | ICD-10-CM | POA: Diagnosis not present

## 2021-05-12 DIAGNOSIS — I10 Essential (primary) hypertension: Secondary | ICD-10-CM | POA: Diagnosis not present

## 2021-05-12 DIAGNOSIS — R451 Restlessness and agitation: Secondary | ICD-10-CM | POA: Diagnosis not present

## 2021-05-12 DIAGNOSIS — F02C3 Dementia in other diseases classified elsewhere, severe, with mood disturbance: Secondary | ICD-10-CM | POA: Diagnosis not present

## 2021-05-22 DIAGNOSIS — R634 Abnormal weight loss: Secondary | ICD-10-CM | POA: Diagnosis not present

## 2021-05-22 DIAGNOSIS — G301 Alzheimer's disease with late onset: Secondary | ICD-10-CM | POA: Diagnosis not present

## 2021-05-22 DIAGNOSIS — K5901 Slow transit constipation: Secondary | ICD-10-CM | POA: Diagnosis not present

## 2021-05-22 DIAGNOSIS — I10 Essential (primary) hypertension: Secondary | ICD-10-CM | POA: Diagnosis not present

## 2021-05-30 DIAGNOSIS — G301 Alzheimer's disease with late onset: Secondary | ICD-10-CM | POA: Diagnosis not present

## 2021-05-30 DIAGNOSIS — F02B4 Dementia in other diseases classified elsewhere, moderate, with anxiety: Secondary | ICD-10-CM | POA: Diagnosis not present

## 2021-06-05 DIAGNOSIS — I739 Peripheral vascular disease, unspecified: Secondary | ICD-10-CM | POA: Diagnosis not present

## 2021-06-05 DIAGNOSIS — H40059 Ocular hypertension, unspecified eye: Secondary | ICD-10-CM | POA: Diagnosis not present

## 2021-06-09 DIAGNOSIS — I10 Essential (primary) hypertension: Secondary | ICD-10-CM | POA: Diagnosis not present

## 2021-06-09 DIAGNOSIS — R451 Restlessness and agitation: Secondary | ICD-10-CM | POA: Diagnosis not present

## 2021-06-09 DIAGNOSIS — G301 Alzheimer's disease with late onset: Secondary | ICD-10-CM | POA: Diagnosis not present

## 2021-06-09 DIAGNOSIS — F02B4 Dementia in other diseases classified elsewhere, moderate, with anxiety: Secondary | ICD-10-CM | POA: Diagnosis not present

## 2021-07-14 DIAGNOSIS — F02B4 Dementia in other diseases classified elsewhere, moderate, with anxiety: Secondary | ICD-10-CM | POA: Diagnosis not present

## 2021-07-14 DIAGNOSIS — I10 Essential (primary) hypertension: Secondary | ICD-10-CM | POA: Diagnosis not present

## 2021-07-14 DIAGNOSIS — F5101 Primary insomnia: Secondary | ICD-10-CM | POA: Diagnosis not present

## 2021-07-14 DIAGNOSIS — R451 Restlessness and agitation: Secondary | ICD-10-CM | POA: Diagnosis not present

## 2021-07-17 DIAGNOSIS — I739 Peripheral vascular disease, unspecified: Secondary | ICD-10-CM | POA: Diagnosis not present

## 2021-07-17 DIAGNOSIS — H40059 Ocular hypertension, unspecified eye: Secondary | ICD-10-CM | POA: Diagnosis not present

## 2021-07-25 DIAGNOSIS — F02B4 Dementia in other diseases classified elsewhere, moderate, with anxiety: Secondary | ICD-10-CM | POA: Diagnosis not present

## 2021-07-25 DIAGNOSIS — G301 Alzheimer's disease with late onset: Secondary | ICD-10-CM | POA: Diagnosis not present

## 2021-08-04 DIAGNOSIS — I10 Essential (primary) hypertension: Secondary | ICD-10-CM | POA: Diagnosis not present

## 2021-08-04 DIAGNOSIS — F5101 Primary insomnia: Secondary | ICD-10-CM | POA: Diagnosis not present

## 2021-08-04 DIAGNOSIS — K5901 Slow transit constipation: Secondary | ICD-10-CM | POA: Diagnosis not present

## 2021-08-05 DIAGNOSIS — I739 Peripheral vascular disease, unspecified: Secondary | ICD-10-CM | POA: Diagnosis not present

## 2021-08-05 DIAGNOSIS — H40059 Ocular hypertension, unspecified eye: Secondary | ICD-10-CM | POA: Diagnosis not present

## 2021-09-01 DIAGNOSIS — I10 Essential (primary) hypertension: Secondary | ICD-10-CM | POA: Diagnosis not present

## 2021-09-01 DIAGNOSIS — F339 Major depressive disorder, recurrent, unspecified: Secondary | ICD-10-CM | POA: Diagnosis not present

## 2021-09-01 DIAGNOSIS — F5101 Primary insomnia: Secondary | ICD-10-CM | POA: Diagnosis not present

## 2021-09-05 DIAGNOSIS — G301 Alzheimer's disease with late onset: Secondary | ICD-10-CM | POA: Diagnosis not present

## 2021-09-05 DIAGNOSIS — F331 Major depressive disorder, recurrent, moderate: Secondary | ICD-10-CM | POA: Diagnosis not present

## 2021-09-05 DIAGNOSIS — F02C3 Dementia in other diseases classified elsewhere, severe, with mood disturbance: Secondary | ICD-10-CM | POA: Diagnosis not present

## 2021-09-29 DIAGNOSIS — Z79899 Other long term (current) drug therapy: Secondary | ICD-10-CM | POA: Diagnosis not present

## 2021-09-29 DIAGNOSIS — I10 Essential (primary) hypertension: Secondary | ICD-10-CM | POA: Diagnosis not present

## 2021-09-29 DIAGNOSIS — F339 Major depressive disorder, recurrent, unspecified: Secondary | ICD-10-CM | POA: Diagnosis not present

## 2021-09-29 DIAGNOSIS — K5901 Slow transit constipation: Secondary | ICD-10-CM | POA: Diagnosis not present

## 2021-09-30 DIAGNOSIS — I739 Peripheral vascular disease, unspecified: Secondary | ICD-10-CM | POA: Diagnosis not present

## 2021-09-30 DIAGNOSIS — H40059 Ocular hypertension, unspecified eye: Secondary | ICD-10-CM | POA: Diagnosis not present

## 2021-10-03 DIAGNOSIS — F02C3 Dementia in other diseases classified elsewhere, severe, with mood disturbance: Secondary | ICD-10-CM | POA: Diagnosis not present

## 2021-10-03 DIAGNOSIS — G301 Alzheimer's disease with late onset: Secondary | ICD-10-CM | POA: Diagnosis not present

## 2021-10-03 DIAGNOSIS — F331 Major depressive disorder, recurrent, moderate: Secondary | ICD-10-CM | POA: Diagnosis not present

## 2021-10-22 DIAGNOSIS — I10 Essential (primary) hypertension: Secondary | ICD-10-CM | POA: Diagnosis not present

## 2021-10-22 DIAGNOSIS — G301 Alzheimer's disease with late onset: Secondary | ICD-10-CM | POA: Diagnosis not present

## 2021-10-22 DIAGNOSIS — H401131 Primary open-angle glaucoma, bilateral, mild stage: Secondary | ICD-10-CM | POA: Diagnosis not present

## 2021-10-22 DIAGNOSIS — K5901 Slow transit constipation: Secondary | ICD-10-CM | POA: Diagnosis not present

## 2021-10-27 DIAGNOSIS — F339 Major depressive disorder, recurrent, unspecified: Secondary | ICD-10-CM | POA: Diagnosis not present

## 2021-10-27 DIAGNOSIS — F02C3 Dementia in other diseases classified elsewhere, severe, with mood disturbance: Secondary | ICD-10-CM | POA: Diagnosis not present

## 2021-10-27 DIAGNOSIS — G301 Alzheimer's disease with late onset: Secondary | ICD-10-CM | POA: Diagnosis not present

## 2021-10-27 DIAGNOSIS — F5101 Primary insomnia: Secondary | ICD-10-CM | POA: Diagnosis not present

## 2021-10-30 DIAGNOSIS — H40059 Ocular hypertension, unspecified eye: Secondary | ICD-10-CM | POA: Diagnosis not present

## 2021-10-30 DIAGNOSIS — I739 Peripheral vascular disease, unspecified: Secondary | ICD-10-CM | POA: Diagnosis not present

## 2021-10-31 DIAGNOSIS — F02C3 Dementia in other diseases classified elsewhere, severe, with mood disturbance: Secondary | ICD-10-CM | POA: Diagnosis not present

## 2021-10-31 DIAGNOSIS — G301 Alzheimer's disease with late onset: Secondary | ICD-10-CM | POA: Diagnosis not present

## 2021-10-31 DIAGNOSIS — F5101 Primary insomnia: Secondary | ICD-10-CM | POA: Diagnosis not present

## 2021-10-31 DIAGNOSIS — F339 Major depressive disorder, recurrent, unspecified: Secondary | ICD-10-CM | POA: Diagnosis not present

## 2021-11-24 DIAGNOSIS — F02C3 Dementia in other diseases classified elsewhere, severe, with mood disturbance: Secondary | ICD-10-CM | POA: Diagnosis not present

## 2021-11-24 DIAGNOSIS — I10 Essential (primary) hypertension: Secondary | ICD-10-CM | POA: Diagnosis not present

## 2021-11-24 DIAGNOSIS — K5900 Constipation, unspecified: Secondary | ICD-10-CM | POA: Diagnosis not present

## 2021-11-24 DIAGNOSIS — G301 Alzheimer's disease with late onset: Secondary | ICD-10-CM | POA: Diagnosis not present

## 2021-12-12 DIAGNOSIS — F331 Major depressive disorder, recurrent, moderate: Secondary | ICD-10-CM | POA: Diagnosis not present

## 2021-12-12 DIAGNOSIS — F02C3 Dementia in other diseases classified elsewhere, severe, with mood disturbance: Secondary | ICD-10-CM | POA: Diagnosis not present

## 2021-12-12 DIAGNOSIS — G301 Alzheimer's disease with late onset: Secondary | ICD-10-CM | POA: Diagnosis not present

## 2021-12-18 DIAGNOSIS — H40059 Ocular hypertension, unspecified eye: Secondary | ICD-10-CM | POA: Diagnosis not present

## 2021-12-18 DIAGNOSIS — I739 Peripheral vascular disease, unspecified: Secondary | ICD-10-CM | POA: Diagnosis not present

## 2021-12-22 DIAGNOSIS — F03B3 Unspecified dementia, moderate, with mood disturbance: Secondary | ICD-10-CM | POA: Diagnosis not present

## 2021-12-22 DIAGNOSIS — F339 Major depressive disorder, recurrent, unspecified: Secondary | ICD-10-CM | POA: Diagnosis not present

## 2021-12-22 DIAGNOSIS — I10 Essential (primary) hypertension: Secondary | ICD-10-CM | POA: Diagnosis not present

## 2022-01-14 DIAGNOSIS — G301 Alzheimer's disease with late onset: Secondary | ICD-10-CM | POA: Diagnosis not present

## 2022-01-14 DIAGNOSIS — I10 Essential (primary) hypertension: Secondary | ICD-10-CM | POA: Diagnosis not present

## 2022-01-14 DIAGNOSIS — Z993 Dependence on wheelchair: Secondary | ICD-10-CM | POA: Diagnosis not present

## 2022-01-14 DIAGNOSIS — F02818 Dementia in other diseases classified elsewhere, unspecified severity, with other behavioral disturbance: Secondary | ICD-10-CM | POA: Diagnosis not present

## 2022-01-21 DIAGNOSIS — G301 Alzheimer's disease with late onset: Secondary | ICD-10-CM | POA: Diagnosis not present

## 2022-01-21 DIAGNOSIS — F331 Major depressive disorder, recurrent, moderate: Secondary | ICD-10-CM | POA: Diagnosis not present

## 2022-01-21 DIAGNOSIS — F02C3 Dementia in other diseases classified elsewhere, severe, with mood disturbance: Secondary | ICD-10-CM | POA: Diagnosis not present

## 2022-02-03 ENCOUNTER — Emergency Department (HOSPITAL_COMMUNITY): Payer: Medicare Other

## 2022-02-03 ENCOUNTER — Emergency Department (HOSPITAL_COMMUNITY)
Admission: EM | Admit: 2022-02-03 | Discharge: 2022-02-03 | Disposition: A | Discharge status: Skilled Nursing Facility | Payer: Medicare Other | Attending: Emergency Medicine | Admitting: Emergency Medicine

## 2022-02-03 DIAGNOSIS — Z79899 Other long term (current) drug therapy: Secondary | ICD-10-CM | POA: Insufficient documentation

## 2022-02-03 DIAGNOSIS — R4182 Altered mental status, unspecified: Secondary | ICD-10-CM | POA: Diagnosis not present

## 2022-02-03 DIAGNOSIS — R531 Weakness: Secondary | ICD-10-CM | POA: Insufficient documentation

## 2022-02-03 DIAGNOSIS — R410 Disorientation, unspecified: Secondary | ICD-10-CM | POA: Diagnosis not present

## 2022-02-03 DIAGNOSIS — I10 Essential (primary) hypertension: Secondary | ICD-10-CM | POA: Insufficient documentation

## 2022-02-03 DIAGNOSIS — Z1152 Encounter for screening for COVID-19: Secondary | ICD-10-CM | POA: Diagnosis not present

## 2022-02-03 DIAGNOSIS — N390 Urinary tract infection, site not specified: Secondary | ICD-10-CM | POA: Diagnosis not present

## 2022-02-03 DIAGNOSIS — Z85828 Personal history of other malignant neoplasm of skin: Secondary | ICD-10-CM | POA: Insufficient documentation

## 2022-02-03 DIAGNOSIS — Z7401 Bed confinement status: Secondary | ICD-10-CM | POA: Diagnosis not present

## 2022-02-03 DIAGNOSIS — R5383 Other fatigue: Secondary | ICD-10-CM | POA: Diagnosis not present

## 2022-02-03 DIAGNOSIS — F0390 Unspecified dementia without behavioral disturbance: Secondary | ICD-10-CM | POA: Insufficient documentation

## 2022-02-03 DIAGNOSIS — R404 Transient alteration of awareness: Secondary | ICD-10-CM | POA: Diagnosis not present

## 2022-02-03 LAB — COMPREHENSIVE METABOLIC PANEL
ALT: 11 U/L (ref 0–44)
AST: 15 U/L (ref 15–41)
Albumin: 3.3 g/dL — ABNORMAL LOW (ref 3.5–5.0)
Alkaline Phosphatase: 44 U/L (ref 38–126)
Anion gap: 12 (ref 5–15)
BUN: 29 mg/dL — ABNORMAL HIGH (ref 8–23)
CO2: 20 mmol/L — ABNORMAL LOW (ref 22–32)
Calcium: 8.1 mg/dL — ABNORMAL LOW (ref 8.9–10.3)
Chloride: 106 mmol/L (ref 98–111)
Creatinine, Ser: 0.85 mg/dL (ref 0.44–1.00)
GFR, Estimated: >60 mL/min (ref >60)
Glucose, Bld: 90 mg/dL (ref 70–99)
Potassium: 4 mmol/L (ref 3.5–5.1)
Sodium: 138 mmol/L (ref 135–145)
Total Bilirubin: 0.6 mg/dL (ref 0.3–1.2)
Total Protein: 5.5 g/dL — ABNORMAL LOW (ref 6.5–8.1)

## 2022-02-03 LAB — CBC WITH DIFFERENTIAL/PLATELET
Abs Immature Granulocytes: 0.02 10*3/uL (ref 0.00–0.07)
Basophils Absolute: 0 10*3/uL (ref 0.0–0.1)
Basophils Relative: 0 %
Eosinophils Absolute: 0 10*3/uL (ref 0.0–0.5)
Eosinophils Relative: 1 %
HCT: 33.5 % — ABNORMAL LOW (ref 36.0–46.0)
Hemoglobin: 10.4 g/dL — ABNORMAL LOW (ref 12.0–15.0)
Immature Granulocytes: 0 %
Lymphocytes Relative: 14 %
Lymphs Abs: 0.9 10*3/uL (ref 0.7–4.0)
MCH: 31.8 pg (ref 26.0–34.0)
MCHC: 31 g/dL (ref 30.0–36.0)
MCV: 102.4 fL — ABNORMAL HIGH (ref 80.0–100.0)
Monocytes Absolute: 0.8 10*3/uL (ref 0.1–1.0)
Monocytes Relative: 12 %
Neutro Abs: 4.8 10*3/uL (ref 1.7–7.7)
Neutrophils Relative %: 73 %
Platelets: 146 10*3/uL — ABNORMAL LOW (ref 150–400)
RBC: 3.27 MIL/uL — ABNORMAL LOW (ref 3.87–5.11)
RDW: 12.5 % (ref 11.5–15.5)
WBC: 6.5 10*3/uL (ref 4.0–10.5)
nRBC: 0 % (ref 0.0–0.2)

## 2022-02-03 LAB — URINALYSIS, ROUTINE W REFLEX MICROSCOPIC
Bilirubin Urine: NEGATIVE
Glucose, UA: NEGATIVE mg/dL
Hgb urine dipstick: NEGATIVE
Ketones, ur: NEGATIVE mg/dL
Nitrite: POSITIVE — AB
Protein, ur: NEGATIVE mg/dL
Specific Gravity, Urine: 1.015 (ref 1.005–1.030)
pH: 6 (ref 5.0–8.0)

## 2022-02-03 LAB — RESP PANEL BY RT-PCR (RSV, FLU A&B, COVID)  RVPGX2
Influenza A by PCR: NEGATIVE
Influenza B by PCR: NEGATIVE
Resp Syncytial Virus by PCR: NEGATIVE
SARS Coronavirus 2 by RT PCR: NEGATIVE

## 2022-02-03 LAB — LACTIC ACID, PLASMA: Lactic Acid, Venous: 1.1 mmol/L (ref 0.5–1.9)

## 2022-02-03 MED ORDER — SODIUM CHLORIDE 0.9 % IV BOLUS
500.0000 mL | Freq: Once | INTRAVENOUS | Status: AC
  Administered 2022-02-03: 500 mL via INTRAVENOUS

## 2022-02-03 MED ORDER — CEPHALEXIN 500 MG PO CAPS: 500.0000 mg | ORAL_CAPSULE | Freq: Two times a day (BID) | ORAL | 0 refills | Status: AC

## 2022-02-03 MED ORDER — SODIUM CHLORIDE 0.9 % IV SOLN
1.0000 g | Freq: Once | INTRAVENOUS | Status: AC
  Administered 2022-02-03: 1 g via INTRAVENOUS
  Filled 2022-02-03: qty 10

## 2022-02-03 NOTE — ED Notes (Signed)
Report called back to [place], Therapist, sports at [facility]. [facility]  has been called, awaiting transportation

## 2022-02-03 NOTE — ED Provider Notes (Signed)
[facility] EMERGENCY DEPARTMENT Provider Note   CSN: [id number] Arrival date & time: 02/03/22  1306     History  Chief Complaint  Patient presents with   Weakness    Vicki Garcia is a 87 y.o. female.  Patient is a 87 year old female who presents with altered mental status.  She has a history of dementia and arrives from [facility] memory care facility.  History is obtained by EMS.  They report that normally patient has dementia but is alert.  Today she has been sleepier than normal.  She will wake up and get a goal which is what her normal mental status is but then she does sleep frequently.  No known fevers.  No known vomiting or diarrhea.  History is limited due to patient's dementia.  On chart review, it appears that patient has a history of hypertension, hyperlipidemia and skin cancer.  EMS reports history of dementia.  They did note that her pupils were small and did try Narcan without any change in her status.       Home Medications Prior to Admission medications   Medication Sig Start Date End Date Taking? Authorizing Provider  citalopram (CELEXA) 10 MG tablet TAKE 1 TABLET ONCE DAILY. Patient taking differently: Take 10 mg by mouth daily.  05/01/18   Janith Lima, MD  donepezil (ARICEPT) 5 MG tablet Take 1 tablet (5 mg total) by mouth at bedtime. 02/28/18   Dennie Bible, NP  feeding supplement, ENSURE ENLIVE, (ENSURE ENLIVE) LIQD Take 237 mLs by mouth 2 (two) times daily between meals. 04/28/17   Aline August, MD  ferrous sulfate 325 (65 FE) MG tablet Take 1 tablet (325 mg total) by mouth 2 (two) times daily with a meal. Patient taking differently: Take 325 mg by mouth daily with breakfast.  05/05/17   Janith Lima, MD  latanoprost (XALATAN) 0.005 % ophthalmic solution Place 1 drop into both eyes at bedtime.  06/15/18   [provider]  LORazepam (ATIVAN) 0.5 MG tablet Take 0.5 mg by mouth 3 (three) times daily as needed. 11/02/18    [provider]  losartan-hydrochlorothiazide (HYZAAR) 100-12.5 MG tablet Take 1 tablet by mouth daily. 11/02/18   [provider]  Melatonin 3 MG TABS Take 3 mg by mouth at bedtime.    [provider]  OLANZapine (ZYPREXA) 5 MG tablet Take 5 mg by mouth at bedtime. 11/02/18   [provider]  QUEtiapine (SEROQUEL) 50 MG tablet  09/10/18   [provider]      Allergies    Codeine, Namzaric [memantine hcl-donepezil hcl], Norvasc [amlodipine besylate], and Vasotec    Review of Systems   Review of Systems  Unable to perform ROS: Dementia    Physical Exam Updated Vital Signs BP (!) 144/54   Pulse (!) 54   Temp 98.3 F (36.8 C) (Rectal)   Resp 15   SpO2 98%  Physical Exam Constitutional:      Appearance: She is well-developed.  HENT:     Head: Normocephalic and atraumatic.  Eyes:     Pupils: Pupils are equal, round, and reactive to light.     Comments: Pupils are small but equal bilaterally  Cardiovascular:     Rate and Rhythm: Normal rate and regular rhythm.     Heart sounds: Normal heart sounds.  Pulmonary:     Effort: Pulmonary effort is normal. No respiratory distress.     Breath sounds: Normal breath sounds. No wheezing or  rales.  Chest:     Chest wall: No tenderness.  Abdominal:     General: Bowel sounds are normal.     Palpations: Abdomen is soft.     Tenderness: There is no abdominal tenderness. There is no guarding or rebound.  Musculoskeletal:        General: Normal range of motion.     Cervical back: Normal range of motion and neck supple.  Lymphadenopathy:     Cervical: No cervical adenopathy.  Skin:    General: Skin is warm and dry.     Findings: No rash.  Neurological:     Mental Status: She is alert.     Comments: Patient is awake and alert, she seems to be moving all extremities symmetrically without obvious focal deficits     ED Results / Procedures / Treatments   Labs (all labs ordered are listed, but  only abnormal results are displayed) Labs Reviewed  CBC WITH DIFFERENTIAL/PLATELET - Abnormal; Notable for the following components:      Result Value   RBC 3.27 (*)    Hemoglobin 10.4 (*)    HCT 33.5 (*)    MCV 102.4 (*)    Platelets 146 (*)    All other components within normal limits  COMPREHENSIVE METABOLIC PANEL - Abnormal; Notable for the following components:   CO2 20 (*)    BUN 29 (*)    Calcium 8.1 (*)    Total Protein 5.5 (*)    Albumin 3.3 (*)    All other components within normal limits  RESP PANEL BY RT-PCR (RSV, FLU A&B, COVID)  RVPGX2  LACTIC ACID, PLASMA  URINALYSIS, ROUTINE W REFLEX MICROSCOPIC  CBG MONITORING, ED    EKG EKG Interpretation  Date/Time:  Tuesday February 03 2022 13:17:32 EST Ventricular Rate:  63 PR Interval:  151 QRS Duration: 90 QT Interval:  437 QTC Calculation: 448 R Axis:   5 Text Interpretation: Sinus rhythm Probable anteroseptal infarct, old Confirmed by Malvin Johns [phone]) on 02/03/2022 1:23:31 PM  Radiology CT Head Wo Contrast  Result Date: 02/03/2022 CLINICAL DATA:  Delirium EXAM: CT HEAD WITHOUT CONTRAST TECHNIQUE: Contiguous axial images were obtained from the base of the skull through the vertex without intravenous contrast. RADIATION DOSE REDUCTION: This exam was performed according to the departmental dose-optimization program which includes automated exposure control, adjustment of the mA and/or kV according to patient size and/or use of iterative reconstruction technique. COMPARISON:  CT head dated April 26, 2017 FINDINGS: Brain: No evidence of acute infarction, hemorrhage, hydrocephalus, extra-axial collection or mass lesion/mass effect. Prominence of the ventricles and sulci secondary to advanced cerebral atrophy patchy area of low-attenuation of the periventricular white matter presumed chronic microvascular ischemic changes. Right parietal mass, likely meningioma, unchanged. Vascular: No hyperdense vessel or unexpected  calcification. Skull: Normal. Negative for fracture or focal lesion. Sinuses/Orbits: No acute finding. Other: None. IMPRESSION: 1. No acute intracranial abnormality. 2. Advanced cerebral atrophy and chronic microvascular ischemic changes of the white matter. 3. Right parietal mass, likely meningioma, unchanged. Electronically Signed   By: Keane Police D.O.   On: 02/03/2022 15:00   DG Chest 2 View  Result Date: 02/03/2022 CLINICAL DATA:  Weakness EXAM: CHEST - 2 VIEW COMPARISON:  X-ray 08/06/2018 FINDINGS: Overlapping cardiac leads. Normal cardiopericardial silhouette. Tortuous and ectatic aorta. No consolidation, pneumothorax or effusion. Degenerative changes are seen of the spine on the lateral view. IMPRESSION: No acute cardiopulmonary disease. Electronically Signed   By: Larose Hires.D.  On: 02/03/2022 14:02    Procedures Procedures    Medications Ordered in ED Medications - No data to display  ED Course/ Medical Decision Making/ A&P                             Medical Decision Making Amount and/or Complexity of Data Reviewed Labs: ordered. Radiology: ordered.   Patient is a 87 year old female who has a history of dementia and presents from the nursing facility with increased weakness.  She is awake and interactive.  No focal deficits are appreciated.  She had a head CT which does not show any acute abnormalities.  Chest x-ray was interpreted by me and confirmed by the radiologist to show no evidence of pneumonia.  No other acute abnormality.  Labs so far are nonconcerning.  She is waiting urinalysis and respiratory panel.  She was given some IV fluids.  If further studies are negative, can likely be discharged back to the nursing facility. Care turned over to Dr. Langston Masker pending u/a and resp panel.  Final Clinical Impression(s) / ED Diagnoses Final diagnoses:  Weakness    Rx / DC Orders ED Discharge Orders     None         Malvin Johns, MD 02/03/22 1521

## 2022-02-03 NOTE — ED Notes (Signed)
Purwick placed for urine sample

## 2022-02-03 NOTE — ED Notes (Signed)
Patient being transported to x-ray.

## 2022-02-03 NOTE — ED Notes (Signed)
Daughter called at this time and given update and notified patient will be going back to facility.

## 2022-02-03 NOTE — ED Notes (Signed)
Got patient into a gown on the monitor did EKG patient is resting with call bell in reach

## 2022-02-03 NOTE — ED Triage Notes (Signed)
Patient bib [facility] from [facility] with complaints of weakness. She is in the memory care unit. Per ems facility states patient is usually really alert and ambulates with no assistance. LKW was 700 last night. Since 0600 this morning she is very lethargic, hasn't walked and keeps falling asleep.

## 2022-02-03 NOTE — Discharge Instructions (Addendum)
Vicki Garcia was diagnosed with a urinary tract infection today.  The rest of her workup including her blood test CT scan of her brain did not show any emergency medical conditions.  She was given an IV antibiotic in the ER, and will need to continue on antibiotics for 5 more days at home.  The paper prescription was provided.

## 2022-02-03 NOTE — ED Notes (Signed)
Called lab about urine that was sent down they are processing it now.

## 2022-02-03 NOTE — ED Provider Notes (Signed)
87 yo female here w/ dementia here for generalized weakness Labs unremarkable - pending UA and covid/flu swab  Physical Exam  BP (!) 158/72   Pulse 84   Temp 98.1 F (36.7 C) (Oral)   Resp 18   SpO2 98%   Physical Exam  Procedures  Procedures  ED Course / MDM    Medical Decision Making Amount and/or Complexity of Data Reviewed Labs: ordered. Radiology: ordered.  Risk Prescription drug management.   UA consistent with UTI.  Rocephin ordered, will discharge on keflex.       Wyvonnia Dusky, MD 02/03/22 2231

## 2022-02-03 NOTE — ED Notes (Signed)
Patient is trying to climb out of bed and pull off wires.

## 2022-02-06 LAB — URINE CULTURE: Culture: >=100000 — AB

## 2022-02-07 ENCOUNTER — Telehealth (HOSPITAL_BASED_OUTPATIENT_CLINIC_OR_DEPARTMENT_OTHER): Payer: Self-pay | Admitting: *Deleted

## 2022-02-07 NOTE — Telephone Encounter (Signed)
Post ED Visit - Positive Culture Follow-up  Culture report reviewed by antimicrobial stewardship pharmacist: [facility] '[]'$  Elenor Quinones, Pharm.D. '[]'$  Heide Guile, Pharm.D., BCPS AQ-ID '[]'$  Parks Neptune, Pharm.D., BCPS '[]'$  Alycia Rossetti, Pharm.D., BCPS '[]'$  [place], Pharm.D., BCPS, AAHIVP '[]'$  Legrand Como, Pharm.D., BCPS, AAHIVP '[]'$  Salome Arnt, PharmD, BCPS '[]'$  Johnnette Gourd, PharmD, BCPS '[]'$  Hughes Better, PharmD, BCPS '[]'$  Leeroy Cha, PharmD '[]'$  Laqueta Linden, PharmD, BCPS '[x]'$  Franchot Gallo, PharmD  [place] [facility] '[]'$  Leodis Sias, PharmD '[]'$  Lindell Spar, PharmD '[]'$  Royetta Asal, PharmD '[]'$  Graylin Shiver, Rph '[]'$  Rema Fendt) Glennon Mac, PharmD '[]'$  Arlyn Dunning, PharmD '[]'$  Netta Cedars, PharmD '[]'$  Dia Sitter, PharmD '[]'$  Leone Haven, PharmD '[]'$  Gretta Arab, PharmD '[]'$  Theodis Shove, PharmD '[]'$  Peggyann Juba, PharmD '[]'$  Reuel Boom, PharmD   Positive urine culture Treated with Cephalexin, organism sensitive to the same and no further patient follow-up is required at this time.  Rosie Fate 02/07/2022, 1:15 PM

## 2022-02-09 DIAGNOSIS — F02C3 Dementia in other diseases classified elsewhere, severe, with mood disturbance: Secondary | ICD-10-CM | POA: Diagnosis not present

## 2022-02-09 DIAGNOSIS — N390 Urinary tract infection, site not specified: Secondary | ICD-10-CM | POA: Diagnosis not present

## 2022-02-09 DIAGNOSIS — I10 Essential (primary) hypertension: Secondary | ICD-10-CM | POA: Diagnosis not present

## 2022-02-16 DIAGNOSIS — I10 Essential (primary) hypertension: Secondary | ICD-10-CM | POA: Diagnosis not present

## 2022-02-16 DIAGNOSIS — F0393 Unspecified dementia, unspecified severity, with mood disturbance: Secondary | ICD-10-CM | POA: Diagnosis not present

## 2022-02-16 DIAGNOSIS — K5900 Constipation, unspecified: Secondary | ICD-10-CM | POA: Diagnosis not present

## 2022-02-18 DIAGNOSIS — I739 Peripheral vascular disease, unspecified: Secondary | ICD-10-CM | POA: Diagnosis not present

## 2022-02-18 DIAGNOSIS — H40059 Ocular hypertension, unspecified eye: Secondary | ICD-10-CM | POA: Diagnosis not present

## 2022-02-20 DIAGNOSIS — G301 Alzheimer's disease with late onset: Secondary | ICD-10-CM | POA: Diagnosis not present

## 2022-02-20 DIAGNOSIS — F331 Major depressive disorder, recurrent, moderate: Secondary | ICD-10-CM | POA: Diagnosis not present

## 2022-02-20 DIAGNOSIS — F02C3 Dementia in other diseases classified elsewhere, severe, with mood disturbance: Secondary | ICD-10-CM | POA: Diagnosis not present

## 2022-03-16 DIAGNOSIS — H40059 Ocular hypertension, unspecified eye: Secondary | ICD-10-CM | POA: Diagnosis not present

## 2022-03-16 DIAGNOSIS — I739 Peripheral vascular disease, unspecified: Secondary | ICD-10-CM | POA: Diagnosis not present

## 2022-03-16 DIAGNOSIS — F5101 Primary insomnia: Secondary | ICD-10-CM | POA: Diagnosis not present

## 2022-03-16 DIAGNOSIS — I10 Essential (primary) hypertension: Secondary | ICD-10-CM | POA: Diagnosis not present

## 2022-03-16 DIAGNOSIS — F02818 Dementia in other diseases classified elsewhere, unspecified severity, with other behavioral disturbance: Secondary | ICD-10-CM | POA: Diagnosis not present

## 2022-04-06 DIAGNOSIS — K5900 Constipation, unspecified: Secondary | ICD-10-CM | POA: Diagnosis not present

## 2022-04-06 DIAGNOSIS — I10 Essential (primary) hypertension: Secondary | ICD-10-CM | POA: Diagnosis not present

## 2022-04-06 DIAGNOSIS — F5101 Primary insomnia: Secondary | ICD-10-CM | POA: Diagnosis not present

## 2022-04-08 DIAGNOSIS — I10 Essential (primary) hypertension: Secondary | ICD-10-CM | POA: Diagnosis not present

## 2022-04-08 DIAGNOSIS — K5901 Slow transit constipation: Secondary | ICD-10-CM | POA: Diagnosis not present

## 2022-04-08 DIAGNOSIS — G301 Alzheimer's disease with late onset: Secondary | ICD-10-CM | POA: Diagnosis not present

## 2022-04-08 DIAGNOSIS — G4700 Insomnia, unspecified: Secondary | ICD-10-CM | POA: Diagnosis not present

## 2022-04-14 DIAGNOSIS — H40059 Ocular hypertension, unspecified eye: Secondary | ICD-10-CM | POA: Diagnosis not present

## 2022-04-14 DIAGNOSIS — I739 Peripheral vascular disease, unspecified: Secondary | ICD-10-CM | POA: Diagnosis not present

## 2022-04-16 DIAGNOSIS — F331 Major depressive disorder, recurrent, moderate: Secondary | ICD-10-CM | POA: Diagnosis not present

## 2022-04-16 DIAGNOSIS — F02C3 Dementia in other diseases classified elsewhere, severe, with mood disturbance: Secondary | ICD-10-CM | POA: Diagnosis not present

## 2022-04-16 DIAGNOSIS — G301 Alzheimer's disease with late onset: Secondary | ICD-10-CM | POA: Diagnosis not present

## 2022-05-04 DIAGNOSIS — G4700 Insomnia, unspecified: Secondary | ICD-10-CM | POA: Diagnosis not present

## 2022-05-04 DIAGNOSIS — F331 Major depressive disorder, recurrent, moderate: Secondary | ICD-10-CM | POA: Diagnosis not present

## 2022-05-04 DIAGNOSIS — I10 Essential (primary) hypertension: Secondary | ICD-10-CM | POA: Diagnosis not present

## 2022-05-14 DIAGNOSIS — H01001 Unspecified blepharitis right upper eyelid: Secondary | ICD-10-CM | POA: Diagnosis not present

## 2022-05-14 DIAGNOSIS — H2512 Age-related nuclear cataract, left eye: Secondary | ICD-10-CM | POA: Diagnosis not present

## 2022-05-14 DIAGNOSIS — H01004 Unspecified blepharitis left upper eyelid: Secondary | ICD-10-CM | POA: Diagnosis not present

## 2022-05-15 DIAGNOSIS — F411 Generalized anxiety disorder: Secondary | ICD-10-CM | POA: Diagnosis not present

## 2022-05-15 DIAGNOSIS — I739 Peripheral vascular disease, unspecified: Secondary | ICD-10-CM | POA: Diagnosis not present

## 2022-06-01 DIAGNOSIS — F331 Major depressive disorder, recurrent, moderate: Secondary | ICD-10-CM | POA: Diagnosis not present

## 2022-06-01 DIAGNOSIS — I10 Essential (primary) hypertension: Secondary | ICD-10-CM | POA: Diagnosis not present

## 2022-06-01 DIAGNOSIS — F02818 Dementia in other diseases classified elsewhere, unspecified severity, with other behavioral disturbance: Secondary | ICD-10-CM | POA: Diagnosis not present

## 2022-06-16 DIAGNOSIS — F411 Generalized anxiety disorder: Secondary | ICD-10-CM | POA: Diagnosis not present

## 2022-06-16 DIAGNOSIS — I739 Peripheral vascular disease, unspecified: Secondary | ICD-10-CM | POA: Diagnosis not present

## 2022-07-17 DIAGNOSIS — G309 Alzheimer's disease, unspecified: Secondary | ICD-10-CM | POA: Diagnosis not present

## 2022-07-17 DIAGNOSIS — H409 Unspecified glaucoma: Secondary | ICD-10-CM | POA: Diagnosis not present

## 2022-07-20 DIAGNOSIS — G301 Alzheimer's disease with late onset: Secondary | ICD-10-CM | POA: Diagnosis not present

## 2022-07-20 DIAGNOSIS — F331 Major depressive disorder, recurrent, moderate: Secondary | ICD-10-CM | POA: Diagnosis not present

## 2022-07-20 DIAGNOSIS — H409 Unspecified glaucoma: Secondary | ICD-10-CM | POA: Diagnosis not present

## 2022-07-20 DIAGNOSIS — F02818 Dementia in other diseases classified elsewhere, unspecified severity, with other behavioral disturbance: Secondary | ICD-10-CM | POA: Diagnosis not present

## 2022-08-12 DIAGNOSIS — K5901 Slow transit constipation: Secondary | ICD-10-CM | POA: Diagnosis not present

## 2022-08-12 DIAGNOSIS — G301 Alzheimer's disease with late onset: Secondary | ICD-10-CM | POA: Diagnosis not present

## 2022-08-12 DIAGNOSIS — G4709 Other insomnia: Secondary | ICD-10-CM | POA: Diagnosis not present

## 2022-08-12 DIAGNOSIS — I10 Essential (primary) hypertension: Secondary | ICD-10-CM | POA: Diagnosis not present

## 2022-08-17 DIAGNOSIS — F331 Major depressive disorder, recurrent, moderate: Secondary | ICD-10-CM | POA: Diagnosis not present

## 2022-08-17 DIAGNOSIS — I10 Essential (primary) hypertension: Secondary | ICD-10-CM | POA: Diagnosis not present

## 2022-09-07 DIAGNOSIS — Z79899 Other long term (current) drug therapy: Secondary | ICD-10-CM | POA: Diagnosis not present

## 2022-09-07 DIAGNOSIS — F5101 Primary insomnia: Secondary | ICD-10-CM | POA: Diagnosis not present

## 2022-09-07 DIAGNOSIS — I10 Essential (primary) hypertension: Secondary | ICD-10-CM | POA: Diagnosis not present

## 2022-09-07 DIAGNOSIS — F331 Major depressive disorder, recurrent, moderate: Secondary | ICD-10-CM | POA: Diagnosis not present

## 2022-09-15 DIAGNOSIS — I10 Essential (primary) hypertension: Secondary | ICD-10-CM | POA: Diagnosis not present

## 2022-09-15 DIAGNOSIS — F331 Major depressive disorder, recurrent, moderate: Secondary | ICD-10-CM | POA: Diagnosis not present

## 2022-10-02 DIAGNOSIS — F5101 Primary insomnia: Secondary | ICD-10-CM | POA: Diagnosis not present

## 2022-10-02 DIAGNOSIS — G309 Alzheimer's disease, unspecified: Secondary | ICD-10-CM | POA: Diagnosis not present

## 2022-10-02 DIAGNOSIS — F331 Major depressive disorder, recurrent, moderate: Secondary | ICD-10-CM | POA: Diagnosis not present

## 2022-10-02 DIAGNOSIS — F411 Generalized anxiety disorder: Secondary | ICD-10-CM | POA: Diagnosis not present

## 2022-10-05 DIAGNOSIS — F02C3 Dementia in other diseases classified elsewhere, severe, with mood disturbance: Secondary | ICD-10-CM | POA: Diagnosis not present

## 2022-10-05 DIAGNOSIS — F331 Major depressive disorder, recurrent, moderate: Secondary | ICD-10-CM | POA: Diagnosis not present

## 2022-10-05 DIAGNOSIS — H1031 Unspecified acute conjunctivitis, right eye: Secondary | ICD-10-CM | POA: Diagnosis not present

## 2022-10-05 DIAGNOSIS — G301 Alzheimer's disease with late onset: Secondary | ICD-10-CM | POA: Diagnosis not present

## 2022-10-08 ENCOUNTER — Emergency Department (HOSPITAL_COMMUNITY)
Admission: EM | Admit: 2022-10-08 | Discharge: 2022-10-08 | Disposition: A | Discharge status: Home / Self Care | Payer: Medicare Other | Attending: Emergency Medicine | Admitting: Emergency Medicine

## 2022-10-08 ENCOUNTER — Emergency Department (HOSPITAL_COMMUNITY): Payer: Medicare Other

## 2022-10-08 ENCOUNTER — Other Ambulatory Visit: Payer: Self-pay

## 2022-10-08 ENCOUNTER — Encounter (HOSPITAL_COMMUNITY): Payer: Self-pay

## 2022-10-08 DIAGNOSIS — R0689 Other abnormalities of breathing: Secondary | ICD-10-CM | POA: Diagnosis not present

## 2022-10-08 DIAGNOSIS — F0390 Unspecified dementia without behavioral disturbance: Secondary | ICD-10-CM | POA: Insufficient documentation

## 2022-10-08 DIAGNOSIS — H44001 Unspecified purulent endophthalmitis, right eye: Secondary | ICD-10-CM | POA: Diagnosis not present

## 2022-10-08 DIAGNOSIS — F03C18 Unspecified dementia, severe, with other behavioral disturbance: Secondary | ICD-10-CM | POA: Diagnosis not present

## 2022-10-08 DIAGNOSIS — N390 Urinary tract infection, site not specified: Secondary | ICD-10-CM | POA: Insufficient documentation

## 2022-10-08 DIAGNOSIS — I959 Hypotension, unspecified: Secondary | ICD-10-CM | POA: Diagnosis not present

## 2022-10-08 DIAGNOSIS — G9340 Encephalopathy, unspecified: Secondary | ICD-10-CM | POA: Diagnosis not present

## 2022-10-08 DIAGNOSIS — I10 Essential (primary) hypertension: Secondary | ICD-10-CM | POA: Diagnosis not present

## 2022-10-08 DIAGNOSIS — I6523 Occlusion and stenosis of bilateral carotid arteries: Secondary | ICD-10-CM | POA: Diagnosis not present

## 2022-10-08 DIAGNOSIS — G9341 Metabolic encephalopathy: Secondary | ICD-10-CM | POA: Insufficient documentation

## 2022-10-08 DIAGNOSIS — R55 Syncope and collapse: Secondary | ICD-10-CM | POA: Diagnosis not present

## 2022-10-08 DIAGNOSIS — D320 Benign neoplasm of cerebral meninges: Secondary | ICD-10-CM | POA: Diagnosis not present

## 2022-10-08 DIAGNOSIS — S0591XA Unspecified injury of right eye and orbit, initial encounter: Secondary | ICD-10-CM | POA: Diagnosis not present

## 2022-10-08 DIAGNOSIS — R220 Localized swelling, mass and lump, head: Secondary | ICD-10-CM | POA: Diagnosis not present

## 2022-10-08 DIAGNOSIS — R4182 Altered mental status, unspecified: Secondary | ICD-10-CM | POA: Diagnosis not present

## 2022-10-08 DIAGNOSIS — Z79899 Other long term (current) drug therapy: Secondary | ICD-10-CM | POA: Diagnosis not present

## 2022-10-08 DIAGNOSIS — R5383 Other fatigue: Secondary | ICD-10-CM | POA: Diagnosis not present

## 2022-10-08 DIAGNOSIS — R0989 Other specified symptoms and signs involving the circulatory and respiratory systems: Secondary | ICD-10-CM | POA: Diagnosis not present

## 2022-10-08 DIAGNOSIS — R404 Transient alteration of awareness: Secondary | ICD-10-CM | POA: Diagnosis not present

## 2022-10-08 HISTORY — DX: Unspecified dementia, unspecified severity, with other behavioral disturbance: F03.918

## 2022-10-08 LAB — CBC WITH DIFFERENTIAL/PLATELET
Abs Immature Granulocytes: 0.06 10*3/uL (ref 0.00–0.07)
Basophils Absolute: 0 10*3/uL (ref 0.0–0.1)
Basophils Relative: 0 %
Eosinophils Absolute: 0.1 10*3/uL (ref 0.0–0.5)
Eosinophils Relative: 1 %
HCT: 39 % (ref 36.0–46.0)
Hemoglobin: 12.4 g/dL (ref 12.0–15.0)
Immature Granulocytes: 1 %
Lymphocytes Relative: 21 %
Lymphs Abs: 1.7 10*3/uL (ref 0.7–4.0)
MCH: 30.5 pg (ref 26.0–34.0)
MCHC: 31.8 g/dL (ref 30.0–36.0)
MCV: 95.8 fL (ref 80.0–100.0)
Monocytes Absolute: 0.9 10*3/uL (ref 0.1–1.0)
Monocytes Relative: 12 %
Neutro Abs: 5 10*3/uL (ref 1.7–7.7)
Neutrophils Relative %: 65 %
Platelets: 270 10*3/uL (ref 150–400)
RBC: 4.07 MIL/uL (ref 3.87–5.11)
RDW: 12.8 % (ref 11.5–15.5)
WBC: 7.8 10*3/uL (ref 4.0–10.5)
nRBC: 0 % (ref 0.0–0.2)

## 2022-10-08 LAB — AMMONIA: Ammonia: 17 umol/L (ref 9–35)

## 2022-10-08 LAB — URINALYSIS, W/ REFLEX TO CULTURE (INFECTION SUSPECTED)
Bilirubin Urine: NEGATIVE
Glucose, UA: NEGATIVE mg/dL
Ketones, ur: NEGATIVE mg/dL
Nitrite: POSITIVE — AB
Protein, ur: NEGATIVE mg/dL
Specific Gravity, Urine: 1.012 (ref 1.005–1.030)
WBC, UA: >50 WBC/hpf (ref 0–5)
pH: 6 (ref 5.0–8.0)

## 2022-10-08 LAB — COMPREHENSIVE METABOLIC PANEL
ALT: 19 U/L (ref 0–44)
AST: 24 U/L (ref 15–41)
Albumin: 3.9 g/dL (ref 3.5–5.0)
Alkaline Phosphatase: 56 U/L (ref 38–126)
Anion gap: 11 (ref 5–15)
BUN: 22 mg/dL (ref 8–23)
CO2: 27 mmol/L (ref 22–32)
Calcium: 8.9 mg/dL (ref 8.9–10.3)
Chloride: 94 mmol/L — ABNORMAL LOW (ref 98–111)
Creatinine, Ser: 0.69 mg/dL (ref 0.44–1.00)
GFR, Estimated: >60 mL/min (ref >60)
Glucose, Bld: 96 mg/dL (ref 70–99)
Potassium: 3.6 mmol/L (ref 3.5–5.1)
Sodium: 132 mmol/L — ABNORMAL LOW (ref 135–145)
Total Bilirubin: 0.6 mg/dL (ref 0.3–1.2)
Total Protein: 7.3 g/dL (ref 6.5–8.1)

## 2022-10-08 LAB — VALPROIC ACID LEVEL: Valproic Acid Lvl: <10 ug/mL — ABNORMAL LOW (ref 50.0–100.0)

## 2022-10-08 LAB — I-STAT CG4 LACTIC ACID, ED: Lactic Acid, Venous: 0.9 mmol/L (ref 0.5–1.9)

## 2022-10-08 MED ORDER — FLUORESCEIN SODIUM 1 MG OP STRP
1.0000 | ORAL_STRIP | Freq: Once | OPHTHALMIC | Status: AC
  Administered 2022-10-08: 1 via OPHTHALMIC
  Filled 2022-10-08: qty 1

## 2022-10-08 MED ORDER — PIPERACILLIN-TAZOBACTAM 3.375 G IVPB 30 MIN
3.3750 g | Freq: Once | INTRAVENOUS | Status: AC
  Administered 2022-10-08: 3.375 g via INTRAVENOUS
  Filled 2022-10-08: qty 50

## 2022-10-08 MED ORDER — SODIUM CHLORIDE 0.9 % IV SOLN: 2.0000 g | Freq: Once | INTRAVENOUS | Status: DC

## 2022-10-08 MED ORDER — TETRACAINE HCL 0.5 % OP SOLN
2.0000 [drp] | Freq: Once | OPHTHALMIC | Status: AC
  Administered 2022-10-08: 2 [drp] via OPHTHALMIC
  Filled 2022-10-08: qty 4

## 2022-10-08 MED ORDER — VANCOMYCIN HCL IN DEXTROSE 1-5 GM/200ML-% IV SOLN
1000.0000 mg | Freq: Once | INTRAVENOUS | Status: AC
  Administered 2022-10-08: 1000 mg via INTRAVENOUS
  Filled 2022-10-08: qty 200

## 2022-10-08 NOTE — Progress Notes (Signed)
A consult was received from an ED physician for vancomycin per pharmacy dosing.  The patient's profile has been reviewed for ht/wt/allergies/indication/available labs.   A one time order has been placed for vancomycin 1 gm.    Further antibiotics/pharmacy consults should be ordered by admitting physician if indicated.                       Thank you,   Herby Abraham, Pharm.D Use secure chat for questions 10/08/2022 3:53 PM

## 2022-10-08 NOTE — ED Notes (Signed)
Pt poa, Paul, notified of transfer to [facility]

## 2022-10-08 NOTE — ED Notes (Signed)
Patient transported to CT 

## 2022-10-08 NOTE — ED Triage Notes (Signed)
BIBA from [place] [facility] at [facility] for fatigue, decreased responsiveness. Last normal was yesterday afternoon. Baseline ambulatory with assistance, dementia/non verbal. 150 cc NS given PTA 118/70 BP 52 HR 99% room air 20 RR 111 cbg

## 2022-10-08 NOTE — ED Notes (Signed)
[facility] called for transport.

## 2022-10-08 NOTE — ED Provider Notes (Signed)
4:35 PM Care assumed from Dr. Rubin Payor.  At time of transfer of care, patient is awaiting for ophthalmology to call back to get recommendations about management for this patient who has altered mental status with evidence of urinary tract infection as well as some abnormality.  Plan of care will be to admit after discussing with ophthalmology to make sure appropriate location and plan.  4:52 PM Spoke to Dr. Zenaida Niece with ophthalmology.  She reviewed the images and workup and is concerned about acute corneal perforation that will need repair within the next 24 hours.  She says she is not equipped to do that here tonight so the patient will need to go to either [facility] or [facility] to see ophthalmology tonight.  Patient is already received the broad-spectrum antibiotics for the UTI and possible eye infection as well.  Dr. Zenaida Niece says that the patient is unable to admitted in the [facility] system.  Will try to speak to ophthalmology at [facility] to get acceptance for admission and transfer.  Spoke to [facility] ophthalmology team who requested to see some images and will look at the CT scans and discussed amongst himself.  They will call me back to let me know if they will accept or if the patient needs to go to [facility] if enucleation might be needed as they do not have oculoplastics at [facility] currently.  7:01 PM Just spoke to the [facility] transfer line who says that Dr. Marcelle Smiling with ophthalmology does accept the patient for ED to ED transfer.  They said they do not have an open bed now but will call back as soon as possible so she can be transferred tonight ideally.  Transfer team agrees that this is a patient that needs priority for potential time sensitive surgical management.  Will await patient to be transferred.  Patient is now ready for transfer.  She will be transferred to [facility] to continue her care.   Clinical Impression: 1. Urinary tract infection without hematuria, site unspecified    2. Metabolic encephalopathy     Disposition: ED to ED transfer to get seen by ophthalmology and admitted for altered mental status, urinary tract infection, and concern for corneal perforation and infection.  This note was prepared with assistance of Conservation officer, historic buildings. Occasional wrong-word or sound-a-like substitutions may have occurred due to the inherent limitations of voice recognition software.       Ardelle Haliburton, Canary Brim, MD 10/08/22 2043

## 2022-10-08 NOTE — ED Provider Notes (Signed)
[place] [facility] AT [facility] Provider Note   CSN: [id number] Arrival date & time: 10/08/22  1154     History  Chief Complaint  Patient presents with   Fatigue    Vicki Garcia is a 87 y.o. female.  HPI Patient reportedly brought in for mental status change/decreased status.  Patient cannot provide any history.  However per EMS report patient is baseline ambulatory with assistance but has dementia and is nonverbal.  Patient laying on her side cannot answer any questions.  Nonverbal for me.  Does have some blood coming out of her right eye.  Unknown chronicity.  Per nursing patient reportedly had had drops she takes for her eye.  Reviewing paperwork from nursing home reportedly started on Keflex today, but unknown why it was prescribed.  I have attempted to contact the patient's husband, who is power of attorney, the patient's daughter who is his second emergency contact.  I was unable to get through to either.  Also attempted to discuss with Dr. Charlotte Sanes, who is the patient's ophthalmologist but have been unable to get through to her either.    Home Medications Prior to Admission medications   Medication Sig Start Date End Date Taking? Authorizing Provider  citalopram (CELEXA) 10 MG tablet TAKE 1 TABLET ONCE DAILY. Patient taking differently: Take 10 mg by mouth daily.  05/01/18   Etta Grandchild, MD  donepezil (ARICEPT) 5 MG tablet Take 1 tablet (5 mg total) by mouth at bedtime. 02/28/18   Nilda Riggs, NP  feeding supplement, ENSURE ENLIVE, (ENSURE ENLIVE) LIQD Take 237 mLs by mouth 2 (two) times daily between meals. 04/28/17   Glade Lloyd, MD  ferrous sulfate 325 (65 FE) MG tablet Take 1 tablet (325 mg total) by mouth 2 (two) times daily with a meal. Patient taking differently: Take 325 mg by mouth daily with breakfast.  05/05/17   Etta Grandchild, MD  latanoprost (XALATAN) 0.005 % ophthalmic solution Place 1 drop into both eyes at bedtime.  06/15/18    [provider]  LORazepam (ATIVAN) 0.5 MG tablet Take 0.5 mg by mouth 3 (three) times daily as needed. 11/02/18   [provider]  losartan-hydrochlorothiazide (HYZAAR) 100-12.5 MG tablet Take 1 tablet by mouth daily. 11/02/18   [provider]  Melatonin 3 MG TABS Take 3 mg by mouth at bedtime.    [provider]  OLANZapine (ZYPREXA) 5 MG tablet Take 5 mg by mouth at bedtime. 11/02/18   [provider]  QUEtiapine (SEROQUEL) 50 MG tablet  09/10/18   [provider]      Allergies    Codeine, Namzaric [memantine hcl-donepezil hcl], Norvasc [amlodipine besylate], and Vasotec    Review of Systems   Review of Systems  Physical Exam Updated Vital Signs BP (!) 157/60   Pulse 61   Temp 98.2 F (36.8 C) (Rectal)   Resp 17   SpO2 100%  Physical Exam Vitals and nursing note reviewed.  HENT:     Head: Normocephalic.  Eyes:     Comments:  some edema right globe.  Does have irregularity on inferior aspect of cornea.  Small amount of blood.  Chest:     Chest wall: No tenderness.  Abdominal:     Tenderness: There is no abdominal tenderness.  Musculoskeletal:     Cervical back: Neck supple.     Right lower leg: No edema.     Left lower leg: No edema.  Skin:  General: Skin is warm.  Neurological:     Comments: Nonverbal.  Will not follow commands but does open eyes, particular left eye.          ED Results / Procedures / Treatments   Labs (all labs ordered are listed, but only abnormal results are displayed) Labs Reviewed  COMPREHENSIVE METABOLIC PANEL - Abnormal; Notable for the following components:      Result Value   Sodium 132 (*)    Chloride 94 (*)    All other components within normal limits  URINALYSIS, W/ REFLEX TO CULTURE (INFECTION SUSPECTED) - Abnormal; Notable for the following components:   APPearance HAZY (*)    Hgb urine dipstick SMALL (*)    Nitrite POSITIVE (*)    Leukocytes,Ua MODERATE (*)     Bacteria, UA RARE (*)    All other components within normal limits  VALPROIC ACID LEVEL - Abnormal; Notable for the following components:   Valproic Acid Lvl <10 (*)    All other components within normal limits  URINE CULTURE  CULTURE, BLOOD (ROUTINE X 2)  CULTURE, BLOOD (ROUTINE X 2)  CBC WITH DIFFERENTIAL/PLATELET  AMMONIA  I-STAT CG4 LACTIC ACID, ED    EKG None  Radiology CT Orbits Wo Contrast  Result Date: 10/08/2022 CLINICAL DATA:  Retinal disorder.  Right eye injury. EXAM: CT ORBITS WITHOUT CONTRAST TECHNIQUE: Multidetector CT imaging of the orbits was performed using the standard protocol without intravenous contrast. Multiplanar CT image reconstructions were also generated. RADIATION DOSE REDUCTION: This exam was performed according to the departmental dose-optimization program which includes automated exposure control, adjustment of the mA and/or kV according to patient size and/or use of iterative reconstruction technique. COMPARISON:  None Available. FINDINGS: Orbits: Prior right cataract extraction. No fat stranding or hemorrhage within the right orbit to indicate acute globe injury. Normal left orbit. Visible paranasal sinuses: Clear. Soft tissues: Mild swelling of the right preseptal soft tissues. Osseous: No acute fracture or mandibular dislocation. Mild degenerative changes of the right temporomandibular joint. Limited intracranial: No acute or significant finding. IMPRESSION: Mild swelling of the right preseptal soft tissues. No evidence of acute globe injury. Electronically Signed   By: Orvan Falconer M.D.   On: 10/08/2022 16:06   CT HEAD WO CONTRAST ( )  Result Date: 10/08/2022 CLINICAL DATA:  Mental status change.  I injury. EXAM: CT HEAD WITHOUT CONTRAST TECHNIQUE: Contiguous axial images were obtained from the base of the skull through the vertex without intravenous contrast. RADIATION DOSE REDUCTION: This exam was performed according to the departmental  dose-optimization program which includes automated exposure control, adjustment of the mA and/or kV according to patient size and/or use of iterative reconstruction technique. COMPARISON:  MRI brain 02/26/2017.  CT head 02/03/2022. FINDINGS: Brain: Extra-axial right parietal low-attenuation mass appears unchanged, favored as meningioma. There is no acute hemorrhage, infarct or extra-axial fluid collection. Again seen is moderate diffuse atrophy and mild periventricular white matter hypodensity, likely chronic small vessel ischemic change. Vascular: Atherosclerotic calcifications are present within the cavernous internal carotid arteries. Skull: Normal. Negative for fracture or focal lesion. Sinuses/Orbits: There is soft tissue swelling anterior to the right globe in there is mild deformity in the region of the anterior globe. Please see dedicated CT orbits for further description. The paranasal sinuses are clear. Other: None IMPRESSION: 1. No acute intracranial abnormality. 2. Stable right parietal extra-axial mass, favored as meningioma. 3. Soft tissue swelling anterior to the right globe with mild deformity in the region of the anterior globe.  Please see dedicated CT orbits for further description. Electronically Signed   By: Darliss Cheney M.D.   On: 10/08/2022 15:32   DG Chest Portable 1 View  Result Date: 10/08/2022 CLINICAL DATA:  Altered mental status. EXAM: PORTABLE CHEST 1 VIEW COMPARISON:  02/03/2022 FINDINGS: Low lung volumes.The cardiomediastinal contours are normal. The lungs are clear. Pulmonary vasculature is normal. No consolidation, pleural effusion, or pneumothorax. No acute osseous abnormalities are seen. IMPRESSION: No active disease. Electronically Signed   By: Narda Rutherford M.D.   On: 10/08/2022 15:16    Procedures Procedures    Medications Ordered in ED Medications  piperacillin-tazobactam (ZOSYN) IVPB 3.375 g (3.375 g Intravenous New Bag/Given 10/08/22 1553)  vancomycin  (VANCOCIN) IVPB 1000 mg/200 mL premix (has no administration in time range)  tetracaine (PONTOCAINE) 0.5 % ophthalmic solution 2 drop (2 drops Right Eye Given by Other 10/08/22 1549)  fluorescein ophthalmic strip 1 strip (1 strip Right Eye Given by Other 10/08/22 1550)    ED Course/ Medical Decision Making/ A&P                                 Medical Decision Making Amount and/or Complexity of Data Reviewed Labs: ordered. Radiology: ordered.  Risk Prescription drug management.    patient with reportedly mental status change.  Nonverbal at baseline time however.  Started on Keflex today but unknown why.  Difficult to get information.  Decreased mental status.  There was report that patient may be going towards hospice but unable to get a hold of family.  Discussed with ophthalmology and they state normally difficult exam but normally not swollen with drainage.  Has had previous cataract surgery on the right eye.  White count reassuring.  Urine does show potential infection.  Had been previously treated for UTI.  Reviewed previous cultures. Eventually able to get a hold of patient's husband.  Patient is not currently in hospice.  Would want treatment.  Did have 1 episode of low blood pressure.  Had improved on recheck.  However at that point with infection fluid bolus given and ordered blood cultures and lactic acid.  Discussed with pharmacist.  Will now give Zosyn and vancomycin to cover for potential UTI but also endophthalmitis.  Does have inflamed red swollen eye.  Does have corneal uptake at the inferior mass on the right cornea.  No drainage however.  Will discuss with ophthalmology.  CRITICAL CARE Performed by: Benjiman Core Total critical care time: 30 minutes Critical care time was exclusive of separately billable procedures and treating other patients. Critical care was necessary to treat or prevent imminent or life-threatening deterioration. Critical care was time spent  personally by me on the following activities: development of treatment plan with patient and/or surrogate as well as nursing, discussions with consultants, evaluation of patient's response to treatment, examination of patient, obtaining history from patient or surrogate, ordering and performing treatments and interventions, ordering and review of laboratory studies, ordering and review of radiographic studies, pulse oximetry and re-evaluation of patient's condition.   Care turned over to Dr Tegeler          Final Clinical Impression(s) / ED Diagnoses Final diagnoses:  Urinary tract infection without hematuria, site unspecified  Metabolic encephalopathy    Rx / DC Orders ED Discharge Orders     None         Benjiman Core, MD 10/08/22 [phone]

## 2022-10-08 NOTE — ED Notes (Signed)
[facility] called for transport. Given several hours for transport ETA.

## 2022-10-11 LAB — URINE CULTURE: Culture: >=100000 — AB

## 2022-10-12 DIAGNOSIS — N390 Urinary tract infection, site not specified: Secondary | ICD-10-CM | POA: Diagnosis not present

## 2022-10-12 DIAGNOSIS — H44001 Unspecified purulent endophthalmitis, right eye: Secondary | ICD-10-CM | POA: Diagnosis not present

## 2022-10-12 DIAGNOSIS — F02C3 Dementia in other diseases classified elsewhere, severe, with mood disturbance: Secondary | ICD-10-CM | POA: Diagnosis not present

## 2022-10-12 DIAGNOSIS — G301 Alzheimer's disease with late onset: Secondary | ICD-10-CM | POA: Diagnosis not present

## 2022-10-13 DIAGNOSIS — I10 Essential (primary) hypertension: Secondary | ICD-10-CM | POA: Diagnosis not present

## 2022-10-13 DIAGNOSIS — F331 Major depressive disorder, recurrent, moderate: Secondary | ICD-10-CM | POA: Diagnosis not present

## 2022-10-13 LAB — CULTURE, BLOOD (ROUTINE X 2): Culture: NO GROWTH

## 2022-10-19 DIAGNOSIS — H16071 Perforated corneal ulcer, right eye: Secondary | ICD-10-CM | POA: Diagnosis not present

## 2022-11-04 DIAGNOSIS — I10 Essential (primary) hypertension: Secondary | ICD-10-CM | POA: Diagnosis not present

## 2022-11-04 DIAGNOSIS — G301 Alzheimer's disease with late onset: Secondary | ICD-10-CM | POA: Diagnosis not present

## 2022-11-04 DIAGNOSIS — H401131 Primary open-angle glaucoma, bilateral, mild stage: Secondary | ICD-10-CM | POA: Diagnosis not present

## 2022-11-04 DIAGNOSIS — K5901 Slow transit constipation: Secondary | ICD-10-CM | POA: Diagnosis not present

## 2022-11-09 DIAGNOSIS — I10 Essential (primary) hypertension: Secondary | ICD-10-CM | POA: Diagnosis not present

## 2022-11-09 DIAGNOSIS — F5101 Primary insomnia: Secondary | ICD-10-CM | POA: Diagnosis not present

## 2022-11-09 DIAGNOSIS — F02818 Dementia in other diseases classified elsewhere, unspecified severity, with other behavioral disturbance: Secondary | ICD-10-CM | POA: Diagnosis not present

## 2022-11-09 DIAGNOSIS — G301 Alzheimer's disease with late onset: Secondary | ICD-10-CM | POA: Diagnosis not present

## 2022-11-12 DIAGNOSIS — F0390 Unspecified dementia without behavioral disturbance: Secondary | ICD-10-CM | POA: Diagnosis not present

## 2022-11-12 DIAGNOSIS — I10 Essential (primary) hypertension: Secondary | ICD-10-CM | POA: Diagnosis not present

## 2022-11-13 DIAGNOSIS — F5101 Primary insomnia: Secondary | ICD-10-CM | POA: Diagnosis not present

## 2022-11-13 DIAGNOSIS — F331 Major depressive disorder, recurrent, moderate: Secondary | ICD-10-CM | POA: Diagnosis not present

## 2022-11-13 DIAGNOSIS — F411 Generalized anxiety disorder: Secondary | ICD-10-CM | POA: Diagnosis not present

## 2022-11-13 DIAGNOSIS — G309 Alzheimer's disease, unspecified: Secondary | ICD-10-CM | POA: Diagnosis not present

## 2022-11-25 DIAGNOSIS — H44111 Panuveitis, right eye: Secondary | ICD-10-CM | POA: Diagnosis not present

## 2022-11-27 DIAGNOSIS — F331 Major depressive disorder, recurrent, moderate: Secondary | ICD-10-CM | POA: Diagnosis not present

## 2022-11-27 DIAGNOSIS — R451 Restlessness and agitation: Secondary | ICD-10-CM | POA: Diagnosis not present

## 2022-11-27 DIAGNOSIS — G309 Alzheimer's disease, unspecified: Secondary | ICD-10-CM | POA: Diagnosis not present

## 2022-11-27 DIAGNOSIS — F411 Generalized anxiety disorder: Secondary | ICD-10-CM | POA: Diagnosis not present

## 2022-12-10 DIAGNOSIS — E785 Hyperlipidemia, unspecified: Secondary | ICD-10-CM | POA: Diagnosis not present

## 2022-12-10 DIAGNOSIS — I10 Essential (primary) hypertension: Secondary | ICD-10-CM | POA: Diagnosis not present

## 2022-12-10 DIAGNOSIS — Z1329 Encounter for screening for other suspected endocrine disorder: Secondary | ICD-10-CM | POA: Diagnosis not present

## 2022-12-10 DIAGNOSIS — Z131 Encounter for screening for diabetes mellitus: Secondary | ICD-10-CM | POA: Diagnosis not present

## 2022-12-15 DIAGNOSIS — H1789 Other corneal scars and opacities: Secondary | ICD-10-CM | POA: Diagnosis not present

## 2023-03-03 DIAGNOSIS — H0100A Unspecified blepharitis right eye, upper and lower eyelids: Secondary | ICD-10-CM | POA: Diagnosis not present

## 2023-04-20 DEATH — deceased
